# Patient Record
Sex: Female | Born: 1945 | Race: White | Hispanic: No | Smoking: Current every day smoker
Health system: Southern US, Community
[De-identification: ages and names within clinical notes are randomized; demographics above are authoritative.]

## PROBLEM LIST (undated history)

## (undated) DIAGNOSIS — M810 Age-related osteoporosis without current pathological fracture: Secondary | ICD-10-CM

## (undated) DIAGNOSIS — H9190 Unspecified hearing loss, unspecified ear: Secondary | ICD-10-CM

## (undated) DIAGNOSIS — F329 Major depressive disorder, single episode, unspecified: Secondary | ICD-10-CM

## (undated) DIAGNOSIS — J3089 Other allergic rhinitis: Secondary | ICD-10-CM

## (undated) DIAGNOSIS — E119 Type 2 diabetes mellitus without complications: Secondary | ICD-10-CM

## (undated) DIAGNOSIS — F32A Depression, unspecified: Secondary | ICD-10-CM

## (undated) DIAGNOSIS — I10 Essential (primary) hypertension: Secondary | ICD-10-CM

## (undated) DIAGNOSIS — K219 Gastro-esophageal reflux disease without esophagitis: Secondary | ICD-10-CM

## (undated) DIAGNOSIS — R2689 Other abnormalities of gait and mobility: Secondary | ICD-10-CM

## (undated) DIAGNOSIS — E785 Hyperlipidemia, unspecified: Secondary | ICD-10-CM

## (undated) DIAGNOSIS — K5792 Diverticulitis of intestine, part unspecified, without perforation or abscess without bleeding: Secondary | ICD-10-CM

## (undated) DIAGNOSIS — M1990 Unspecified osteoarthritis, unspecified site: Secondary | ICD-10-CM

## (undated) DIAGNOSIS — R42 Dizziness and giddiness: Secondary | ICD-10-CM

## (undated) DIAGNOSIS — K589 Irritable bowel syndrome without diarrhea: Secondary | ICD-10-CM

## (undated) DIAGNOSIS — F419 Anxiety disorder, unspecified: Secondary | ICD-10-CM

## (undated) DIAGNOSIS — M5431 Sciatica, right side: Secondary | ICD-10-CM

## (undated) HISTORY — DX: Sciatica, right side: M54.31

## (undated) HISTORY — DX: Hyperlipidemia, unspecified: E78.5

## (undated) HISTORY — PX: NO PAST SURGERIES: SHX2092

## (undated) HISTORY — DX: Anxiety disorder, unspecified: F41.9

## (undated) HISTORY — DX: Diverticulitis of intestine, part unspecified, without perforation or abscess without bleeding: K57.92

## (undated) HISTORY — DX: Depression, unspecified: F32.A

## (undated) HISTORY — DX: Major depressive disorder, single episode, unspecified: F32.9

## (undated) HISTORY — DX: Essential (primary) hypertension: I10

---
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
## 2015-04-12 DIAGNOSIS — I10 Essential (primary) hypertension: Secondary | ICD-10-CM | POA: Diagnosis not present

## 2015-04-12 DIAGNOSIS — E782 Mixed hyperlipidemia: Secondary | ICD-10-CM | POA: Diagnosis not present

## 2015-04-12 DIAGNOSIS — F411 Generalized anxiety disorder: Secondary | ICD-10-CM | POA: Diagnosis not present

## 2015-05-28 DIAGNOSIS — S20162A Insect bite (nonvenomous) of breast, left breast, initial encounter: Secondary | ICD-10-CM | POA: Diagnosis not present

## 2015-05-28 DIAGNOSIS — I781 Nevus, non-neoplastic: Secondary | ICD-10-CM | POA: Diagnosis not present

## 2015-10-15 DIAGNOSIS — I10 Essential (primary) hypertension: Secondary | ICD-10-CM | POA: Diagnosis not present

## 2015-10-15 DIAGNOSIS — E782 Mixed hyperlipidemia: Secondary | ICD-10-CM | POA: Diagnosis not present

## 2015-10-15 DIAGNOSIS — F411 Generalized anxiety disorder: Secondary | ICD-10-CM | POA: Diagnosis not present

## 2016-03-06 DIAGNOSIS — I10 Essential (primary) hypertension: Secondary | ICD-10-CM | POA: Diagnosis not present

## 2016-03-06 DIAGNOSIS — F411 Generalized anxiety disorder: Secondary | ICD-10-CM | POA: Diagnosis not present

## 2016-06-04 DIAGNOSIS — F411 Generalized anxiety disorder: Secondary | ICD-10-CM | POA: Diagnosis not present

## 2016-06-04 DIAGNOSIS — I10 Essential (primary) hypertension: Secondary | ICD-10-CM | POA: Diagnosis not present

## 2016-06-04 DIAGNOSIS — R7309 Other abnormal glucose: Secondary | ICD-10-CM | POA: Diagnosis not present

## 2016-06-04 DIAGNOSIS — Z0000 Encounter for general adult medical examination without abnormal findings: Secondary | ICD-10-CM | POA: Diagnosis not present

## 2016-06-04 DIAGNOSIS — E782 Mixed hyperlipidemia: Secondary | ICD-10-CM | POA: Diagnosis not present

## 2016-06-05 LAB — CBC AND DIFFERENTIAL
HCT: 43 (ref 36–46)
Hemoglobin: 14.1 (ref 12.0–16.0)
Platelets: 200 (ref 150–399)
WBC: 4.7

## 2016-06-05 LAB — HEPATIC FUNCTION PANEL
ALK PHOS: 101 (ref 25–125)
ALT: 24 (ref 7–35)
AST: 28 (ref 13–35)
BILIRUBIN, TOTAL: 7.4

## 2016-06-05 LAB — BASIC METABOLIC PANEL
BUN: 4 (ref 4–21)
CREATININE: 0.6 (ref 0.5–1.1)
GLUCOSE: 93
Potassium: 4.3 (ref 3.4–5.3)
Sodium: 218 — AB (ref 137–147)

## 2016-06-05 LAB — VITAMIN D 25 HYDROXY (VIT D DEFICIENCY, FRACTURES): VIT D 25 HYDROXY: 49.1

## 2016-06-05 LAB — LIPID PANEL
Cholesterol: 197 (ref 0–200)
HDL: 108 — AB (ref 35–70)
LDL Cholesterol: 55
TRIGLYCERIDES: 91 (ref 40–160)

## 2016-06-05 LAB — TSH: TSH: 0.71 (ref 0.41–5.90)

## 2016-12-01 ENCOUNTER — Ambulatory Visit (INDEPENDENT_AMBULATORY_CARE_PROVIDER_SITE_OTHER): Payer: Medicare Other | Admitting: Family Medicine

## 2016-12-01 ENCOUNTER — Encounter: Payer: Self-pay | Admitting: Family Medicine

## 2016-12-01 VITALS — BP 142/102 | HR 92 | Temp 98.4°F | Resp 16 | Ht <= 58 in | Wt 94.0 lb

## 2016-12-01 DIAGNOSIS — Z720 Tobacco use: Secondary | ICD-10-CM | POA: Diagnosis not present

## 2016-12-01 DIAGNOSIS — F332 Major depressive disorder, recurrent severe without psychotic features: Secondary | ICD-10-CM

## 2016-12-01 DIAGNOSIS — F419 Anxiety disorder, unspecified: Secondary | ICD-10-CM

## 2016-12-01 DIAGNOSIS — Z23 Encounter for immunization: Secondary | ICD-10-CM

## 2016-12-01 DIAGNOSIS — Z1159 Encounter for screening for other viral diseases: Secondary | ICD-10-CM | POA: Diagnosis not present

## 2016-12-01 DIAGNOSIS — Z7689 Persons encountering health services in other specified circumstances: Secondary | ICD-10-CM

## 2016-12-01 DIAGNOSIS — F411 Generalized anxiety disorder: Secondary | ICD-10-CM | POA: Insufficient documentation

## 2016-12-01 DIAGNOSIS — I10 Essential (primary) hypertension: Secondary | ICD-10-CM | POA: Diagnosis not present

## 2016-12-01 DIAGNOSIS — E785 Hyperlipidemia, unspecified: Secondary | ICD-10-CM | POA: Diagnosis not present

## 2016-12-01 DIAGNOSIS — F329 Major depressive disorder, single episode, unspecified: Secondary | ICD-10-CM | POA: Insufficient documentation

## 2016-12-01 MED ORDER — ALPRAZOLAM 0.5 MG PO TABS: 0.2500 mg | ORAL_TABLET | Freq: Two times a day (BID) | ORAL | 1 refills | Status: DC | PRN

## 2016-12-01 MED ORDER — SIMVASTATIN 20 MG PO TABS: 20.0000 mg | ORAL_TABLET | Freq: Every day | ORAL | 1 refills | Status: DC

## 2016-12-01 MED ORDER — NICOTINE 14 MG/24HR TD PT24: 14.0000 mg | MEDICATED_PATCH | Freq: Every day | TRANSDERMAL | 1 refills | Status: DC

## 2016-12-01 MED ORDER — PAROXETINE HCL 10 MG PO TABS: ORAL_TABLET | ORAL | 1 refills | Status: DC

## 2016-12-01 NOTE — Patient Instructions (Addendum)
1-800-QUIT-NOW   Steps to Quit Smoking Smoking tobacco can be harmful to your health and can affect almost every organ in your body. Smoking puts you, and those around you, at risk for developing many serious chronic diseases. Quitting smoking is difficult, but it is one of the best things that you can do for your health. It is never too late to quit. What are the benefits of quitting smoking? When you quit smoking, you lower your risk of developing serious diseases and conditions, such as:  Lung cancer or lung disease, such as COPD.  Heart disease.  Stroke.  Heart attack.  Infertility.  Osteoporosis and bone fractures.  Additionally, symptoms such as coughing, wheezing, and shortness of breath may get better when you quit. You may also find that you get sick less often because your body is stronger at fighting off colds and infections. If you are pregnant, quitting smoking can help to reduce your chances of having a baby of low birth weight. How do I get ready to quit? When you decide to quit smoking, create a plan to make sure that you are successful. Before you quit:  Pick a date to quit. Set a date within the next two weeks to give you time to prepare.  Write down the reasons why you are quitting. Keep this list in places where you will see it often, such as on your bathroom mirror or in your car or wallet.  Identify the people, places, things, and activities that make you want to smoke (triggers) and avoid them. Make sure to take these actions: ? Throw away all cigarettes at home, at work, and in your car. ? Throw away smoking accessories, such as Scientist, research (medical). ? Clean your car and make sure to empty the ashtray. ? Clean your home, including curtains and carpets.  Tell your family, friends, and coworkers that you are quitting. Support from your loved ones can make quitting easier.  Talk with your health care provider about your options for quitting smoking.  Find  out what treatment options are covered by your health insurance.  What strategies can I use to quit smoking? Talk with your healthcare provider about different strategies to quit smoking. Some strategies include:  Quitting smoking altogether instead of gradually lessening how much you smoke over a period of time. Research shows that quitting "cold [place]" is more successful than gradually quitting.  Attending in-person counseling to help you build problem-solving skills. You are more likely to have success in quitting if you attend several counseling sessions. Even short sessions of 10 minutes can be effective.  Finding resources and support systems that can help you to quit smoking and remain smoke-free after you quit. These resources are most helpful when you use them often. They can include: ? Online chats with a Social worker. ? Telephone quitlines. ? Careers information officer. ? Support groups or group counseling. ? Text messaging programs. ? Mobile phone applications.  Taking medicines to help you quit smoking. (If you are pregnant or breastfeeding, talk with your health care provider first.) Some medicines contain nicotine and some do not. Both types of medicines help with cravings, but the medicines that include nicotine help to relieve withdrawal symptoms. Your health care provider may recommend: ? Nicotine patches, gum, or lozenges. ? Nicotine inhalers or sprays. ? Non-nicotine medicine that is taken by mouth.  Talk with your health care provider about combining strategies, such as taking medicines while you are also receiving in-person counseling. Using these  two strategies together makes you more likely to succeed in quitting than if you used either strategy on its own. If you are pregnant or breastfeeding, talk with your health care provider about finding counseling or other support strategies to quit smoking. Do not take medicine to help you quit smoking unless told to do so by  your health care provider. What things can I do to make it easier to quit? Quitting smoking might feel overwhelming at first, but there is a lot that you can do to make it easier. Take these important actions:  Reach out to your family and friends and ask that they support and encourage you during this time. Call telephone quitlines, reach out to support groups, or work with a counselor for support.  Ask people who smoke to avoid smoking around you.  Avoid places that trigger you to smoke, such as bars, parties, or smoke-break areas at work.  Spend time around people who do not smoke.  Lessen stress in your life, because stress can be a smoking trigger for some people. To lessen stress, try: ? Exercising regularly. ? Deep-breathing exercises. ? Yoga. ? Meditating. ? Performing a body scan. This involves closing your eyes, scanning your body from head to toe, and noticing which parts of your body are particularly tense. Purposefully relax the muscles in those areas.  Download or purchase mobile phone or tablet apps (applications) that can help you stick to your quit plan by providing reminders, tips, and encouragement. There are many free apps, such as QuitGuide from the [employer] Office manager for Disease Control and Prevention). You can find other support for quitting smoking (smoking cessation) through smokefree.gov and other websites.  How will I feel when I quit smoking? Within the first 24 hours of quitting smoking, you may start to feel some withdrawal symptoms. These symptoms are usually most noticeable 2-3 days after quitting, but they usually do not last beyond 2-3 weeks. Changes or symptoms that you might experience include:  Mood swings.  Restlessness, anxiety, or irritation.  Difficulty concentrating.  Dizziness.  Strong cravings for sugary foods in addition to nicotine.  Mild weight gain.  Constipation.  Nausea.  Coughing or a sore throat.  Changes in how your medicines  work in your body.  A depressed mood.  Difficulty sleeping (insomnia).  After the first 2-3 weeks of quitting, you may start to notice more positive results, such as:  Improved sense of smell and taste.  Decreased coughing and sore throat.  Slower heart rate.  Lower blood pressure.  Clearer skin.  The ability to breathe more easily.  Fewer sick days.  Quitting smoking is very challenging for most people. Do not get discouraged if you are not successful the first time. Some people need to make many attempts to quit before they achieve long-term success. Do your best to stick to your quit plan, and talk with your health care provider if you have any questions or concerns. This information is not intended to replace advice given to you by your health care provider. Make sure you discuss any questions you have with your health care provider. Document Released: 01/05/2001 Document Revised: 09/09/2015 Document Reviewed: 05/28/2014 Elsevier Interactive Patient Education  2017 [employer].
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
## 2016-12-02 NOTE — Assessment & Plan Note (Signed)
Uncontrolled, but may have some white coat component Continue lisinopril at current dose Recheck at next visit and consider dose titration

## 2016-12-02 NOTE — Assessment & Plan Note (Signed)
Poorly controlled D/c wellbutrin as this could be increasing anxiety Patient agrees to try an SSRI Discussed that it can take 6-8 weeks to reach maximum efficacy, potential side effects including sexual dysfunction, GI upset, risk of increased suicidality Patient is non-a danger to herself or others Check for other medical causes of depression, anxiety with TSH, CBC, CMP Follow-up in one month

## 2016-12-02 NOTE — Assessment & Plan Note (Signed)
Continue Zocor Recheck lipids, CMP

## 2016-12-02 NOTE — Assessment & Plan Note (Signed)
Long discussion about consequence of smoking, benefits of quitting, quitting strategies Patient currently on wellbutrin which will be d/c'd (see below) and has not helped with cessation Tried chantix in the past that caused nightmares Agrees to try patches Quitline number given F/u in 1 month

## 2016-12-14 DIAGNOSIS — I10 Essential (primary) hypertension: Secondary | ICD-10-CM | POA: Diagnosis not present

## 2016-12-14 DIAGNOSIS — E785 Hyperlipidemia, unspecified: Secondary | ICD-10-CM | POA: Diagnosis not present

## 2016-12-14 DIAGNOSIS — Z1159 Encounter for screening for other viral diseases: Secondary | ICD-10-CM | POA: Diagnosis not present

## 2016-12-14 DIAGNOSIS — F419 Anxiety disorder, unspecified: Secondary | ICD-10-CM | POA: Diagnosis not present

## 2016-12-15 ENCOUNTER — Telehealth: Payer: Self-pay | Admitting: Family Medicine

## 2016-12-15 LAB — CBC
HCT: 38.1 % (ref 35.0–45.0)
Hemoglobin: 13.2 g/dL (ref 11.7–15.5)
MCH: 30.9 pg (ref 27.0–33.0)
MCHC: 34.6 g/dL (ref 32.0–36.0)
MCV: 89.2 fL (ref 80.0–100.0)
MPV: 11.2 fL (ref 7.5–12.5)
PLATELETS: 209 10*3/uL (ref 140–400)
RBC: 4.27 10*6/uL (ref 3.80–5.10)
RDW: 12.1 % (ref 11.0–15.0)
WBC: 4.1 10*3/uL (ref 3.8–10.8)

## 2016-12-15 LAB — COMPLETE METABOLIC PANEL WITH GFR
AG RATIO: 1.5 (calc) (ref 1.0–2.5)
ALT: 18 U/L (ref 6–29)
AST: 18 U/L (ref 10–35)
Albumin: 4.2 g/dL (ref 3.6–5.1)
Alkaline phosphatase (APISO): 89 U/L (ref 33–130)
BUN: 8 mg/dL (ref 7–25)
CALCIUM: 9.2 mg/dL (ref 8.6–10.4)
CO2: 30 mmol/L (ref 20–32)
Chloride: 101 mmol/L (ref 98–110)
Creat: 0.62 mg/dL (ref 0.60–0.93)
GFR, EST NON AFRICAN AMERICAN: 90 mL/min/{1.73_m2} (ref >=60)
GFR, Est African American: 104 mL/min/{1.73_m2} (ref >=60)
Globulin: 2.8 g/dL (calc) (ref 1.9–3.7)
Glucose, Bld: 90 mg/dL (ref 65–99)
POTASSIUM: 4 mmol/L (ref 3.5–5.3)
Sodium: 139 mmol/L (ref 135–146)
Total Bilirubin: 0.7 mg/dL (ref 0.2–1.2)
Total Protein: 7 g/dL (ref 6.1–8.1)

## 2016-12-15 LAB — LIPID PANEL
CHOL/HDL RATIO: 2.5 (calc) (ref <5.0)
CHOLESTEROL: 199 mg/dL (ref <200)
HDL: 80 mg/dL (ref >50)
LDL CHOLESTEROL (CALC): 97 mg/dL
Non-HDL Cholesterol (Calc): 119 mg/dL (calc) (ref <130)
Triglycerides: 121 mg/dL (ref <150)

## 2016-12-15 LAB — HEPATITIS C ANTIBODY
HEP C AB: NONREACTIVE
SIGNAL TO CUT-OFF: 0.07 (ref <1.00)

## 2016-12-15 LAB — TSH: TSH: 0.71 m[IU]/L (ref 0.40–4.50)

## 2016-12-15 NOTE — Progress Notes (Signed)
Advised 

## 2016-12-15 NOTE — Telephone Encounter (Signed)
Pt returned call for lab results. Thanks TNP °

## 2017-01-03 ENCOUNTER — Ambulatory Visit: Payer: Self-pay | Admitting: Family Medicine

## 2017-01-20 ENCOUNTER — Other Ambulatory Visit: Payer: Self-pay

## 2017-01-20 ENCOUNTER — Encounter: Payer: Self-pay | Admitting: Family Medicine

## 2017-01-20 ENCOUNTER — Ambulatory Visit (INDEPENDENT_AMBULATORY_CARE_PROVIDER_SITE_OTHER): Payer: Medicare Other | Admitting: Family Medicine

## 2017-01-20 VITALS — BP 136/88 | HR 77 | Temp 98.1°F | Resp 16 | Wt 97.0 lb

## 2017-01-20 DIAGNOSIS — I10 Essential (primary) hypertension: Secondary | ICD-10-CM | POA: Diagnosis not present

## 2017-01-20 DIAGNOSIS — F419 Anxiety disorder, unspecified: Secondary | ICD-10-CM

## 2017-01-20 DIAGNOSIS — Z720 Tobacco use: Secondary | ICD-10-CM | POA: Diagnosis not present

## 2017-01-20 DIAGNOSIS — F332 Major depressive disorder, recurrent severe without psychotic features: Secondary | ICD-10-CM | POA: Diagnosis not present

## 2017-01-20 MED ORDER — PAROXETINE HCL 40 MG PO TABS
40.0000 mg | ORAL_TABLET | Freq: Every day | ORAL | 2 refills | Status: DC
Start: 2017-01-20 — End: 2017-03-07

## 2017-01-20 MED ORDER — ALPRAZOLAM 0.5 MG PO TABS: 0.2500 mg | ORAL_TABLET | Freq: Two times a day (BID) | ORAL | 1 refills | Status: DC | PRN

## 2017-01-20 NOTE — Patient Instructions (Addendum)
Increase Paxil to 40mg  daily No changes to other medicines  Insomnia Insomnia is a sleep disorder that makes it difficult to fall asleep or to stay asleep. Insomnia can cause tiredness (fatigue), low energy, difficulty concentrating, mood swings, and poor performance at work or school. There are three different ways to classify insomnia:  Difficulty falling asleep.  Difficulty staying asleep.  Waking up too early in the morning.  Any type of insomnia can be long-term (chronic) or short-term (acute). Both are common. Short-term insomnia usually lasts for three months or less. Chronic insomnia occurs at least three times a week for longer than three months. What are the causes? Insomnia may be caused by another condition, situation, or substance, such as:  Anxiety.  Certain medicines.  Gastroesophageal reflux disease (GERD) or other gastrointestinal conditions.  Asthma or other breathing conditions.  Restless legs syndrome, sleep apnea, or other sleep disorders.  Chronic pain.  Menopause. This may include hot flashes.  Stroke.  Abuse of alcohol, tobacco, or illegal drugs.  Depression.  Caffeine.  Neurological disorders, such as Alzheimer disease.  An overactive thyroid (hyperthyroidism).  The cause of insomnia may not be known. What increases the risk? Risk factors for insomnia include:  Gender. Women are more commonly affected than men.  Age. Insomnia is more common as you get older.  Stress. This may involve your professional or personal life.  Income. Insomnia is more common in people with lower income.  Lack of exercise.  Irregular work schedule or night shifts.  Traveling between different time zones.  What are the signs or symptoms? If you have insomnia, trouble falling asleep or trouble staying asleep is the main symptom. This may lead to other symptoms, such as:  Feeling fatigued.  Feeling nervous about going to sleep.  Not feeling rested in  the morning.  Having trouble concentrating.  Feeling irritable, anxious, or depressed.  How is this treated? Treatment for insomnia depends on the cause. If your insomnia is caused by an underlying condition, treatment will focus on addressing the condition. Treatment may also include:  Medicines to help you sleep.  Counseling or therapy.  Lifestyle adjustments.  Follow these instructions at home:  Take medicines only as directed by your health care provider.  Keep regular sleeping and waking hours. Avoid naps.  Keep a sleep diary to help you and your health care provider figure out what could be causing your insomnia. Include: ? When you sleep. ? When you wake up during the night. ? How well you sleep. ? How rested you feel the next day. ? Any side effects of medicines you are taking. ? What you eat and drink.  Make your bedroom a comfortable place where it is easy to fall asleep: ? Put up shades or special blackout curtains to block light from outside. ? Use a white noise machine to block noise. ? Keep the temperature cool.  Exercise regularly as directed by your health care provider. Avoid exercising right before bedtime.  Use relaxation techniques to manage stress. Ask your health care provider to suggest some techniques that may work well for you. These may include: ? Breathing exercises. ? Routines to release muscle tension. ? Visualizing peaceful scenes.  Cut back on alcohol, caffeinated beverages, and cigarettes, especially close to bedtime. These can disrupt your sleep.  Do not overeat or eat spicy foods right before bedtime. This can lead to digestive discomfort that can make it hard for you to sleep.  Limit screen use before bedtime.  This includes: ? Watching TV. ? Using your smartphone, tablet, and computer.  Stick to a routine. This can help you fall asleep faster. Try to do a quiet activity, brush your teeth, and go to bed at the same time each  night.  Get out of bed if you are still awake after 15 minutes of trying to sleep. Keep the lights down, but try reading or doing a quiet activity. When you feel sleepy, go back to bed.  Make sure that you drive carefully. Avoid driving if you feel very sleepy.  Keep all follow-up appointments as directed by your health care provider. This is important. Contact a health care provider if:  You are tired throughout the day or have trouble in your daily routine due to sleepiness.  You continue to have sleep problems or your sleep problems get worse. Get help right away if:  You have serious thoughts about hurting yourself or someone else. This information is not intended to replace advice given to you by your health care provider. Make sure you discuss any questions you have with your health care provider. Document Released: 01/09/2000 Document Revised: 06/13/2015 Document Reviewed: 10/12/2013 Elsevier Interactive Patient Education  [employer].

## 2017-01-20 NOTE — Assessment & Plan Note (Signed)
Congratulated patient on smoking cessation efforts Has cut back significantly on tobacco intake and wants to quit by time of f/u

## 2017-01-20 NOTE — Assessment & Plan Note (Signed)
Well controlled today Continue lisinopril Suspect some component of white coat HTN at last visit Recent BMP wnl

## 2017-01-20 NOTE — Assessment & Plan Note (Signed)
Control improving on Paxil No side effects from paxil Recent labs wnl Increase Paxil to 40mg  daily as above Discussed long term risks of chronic benzos Will continue Xanax currently, with plans to decrease and d/c in the future if well controlled on SSRI Would likely benefit from therapy as well

## 2017-01-20 NOTE — Progress Notes (Signed)
Patient: Amanda Tucker Female    DOB: Jan 20, 1946   71 y.o.   MRN: [id number] Visit Date: 01/20/2017  Today's Provider: [name], MD   I, Martha Clan, CMA, am acting as scribe for [name], MD.  Chief Complaint  Patient presents with  . Hypertension  . Depression  . Nicotine Dependence   Subjective:    HPI      Hypertension, follow-up:  BP Readings from Last 3 Encounters:  01/20/17 136/88  12/01/16 (!) 142/102    She was last seen for hypertension 4 weeks ago.  BP at that visit was 142/102. Management since that visit includes none; was believed to be elevated due to white coat syndrome. BP going to be rechecked today. If still elevated, PCP wanted to titrate dose of Lisinopril. She is exercising. Yoga, stretches, frequent stair climbing. She is adherent to low salt diet.   Outside blood pressures are WNL per pt. She is experiencing none.  Patient denies chest pain, chest pressure/discomfort, claudication, dyspnea, exertional chest pressure/discomfort, fatigue, irregular heart beat, lower extremity edema, near-syncope, orthopnea, palpitations and syncope.   Cardiovascular risk factors include advanced age (older than 34 for men, 64 for women), hypertension and smoking/ tobacco exposure.  Use of agents associated with hypertension: none.     Weight trend: increasing steadily Wt Readings from Last 3 Encounters:  01/20/17 97 lb (44 kg)  12/01/16 94 lb (42.6 kg)    Current diet: "not a heavy eater"  ------------------------------------------------------------------------  Depression, Follow-up  She  was last seen for this 4 weeks ago. Changes made at last visit include D/C Wellbutrin, as this was believed to be worsening her anxiety. Pt was started on Paxil.   She reports good compliance with treatment. She is not having side effects.   She reports good tolerance of treatment. Current symptoms include: insomnia She feels she is  Improved since last visit.  Pt states her anxiety is also improved, but she is still having "anxiety attacks", though she denies panic. She is taking 1/2 to 1 Alprazolam qhs to improve sleep. She also took this medication today during an anxiety attack.  Has tried several OTC sleep aides (Advil PM, Tylenol PM, etc) - haven't helped with sleep.  Xanax seemed to help with her sleep.    Depression screen [facility] 2/9 01/20/2017 12/01/2016  Decreased Interest 0 1  Down, Depressed, Hopeless 1 2  PHQ - 2 Score 1 3  Altered sleeping 3 2  Tired, decreased energy 1 1  Change in appetite 1 2  Feeling bad or failure about yourself  1 2  Trouble concentrating 0 0  Moving slowly or fidgety/restless 1 2  Suicidal thoughts 0 0  PHQ-9 Score 8 12  Difficult doing work/chores Somewhat difficult Not difficult at all   GAD 7 : Generalized Anxiety Score 01/20/2017 12/01/2016  Nervous, Anxious, on Edge 1 2  Control/stop worrying 1 2  Worry too much - different things 1 3  Trouble relaxing 1 3  Restless 0 3  Easily annoyed or irritable 0 0  Afraid - awful might happen 0 3  Total GAD 7 Score 4 16  Anxiety Difficulty Not difficult at all Somewhat difficult    ------------------------------------------------------------------------   Follow up for Tobacco Abuse  The patient was last seen for this 4 weeks ago. Changes made at last visit include advising pt to use nicotine patch. Wellbutrin was ineffective and Chantix caused nightmares.  She reports excellent compliance with treatment.  Is cutting down to level 3 of nicotine patches tomorrow. Has cut the habit of her morning cigarette by doing yoga and staying in room longer in the mornings. She feels that condition is Improved. She is now only smoking 2-3 cigarettes per day (down from 1 PPD) and not smoking entire cigarette at a time. She is not having side effects.    ------------------------------------------------------------------------------------    No Known Allergies   Current Outpatient Medications:  .  ALPRAZolam (XANAX) 0.5 MG tablet, Take 0.5-1 tablets (0.25-0.5 mg total) by mouth 2 (two) times daily as needed for anxiety., Disp: 30 tablet, Rfl: 1 .  aspirin EC 81 MG tablet, Take 81 mg daily by mouth., Disp: , Rfl:  .  lisinopril (PRINIVIL,ZESTRIL) 20 MG tablet, TK 1 T PO ONCE D, Disp: , Rfl: 1 .  nicotine (NICODERM CQ - DOSED IN MG/24 HOURS) 14 mg/24hr patch, Place 1 patch (14 mg total) daily onto the skin., Disp: 28 patch, Rfl: 1 .  PARoxetine (PAXIL) 40 MG tablet, Take 1 tablet (40 mg total) by mouth daily., Disp: 30 tablet, Rfl: 2 .  simvastatin (ZOCOR) 20 MG tablet, Take 1 tablet (20 mg total) daily at 6 PM by mouth., Disp: 90 tablet, Rfl: 1  Review of Systems  Constitutional: Negative for fatigue.  Respiratory: Negative for shortness of breath.   Cardiovascular: Negative for chest pain, palpitations and leg swelling.  Psychiatric/Behavioral: Positive for sleep disturbance. Negative for confusion and decreased concentration. The patient is nervous/anxious.     Social History   Tobacco Use  . Smoking status: Current Every Day Smoker    Packs/day: 0.25    Years: 54.00    Pack years: 13.50    Types: Cigarettes  . Smokeless tobacco: Never Used  . Tobacco comment: started smoking at age 39; smoking 2-3 cigarettes per day  Substance Use Topics  . Alcohol use: Yes    Alcohol/week: 3.0 oz    Types: 5 Glasses of wine per week   Objective:   BP 136/88 (BP Location: Left Arm, Patient Position: Sitting, Cuff Size: Normal)   Pulse 77   Temp 98.1 F (36.7 C) (Oral)   Resp 16   Wt 97 lb (44 kg)   SpO2 99%   BMI 21.75 kg/m  Vitals:   01/20/17 1605  BP: 136/88  Pulse: 77  Resp: 16  Temp: 98.1 F (36.7 C)  TempSrc: Oral  SpO2: 99%  Weight: 97 lb (44 kg)     Physical Exam  Constitutional: She is oriented to person,  place, and time. She appears well-developed and well-nourished. No distress.  HENT:  Head: Normocephalic and atraumatic.  Neck: Neck supple. No thyromegaly present.  Cardiovascular: Normal rate, regular rhythm, normal heart sounds and intact distal pulses.  No murmur heard. Pulmonary/Chest: Effort normal and breath sounds normal. No respiratory distress. She has no wheezes. She has no rales.  Musculoskeletal: She exhibits no edema or deformity.  Lymphadenopathy:    She has no cervical adenopathy.  Neurological: She is alert and oriented to person, place, and time. No cranial nerve deficit.  Skin: Skin is warm and dry. No rash noted.  Psychiatric: She has a normal mood and affect. Her behavior is normal.  Vitals reviewed.       Assessment & Plan:      Problem List Items Addressed This Visit      Cardiovascular and Mediastinum   Hypertension    Well controlled today Continue lisinopril Suspect some component of white coat  HTN at last visit Recent BMP wnl        Other   Depression - Primary    Control improving off welbutrin on Paxil Doing well on Paxil without side effects Will increase Paxil to 40mg  daily F/u in 6 weeks      Relevant Medications   PARoxetine (PAXIL) 40 MG tablet   ALPRAZolam (XANAX) 0.5 MG tablet   Anxiety    Control improving on Paxil No side effects from paxil Recent labs wnl Increase Paxil to 40mg  daily as above Discussed long term risks of chronic benzos Will continue Xanax currently, with plans to decrease and d/c in the future if well controlled on SSRI Would likely benefit from therapy as well      Relevant Medications   PARoxetine (PAXIL) 40 MG tablet   ALPRAZolam (XANAX) 0.5 MG tablet   Tobacco abuse    Congratulated patient on smoking cessation efforts Has cut back significantly on tobacco intake and wants to quit by time of f/u         Return in about 6 weeks (around 03/03/2017) for insomnia, anxiety.     The entirety of the  information documented in the History of Present Illness, Review of Systems and Physical Exam were personally obtained by me. Portions of this information were initially documented by Raquel Sarna Ratchford, CMA and reviewed by me for thoroughness and accuracy.    Virginia Crews, MD, MPH [facility] 01/20/2017 4:48 PM

## 2017-01-20 NOTE — Assessment & Plan Note (Signed)
Control improving off welbutrin on Paxil Doing well on Paxil without side effects Will increase Paxil to 40mg  daily F/u in 6 weeks

## 2017-03-07 ENCOUNTER — Ambulatory Visit (INDEPENDENT_AMBULATORY_CARE_PROVIDER_SITE_OTHER): Payer: Medicare Other | Admitting: Family Medicine

## 2017-03-07 ENCOUNTER — Encounter: Payer: Self-pay | Admitting: Family Medicine

## 2017-03-07 VITALS — BP 140/90 | HR 79 | Temp 98.1°F | Resp 16 | Wt 98.0 lb

## 2017-03-07 DIAGNOSIS — F331 Major depressive disorder, recurrent, moderate: Secondary | ICD-10-CM | POA: Diagnosis not present

## 2017-03-07 DIAGNOSIS — I10 Essential (primary) hypertension: Secondary | ICD-10-CM | POA: Diagnosis not present

## 2017-03-07 DIAGNOSIS — F419 Anxiety disorder, unspecified: Secondary | ICD-10-CM

## 2017-03-07 DIAGNOSIS — G4700 Insomnia, unspecified: Secondary | ICD-10-CM | POA: Diagnosis not present

## 2017-03-07 MED ORDER — PAROXETINE HCL 40 MG PO TABS: 40.0000 mg | ORAL_TABLET | Freq: Every day | ORAL | 3 refills | Status: DC

## 2017-03-07 MED ORDER — ALPRAZOLAM 0.5 MG PO TABS: 0.2500 mg | ORAL_TABLET | Freq: Every evening | ORAL | 2 refills | Status: DC | PRN

## 2017-03-07 NOTE — Assessment & Plan Note (Signed)
Much improved, well controlled Continue paxil at current dose F/u in 3 months

## 2017-03-07 NOTE — Progress Notes (Signed)
Patient: Amanda Tucker Female    DOB: 06-Feb-1945   72 y.o.   MRN: [id number] Visit Date: 03/07/2017  Today's Provider: [name], MD   I, Martha Clan, CMA, am acting as scribe for [name], MD.  Chief Complaint  Patient presents with  . Depression  . Anxiety   Subjective:    Depression         Chronicity: FU from 01/20/2017; depression was improving off of Wellbutrin and on Paxil. Paxil was increased to 40 mg at [place].  The problem has been gradually improving since onset.  Associated symptoms include fatigue, insomnia (well controlled on Xanax (takes 1 tab po qhs PRN for this. States she takes this "most nights")  ), appetite change (alternates between decreased and increased appetite) and headaches (intermittent).  Associated symptoms include no decreased concentration, no helplessness, no hopelessness, not irritable, no restlessness, no decreased interest, no body aches, no myalgias, no indigestion, not sad and no suicidal ideas.  Past treatments include SSRIs - Selective serotonin reuptake inhibitors.  Compliance with treatment is good.  Previous treatment provided significant relief.  Past medical history includes anxiety.   Anxiety  Presents for follow-up (LOV 01/20/2017. Pt was advised to continue Xanax with plans to D/C the benzo if she does well on SSRI) visit. Symptoms include insomnia (well controlled on Xanax (takes 1 tab po qhs PRN for this. States she takes this "most nights")  ). Patient reports no chest pain, decreased concentration, depressed mood, dizziness, excessive worry, feeling of choking, irritability, muscle tension, nervous/anxious behavior, palpitations, panic, restlessness, shortness of breath or suicidal ideas. Current severity: mild to moderate.   Compliance with medications: good compliance. Treatment side effects: no side effects.   Patient states her depression and anxiety are much improved, as well as her concentration.  She does need  Xanax most nights for sleep, but does not need it for anxiety anymore.    Depression screen [facility] 2/9 03/07/2017 01/20/2017 12/01/2016  Decreased Interest 0 0 1  Down, Depressed, Hopeless 0 1 2  PHQ - 2 Score 0 1 3  Altered sleeping 3 3 2   Tired, decreased energy 1 1 1   Change in appetite 1 1 2   Feeling bad or failure about yourself  1 1 2   Trouble concentrating 0 0 0  Moving slowly or fidgety/restless 0 1 2  Suicidal thoughts 0 0 0  PHQ-9 Score 6 8 12   Difficult doing work/chores Not difficult at all Somewhat difficult Not difficult at all    GAD 7 : Generalized Anxiety Score 03/07/2017 01/20/2017 12/01/2016  Nervous, Anxious, on Edge 0 1 2  Control/stop worrying 0 1 2  Worry too much - different things 0 1 3  Trouble relaxing 1 1 3   Restless 0 0 3  Easily annoyed or irritable 0 0 0  Afraid - awful might happen 1 0 3  Total GAD 7 Score 2 4 16   Anxiety Difficulty Not difficult at all Not difficult at all Somewhat difficult    HTN: - Medications: lisinopril 20mg  daily - Compliance: good - Checking BP at home: no - Denies any SOB, CP, vision changes, LE edema, medication SEs, or symptoms of hypotension - Diet: realized new sources of hidden sodium in her diet.  She is working on cutting back on sodium intake. - Exercise: no formal exercise  No Known Allergies   Current Outpatient Medications:  .  ALPRAZolam (XANAX) 0.5 MG tablet, Take 0.5-1 tablets (0.25-0.5 mg total)  by mouth at bedtime as needed for anxiety., Disp: 30 tablet, Rfl: 2 .  aspirin EC 81 MG tablet, Take 81 mg daily by mouth., Disp: , Rfl:  .  aspirin-acetaminophen-caffeine (EXCEDRIN MIGRAINE) 250-250-65 MG tablet, Take 2 tablets by mouth as needed for headache., Disp: , Rfl:  .  lisinopril (PRINIVIL,ZESTRIL) 20 MG tablet, TK 1 T PO ONCE D, Disp: , Rfl: 1 .  nicotine (NICODERM CQ - DOSED IN MG/24 HOURS) 14 mg/24hr patch, Place 1 patch (14 mg total) daily onto the skin., Disp: 28 patch, Rfl: 1 .  PARoxetine (PAXIL)  40 MG tablet, Take 1 tablet (40 mg total) by mouth daily., Disp: 90 tablet, Rfl: 3 .  simvastatin (ZOCOR) 20 MG tablet, Take 1 tablet (20 mg total) daily at 6 PM by mouth., Disp: 90 tablet, Rfl: 1  Review of Systems  Constitutional: Positive for appetite change (alternates between decreased and increased appetite) and fatigue. Negative for irritability.  Respiratory: Negative for shortness of breath.   Cardiovascular: Negative for chest pain and palpitations.  Musculoskeletal: Negative for myalgias.  Neurological: Positive for headaches (intermittent). Negative for dizziness.  Psychiatric/Behavioral: Positive for depression. Negative for decreased concentration and suicidal ideas. The patient has insomnia (well controlled on Xanax (takes 1 tab po qhs PRN for this. States she takes this "most nights")  ). The patient is not nervous/anxious.     Social History   Tobacco Use  . Smoking status: Current Every Day Smoker    Packs/day: 0.25    Years: 54.00    Pack years: 13.50    Types: Cigarettes  . Smokeless tobacco: Never Used  . Tobacco comment: started smoking at age 89; smoking about 1 cigarettes throughout the day  Substance Use Topics  . Alcohol use: Yes    Alcohol/week: 3.0 oz    Types: 5 Glasses of wine per week   Objective:   BP 140/90 (BP Location: Right Arm, Patient Position: Sitting, Cuff Size: Normal)   Pulse 79   Temp 98.1 F (36.7 C) (Oral)   Resp 16   Wt 98 lb (44.5 kg)   SpO2 96%   BMI 21.97 kg/m  Vitals:   03/07/17 1104  BP: 140/90  Pulse: 79  Resp: 16  Temp: 98.1 F (36.7 C)  TempSrc: Oral  SpO2: 96%  Weight: 98 lb (44.5 kg)     Physical Exam  Constitutional: She is oriented to person, place, and time. She appears well-developed and well-nourished. She is not irritable. No distress.  HENT:  Head: Normocephalic and atraumatic.  Mouth/Throat: Oropharynx is clear and moist.  Eyes: Conjunctivae are normal. No scleral icterus.  Cardiovascular: Normal  rate, regular rhythm, normal heart sounds and intact distal pulses.  No murmur heard. Pulmonary/Chest: Effort normal and breath sounds normal. No respiratory distress. She has no wheezes. She has no rales.  Musculoskeletal: She exhibits no edema or deformity.  Neurological: She is alert and oriented to person, place, and time.  Skin: Skin is warm and dry. No rash noted.  Psychiatric: She has a normal mood and affect. Her behavior is normal. Judgment and thought content normal.  Vitals reviewed.       Assessment & Plan:      Problem List Items Addressed This Visit      Cardiovascular and Mediastinum   Hypertension    Slightly above goal Patient to work on decreasing sodium intake Will continue lisinopril at current dose  Repeat labs at next visit F/u in 3 months  Other   Depression - Primary    Much improved, well controlled Continue paxil at current dose F/u in 3 months      Relevant Medications   ALPRAZolam (XANAX) 0.5 MG tablet   PARoxetine (PAXIL) 40 MG tablet   Anxiety    Much improved, well controlled Continue paxil at current dose F/u in 3 months See below for discussion of benzo therapy      Relevant Medications   ALPRAZolam (XANAX) 0.5 MG tablet   PARoxetine (PAXIL) 40 MG tablet   Insomnia    Well controlled Patient knows risks of continued benzo use She is taking low dose Xanax 0.25-0.5mg  qhs prn - most nights Discussed risks associated with benzo use in the elderly She declines other therapy options at this time Will continue current Xanax dose without escalation in the future          Return in about 3 months (around 06/04/2017) for chronic disease f/u.   The entirety of the information documented in the History of Present Illness, Review of Systems and Physical Exam were personally obtained by me. Portions of this information were initially documented by Raquel Sarna Ratchford, CMA and reviewed by me for thoroughness and accuracy.     Virginia Crews, MD, MPH [facility] 03/07/2017 1:41 PM

## 2017-03-07 NOTE — Assessment & Plan Note (Signed)
Well controlled Patient knows risks of continued benzo use She is taking low dose Xanax 0.25-0.5mg  qhs prn - most nights Discussed risks associated with benzo use in the elderly She declines other therapy options at this time Will continue current Xanax dose without escalation in the future

## 2017-03-07 NOTE — Assessment & Plan Note (Signed)
Slightly above goal Patient to work on decreasing sodium intake Will continue lisinopril at current dose  Repeat labs at next visit F/u in 3 months

## 2017-03-07 NOTE — Assessment & Plan Note (Signed)
Much improved, well controlled Continue paxil at current dose F/u in 3 months See below for discussion of benzo therapy

## 2017-05-03 ENCOUNTER — Telehealth: Payer: Self-pay | Admitting: Family Medicine

## 2017-05-03 NOTE — Telephone Encounter (Signed)
Faxed ROI to Dr Roland Rack in [place] on 12/08/16

## 2017-06-06 ENCOUNTER — Ambulatory Visit (INDEPENDENT_AMBULATORY_CARE_PROVIDER_SITE_OTHER): Payer: Medicare Other | Admitting: Family Medicine

## 2017-06-06 ENCOUNTER — Encounter: Payer: Self-pay | Admitting: Family Medicine

## 2017-06-06 VITALS — BP 130/80 | HR 83 | Temp 98.4°F | Resp 16 | Wt 98.0 lb

## 2017-06-06 DIAGNOSIS — F419 Anxiety disorder, unspecified: Secondary | ICD-10-CM

## 2017-06-06 DIAGNOSIS — M7121 Synovial cyst of popliteal space [Baker], right knee: Secondary | ICD-10-CM

## 2017-06-06 DIAGNOSIS — I10 Essential (primary) hypertension: Secondary | ICD-10-CM

## 2017-06-06 DIAGNOSIS — F331 Major depressive disorder, recurrent, moderate: Secondary | ICD-10-CM

## 2017-06-06 DIAGNOSIS — G4700 Insomnia, unspecified: Secondary | ICD-10-CM

## 2017-06-06 MED ORDER — SIMVASTATIN 20 MG PO TABS: 20.0000 mg | ORAL_TABLET | Freq: Every day | ORAL | 3 refills | Status: DC

## 2017-06-06 NOTE — Assessment & Plan Note (Signed)
Much improved, well controlled Continue Paxil at current dose Follow-up in 6 months See below for discussion of benzo therapy

## 2017-06-06 NOTE — Assessment & Plan Note (Signed)
Right knee with palpable Baker's cyst Likely secondary to some arthritis given difficulty with deep knee squats Offered x-rays to evaluate degree of osteoarthritis, but patient declines at this time Discussed anti-inflammatories as needed, ice, bracing Return precautions discussed

## 2017-06-06 NOTE — Progress Notes (Signed)
Patient: Amanda Tucker Female    DOB: Jun 11, 1945   72 y.o.   MRN: [id number] Visit Date: 06/06/2017  Today's Provider: [name], MD   Chief Complaint  Patient presents with  . Hypertension  . Depression   Subjective:    HPI      Hypertension, follow-up:  BP Readings from Last 3 Encounters:  06/06/17 130/80  03/07/17 140/90  01/20/17 136/88    She was last seen for hypertension 5 months ago.  BP at that visit was 136/88. Management since that visit includes continuing lisinopril. She reports good compliance with treatment. She is not having side effects.  She is exercising "a little, but not as much as I should. She is adherent to low salt diet.   Outside blood pressures are occasionally being checked. It was 160/90 this morning. It was 130/80 in office. She is experiencing fatigue and lower extremity edema.  Patient denies chest pain, chest pressure/discomfort, claudication, dyspnea, exertional chest pressure/discomfort, irregular heart beat, near-syncope, orthopnea, palpitations and syncope.   Cardiovascular risk factors include advanced age (older than 63 for men, 7 for women), dyslipidemia, hypertension and smoking/ tobacco exposure.   Pt has increased her cigarette use to 3 cigarettes daily. She has tried the Nicotine patches, but the higher dose patches causes irritability. She tolerates the 14 mg/24 hr patch, but she has difficulty finding this dose.   Use of agents associated with hypertension: none.     Weight trend: stable Wt Readings from Last 3 Encounters:  06/06/17 98 lb (44.5 kg)  03/07/17 98 lb (44.5 kg)  01/20/17 97 lb (44 kg)    Current diet: improving; pt is not eating out as much, and is avoiding frozen meals as much as possible.  ------------------------------------------------------------------------  Depression, Follow-up  She  was last seen for this 3 months ago. Changes made at last visit include continuing Paxil and  alprazolam for anxiety/insomnia.   She reports good compliance with treatment. She is not having side effects.  Was able to decrease from 0.5 to 0.25mg  qhs of Xanax.  She will even skip a dose some nights.  She reports good tolerance of treatment. Current symptoms include: fatigue She feels she is Improved since last visit.  ------------------------------------------------------------------------ R knee pain: Patient reports intermittent right knee pain for 2 to 3 months.  The pain is generalized over the entire knee and the back of the knee.  She noticed some popping while she was walking and then swelling along the backside of her knee.  She says there is no pain but is uncomfortable sometimes.  It is more uncomfortable when she is getting out of the car doing deep squats.  She denies any instability, clicking, locking, falls stiffness, fevers, redness.   No Known Allergies   Current Outpatient Medications:  .  ALPRAZolam (XANAX) 0.5 MG tablet, Take 0.5-1 tablets (0.25-0.5 mg total) by mouth at bedtime as needed for anxiety., Disp: 30 tablet, Rfl: 2 .  aspirin EC 81 MG tablet, Take 81 mg daily by mouth., Disp: , Rfl:  .  aspirin-acetaminophen-caffeine (EXCEDRIN MIGRAINE) 250-250-65 MG tablet, Take 2 tablets by mouth as needed for headache., Disp: , Rfl:  .  lisinopril (PRINIVIL,ZESTRIL) 20 MG tablet, TK 1 T PO ONCE D, Disp: , Rfl: 1 .  PARoxetine (PAXIL) 40 MG tablet, Take 1 tablet (40 mg total) by mouth daily., Disp: 90 tablet, Rfl: 3 .  simvastatin (ZOCOR) 20 MG tablet, Take 1 tablet (20 mg total) daily at  6 PM by mouth., Disp: 90 tablet, Rfl: 1  Review of Systems  Constitutional: Positive for fatigue. Negative for activity change, appetite change, chills, diaphoresis, fever and unexpected weight change.  Respiratory: Negative for shortness of breath.   Cardiovascular: Negative for chest pain, palpitations and leg swelling.  Musculoskeletal: Positive for arthralgias (knee).    Psychiatric/Behavioral: Negative for agitation, confusion, decreased concentration, self-injury, sleep disturbance and suicidal ideas. The patient is nervous/anxious.     Social History   Tobacco Use  . Smoking status: Current Every Day Smoker    Packs/day: 0.25    Years: 54.00    Pack years: 13.50    Types: Cigarettes  . Smokeless tobacco: Never Used  . Tobacco comment: started smoking at age 34; smoking about 3 cigarettes throughout the day  Substance Use Topics  . Alcohol use: Yes    Alcohol/week: 3.0 oz    Types: 5 Glasses of wine per week   Objective:   BP 130/80 (BP Location: Left Arm, Patient Position: Sitting, Cuff Size: Normal)   Pulse 83   Temp 98.4 F (36.9 C) (Oral)   Resp 16   Wt 98 lb (44.5 kg)   SpO2 96%   BMI 21.97 kg/m  Vitals:   06/06/17 1056  BP: 130/80  Pulse: 83  Resp: 16  Temp: 98.4 F (36.9 C)  TempSrc: Oral  SpO2: 96%  Weight: 98 lb (44.5 kg)     Physical Exam  Constitutional: She is oriented to person, place, and time. She appears well-developed and well-nourished. No distress.  HENT:  Head: Normocephalic and atraumatic.  Eyes: Conjunctivae are normal. No scleral icterus.  Neck: Neck supple. No thyromegaly present.  Cardiovascular: Normal rate, regular rhythm, normal heart sounds and intact distal pulses.  No murmur heard. Pulmonary/Chest: Effort normal and breath sounds normal. No respiratory distress. She has no wheezes. She has no rales.  Musculoskeletal: She exhibits no edema.  R Knee: No erythema.  Mild swelling.  Palpable swelling along popliteal fossa. Palpation normal with no warmth or joint line tenderness or patellar tenderness or condyle tenderness. ROM normal in flexion and extension and lower leg rotation. Ligaments with solid consistent endpoints including ACL, PCL, LCL, MCL. Negative Mcmurray's and provocative meniscal tests. Non painful patellar compression. Patellar and quadriceps tendons unremarkable. Hamstring and  quadriceps strength is normal. When showing how the pain comes about, she does a very deep squat and then stands up and stands on her left leg while raising her right foot up to her nose  Lymphadenopathy:    She has no cervical adenopathy.  Neurological: She is alert and oriented to person, place, and time.  Skin: Skin is warm and dry. Capillary refill takes less than 2 seconds. No rash noted.  Psychiatric: She has a normal mood and affect. Her behavior is normal.  Vitals reviewed.      Assessment & Plan:   Problem List Items Addressed This Visit      Cardiovascular and Mediastinum   Hypertension - Primary    Well-controlled today Suspect that in the past, it has been elevated due to some whitecoat hypertension  Continue lisinopril at current dose Repeat BMP today      Relevant Medications   simvastatin (ZOCOR) 20 MG tablet   Other Relevant Orders   Basic Metabolic Panel (BMET)     Musculoskeletal and Integument   Baker's cyst of knee, right    Right knee with palpable Baker's cyst Likely secondary to some arthritis given difficulty with  deep knee squats Offered x-rays to evaluate degree of osteoarthritis, but patient declines at this time Discussed anti-inflammatories as needed, ice, bracing Return precautions discussed        Other   Depression    Much improved, well controlled Continue Paxil at current dose Follow-up in 6 months      Anxiety    Much improved, well controlled Continue Paxil at current dose Follow-up in 6 months See below for discussion of benzo therapy      Insomnia    Well-controlled Patient is risk of continued benzo use She has now decreased to Xanax 0.25 mg nightly as needed-most nights Discussed risks associated with benzo use in the elderly On next prescription, will decrease number per refill to 20          Return in about 6 months (around 12/07/2017) for CPE/AWV.   The entirety of the information documented in the History  of Present Illness, Review of Systems and Physical Exam were personally obtained by me. Portions of this information were initially documented by Raquel Sarna Ratchford, CMA and reviewed by me for thoroughness and accuracy.    Virginia Crews, MD, MPH [facility] 06/06/2017 2:44 PM

## 2017-06-06 NOTE — Assessment & Plan Note (Signed)
Much improved, well controlled Continue Paxil at current dose Follow-up in 6 months

## 2017-06-06 NOTE — Assessment & Plan Note (Signed)
Well-controlled Patient is risk of continued benzo use She has now decreased to Xanax 0.25 mg nightly as needed-most nights Discussed risks associated with benzo use in the elderly On next prescription, will decrease number per refill to 20

## 2017-06-06 NOTE — Assessment & Plan Note (Signed)
Well-controlled today Suspect that in the past, it has been elevated due to some whitecoat hypertension  Continue lisinopril at current dose Repeat BMP today

## 2017-07-05 ENCOUNTER — Other Ambulatory Visit: Payer: Self-pay | Admitting: Family Medicine

## 2017-09-14 ENCOUNTER — Other Ambulatory Visit: Payer: Self-pay | Admitting: Family Medicine

## 2017-09-14 MED ORDER — LISINOPRIL 20 MG PO TABS: 20.0000 mg | ORAL_TABLET | Freq: Every day | ORAL | 5 refills | Status: DC

## 2017-09-14 NOTE — Telephone Encounter (Signed)
Pt needs a new refill on her Lisinopril 20 mg  She uses [facility] [facility] [address]  CB# [phone]  Thank Con Memos

## 2017-10-14 ENCOUNTER — Other Ambulatory Visit: Payer: Self-pay | Admitting: Family Medicine

## 2017-10-14 MED ORDER — ALPRAZOLAM 0.5 MG PO TABS: ORAL_TABLET | ORAL | 2 refills | Status: DC

## 2017-10-14 NOTE — Telephone Encounter (Signed)
Pt contacted office for refill request on the following medications:  ALPRAZolam (XANAX) 0.5 MG tablet   [facility] [address]/[place]  Last Rx: 07/05/17 LOV: 06/06/17 Please advise. Thanks TNP

## 2017-11-05 DIAGNOSIS — H5213 Myopia, bilateral: Secondary | ICD-10-CM | POA: Diagnosis not present

## 2017-11-05 DIAGNOSIS — H2513 Age-related nuclear cataract, bilateral: Secondary | ICD-10-CM | POA: Diagnosis not present

## 2017-12-07 ENCOUNTER — Ambulatory Visit (INDEPENDENT_AMBULATORY_CARE_PROVIDER_SITE_OTHER): Payer: Medicare Other | Admitting: Family Medicine

## 2017-12-07 ENCOUNTER — Encounter: Payer: Self-pay | Admitting: Family Medicine

## 2017-12-07 ENCOUNTER — Ambulatory Visit (INDEPENDENT_AMBULATORY_CARE_PROVIDER_SITE_OTHER): Payer: Medicare Other

## 2017-12-07 VITALS — BP 144/88 | HR 73 | Temp 98.2°F | Ht <= 58 in | Wt 96.6 lb

## 2017-12-07 VITALS — BP 144/88 | HR 73 | Temp 98.2°F | Ht <= 58 in | Wt 96.4 lb

## 2017-12-07 DIAGNOSIS — I10 Essential (primary) hypertension: Secondary | ICD-10-CM | POA: Diagnosis not present

## 2017-12-07 DIAGNOSIS — E2839 Other primary ovarian failure: Secondary | ICD-10-CM

## 2017-12-07 DIAGNOSIS — F331 Major depressive disorder, recurrent, moderate: Secondary | ICD-10-CM

## 2017-12-07 DIAGNOSIS — Z1231 Encounter for screening mammogram for malignant neoplasm of breast: Secondary | ICD-10-CM

## 2017-12-07 DIAGNOSIS — Z0000 Encounter for general adult medical examination without abnormal findings: Secondary | ICD-10-CM | POA: Diagnosis not present

## 2017-12-07 DIAGNOSIS — E785 Hyperlipidemia, unspecified: Secondary | ICD-10-CM

## 2017-12-07 DIAGNOSIS — F411 Generalized anxiety disorder: Secondary | ICD-10-CM

## 2017-12-07 DIAGNOSIS — Z1211 Encounter for screening for malignant neoplasm of colon: Secondary | ICD-10-CM | POA: Diagnosis not present

## 2017-12-07 DIAGNOSIS — Z720 Tobacco use: Secondary | ICD-10-CM | POA: Diagnosis not present

## 2017-12-07 DIAGNOSIS — Z23 Encounter for immunization: Secondary | ICD-10-CM

## 2017-12-07 DIAGNOSIS — R296 Repeated falls: Secondary | ICD-10-CM

## 2017-12-07 MED ORDER — SERTRALINE HCL 100 MG PO TABS: 100.0000 mg | ORAL_TABLET | Freq: Every day | ORAL | 3 refills | Status: DC

## 2017-12-07 MED ORDER — ALPRAZOLAM 0.5 MG PO TABS: 0.2500 mg | ORAL_TABLET | Freq: Two times a day (BID) | ORAL | 2 refills | Status: DC | PRN

## 2017-12-07 NOTE — Assessment & Plan Note (Signed)
Above goal today, but does seem to have some white coat hypertension Will check home BPs and let me know if they are also elevated Continue lisinopril at current dose Repeat CMP

## 2017-12-07 NOTE — Assessment & Plan Note (Signed)
Uncontrolled with recent worsening Worsening may be related to trying to quit smoking Discussed risks of Paxil and Xanax given her age We will stop Paxil and start Zoloft at equivalent dose as above Can continue Xanax as needed and will increase the number of pills she gets monthly temporarily We discussed that this is not a good long-term medication

## 2017-12-07 NOTE — Assessment & Plan Note (Signed)
Uncontrolled Discussed risks of Paxil as it is anticholinergic and on the beers list We will stop Paxil and start Zoloft 100 mg daily, which is an equivalent dose to her current Paxil dose Follow-up in 3 months

## 2017-12-07 NOTE — Patient Instructions (Signed)
Come back for labs fasting in the morning on or after 12/14/17  Call and schedule mammogram  Someone will call to schedule GI, PT, and bone density appointments  Stop Paxil and Start Zoloft  Watch home blood pressure.  Goal is consistently less than 140/90   Preventive Care 65 Years and Older, Female Preventive care refers to lifestyle choices and visits with your health care provider that can promote health and wellness. What does preventive care include?  A yearly physical exam. This is also called an annual well check.  Dental exams once or twice a year.  Routine eye exams. Ask your health care provider how often you should have your eyes checked.  Personal lifestyle choices, including: ? Daily care of your teeth and gums. ? Regular physical activity. ? Eating a healthy diet. ? Avoiding tobacco and drug use. ? Limiting alcohol use. ? Practicing safe sex. ? Taking low-dose aspirin every day. ? Taking vitamin and mineral supplements as recommended by your health care provider. What happens during an annual well check? The services and screenings done by your health care provider during your annual well check will depend on your age, overall health, lifestyle risk factors, and family history of disease. Counseling Your health care provider may ask you questions about your:  Alcohol use.  Tobacco use.  Drug use.  Emotional well-being.  Home and relationship well-being.  Sexual activity.  Eating habits.  History of falls.  Memory and ability to understand (cognition).  Work and work Statistician.  Reproductive health.  Screening You may have the following tests or measurements:  Height, weight, and BMI.  Blood pressure.  Lipid and cholesterol levels. These may be checked every 5 years, or more frequently if you are over 74 years old.  Skin check.  Lung cancer screening. You may have this screening every year starting at age 42 if you have a  30-pack-year history of smoking and currently smoke or have quit within the past 15 years.  Fecal occult blood test (FOBT) of the stool. You may have this test every year starting at age 23.  Flexible sigmoidoscopy or colonoscopy. You may have a sigmoidoscopy every 5 years or a colonoscopy every 10 years starting at age 41.  Hepatitis C blood test.  Hepatitis B blood test.  Sexually transmitted disease (STD) testing.  Diabetes screening. This is done by checking your blood sugar (glucose) after you have not eaten for a while (fasting). You may have this done every 1-3 years.  Bone density scan. This is done to screen for osteoporosis. You may have this done starting at age 73.  Mammogram. This may be done every 1-2 years. Talk to your health care provider about how often you should have regular mammograms.  Talk with your health care provider about your test results, treatment options, and if necessary, the need for more tests. Vaccines Your health care provider may recommend certain vaccines, such as:  Influenza vaccine. This is recommended every year.  Tetanus, diphtheria, and acellular pertussis (Tdap, Td) vaccine. You may need a Td booster every 10 years.  Varicella vaccine. You may need this if you have not been vaccinated.  Zoster vaccine. You may need this after age 22.  Measles, mumps, and rubella (MMR) vaccine. You may need at least one dose of MMR if you were born in 1957 or later. You may also need a second dose.  Pneumococcal 13-valent conjugate (PCV13) vaccine. One dose is recommended after age 66.  Pneumococcal polysaccharide (  PPSV23) vaccine. One dose is recommended after age 69.  Meningococcal vaccine. You may need this if you have certain conditions.  Hepatitis A vaccine. You may need this if you have certain conditions or if you travel or work in places where you may be exposed to hepatitis A.  Hepatitis B vaccine. You may need this if you have certain  conditions or if you travel or work in places where you may be exposed to hepatitis B.  Haemophilus influenzae type b (Hib) vaccine. You may need this if you have certain conditions.  Talk to your health care provider about which screenings and vaccines you need and how often you need them. This information is not intended to replace advice given to you by your health care provider. Make sure you discuss any questions you have with your health care provider. Document Released: 02/07/2015 Document Revised: 10/01/2015 Document Reviewed: 11/12/2014 Elsevier Interactive Patient Education  [employer].

## 2017-12-07 NOTE — Patient Instructions (Addendum)
Amanda Tucker , Thank you for taking time to come for your Medicare Wellness Visit. I appreciate your ongoing commitment to your health goals. Please review the following plan we discussed and let me know if I can assist you in the future.   Screening recommendations/referrals: Colonoscopy: Ordered today. Pt aware the office will call re: appt. Mammogram: Ordered today. Pt to contact office to set up apt.  Bone Density: Ordered today. Pt aware the office will call re: appt. Recommended yearly ophthalmology/optometry visit for glaucoma screening and checkup Recommended yearly dental visit for hygiene and checkup  Vaccinations: Influenza vaccine: Administered today. Pneumococcal vaccine: Completed series today. Tdap vaccine: Pt declines today.  Shingles vaccine: Pt declines today.     Advanced directives: Advance directive discussed with you today. Even though you declined this today please call our office should you change your mind and we can give you the proper paperwork for you to fill out.  Conditions/risks identified: Smoking cessation and fall risk prevention.  Next appointment: 10:00 AM today with Dr Brita Romp.    Preventive Care 37 Years and Older, Female Preventive care refers to lifestyle choices and visits with your health care provider that can promote health and wellness. What does preventive care include?  A yearly physical exam. This is also called an annual well check.  Dental exams once or twice a year.  Routine eye exams. Ask your health care provider how often you should have your eyes checked.  Personal lifestyle choices, including:  Daily care of your teeth and gums.  Regular physical activity.  Eating a healthy diet.  Avoiding tobacco and drug use.  Limiting alcohol use.  Practicing safe sex.  Taking low-dose aspirin every day.  Taking vitamin and mineral supplements as recommended by your health care provider. What happens during an annual well  check? The services and screenings done by your health care provider during your annual well check will depend on your age, overall health, lifestyle risk factors, and family history of disease. Counseling  Your health care provider may ask you questions about your:  Alcohol use.  Tobacco use.  Drug use.  Emotional well-being.  Home and relationship well-being.  Sexual activity.  Eating habits.  History of falls.  Memory and ability to understand (cognition).  Work and work Statistician.  Reproductive health. Screening  You may have the following tests or measurements:  Height, weight, and BMI.  Blood pressure.  Lipid and cholesterol levels. These may be checked every 5 years, or more frequently if you are over 15 years old.  Skin check.  Lung cancer screening. You may have this screening every year starting at age 63 if you have a 30-pack-year history of smoking and currently smoke or have quit within the past 15 years.  Fecal occult blood test (FOBT) of the stool. You may have this test every year starting at age 46.  Flexible sigmoidoscopy or colonoscopy. You may have a sigmoidoscopy every 5 years or a colonoscopy every 10 years starting at age 32.  Hepatitis C blood test.  Hepatitis B blood test.  Sexually transmitted disease (STD) testing.  Diabetes screening. This is done by checking your blood sugar (glucose) after you have not eaten for a while (fasting). You may have this done every 1-3 years.  Bone density scan. This is done to screen for osteoporosis. You may have this done starting at age 59.  Mammogram. This may be done every 1-2 years. Talk to your health care provider about how  often you should have regular mammograms. Talk with your health care provider about your test results, treatment options, and if necessary, the need for more tests. Vaccines  Your health care provider may recommend certain vaccines, such as:  Influenza vaccine. This is  recommended every year.  Tetanus, diphtheria, and acellular pertussis (Tdap, Td) vaccine. You may need a Td booster every 10 years.  Zoster vaccine. You may need this after age 85.  Pneumococcal 13-valent conjugate (PCV13) vaccine. One dose is recommended after age 102.  Pneumococcal polysaccharide (PPSV23) vaccine. One dose is recommended after age 57. Talk to your health care provider about which screenings and vaccines you need and how often you need them. This information is not intended to replace advice given to you by your health care provider. Make sure you discuss any questions you have with your health care provider. Document Released: 02/07/2015 Document Revised: 10/01/2015 Document Reviewed: 11/12/2014 Elsevier Interactive Patient Education  2017 [place] Prevention in the Home Falls can cause injuries. They can happen to people of all ages. There are many things you can do to make your home safe and to help prevent falls. What can I do on the outside of my home?  Regularly fix the edges of walkways and driveways and fix any cracks.  Remove anything that might make you trip as you walk through a door, such as a raised step or threshold.  Trim any bushes or trees on the path to your home.  Use bright outdoor lighting.  Clear any walking paths of anything that might make someone trip, such as rocks or tools.  Regularly check to see if handrails are loose or broken. Make sure that both sides of any steps have handrails.  Any raised decks and porches should have guardrails on the edges.  Have any leaves, snow, or ice cleared regularly.  Use sand or salt on walking paths during winter.  Clean up any spills in your garage right away. This includes oil or grease spills. What can I do in the bathroom?  Use night lights.  Install grab bars by the toilet and in the tub and shower. Do not use towel bars as grab bars.  Use non-skid mats or decals in the tub or  shower.  If you need to sit down in the shower, use a plastic, non-slip stool.  Keep the floor dry. Clean up any water that spills on the floor as soon as it happens.  Remove soap buildup in the tub or shower regularly.  Attach bath mats securely with double-sided non-slip rug tape.  Do not have throw rugs and other things on the floor that can make you trip. What can I do in the bedroom?  Use night lights.  Make sure that you have a light by your bed that is easy to reach.  Do not use any sheets or blankets that are too big for your bed. They should not hang down onto the floor.  Have a firm chair that has side arms. You can use this for support while you get dressed.  Do not have throw rugs and other things on the floor that can make you trip. What can I do in the kitchen?  Clean up any spills right away.  Avoid walking on wet floors.  Keep items that you use a lot in easy-to-reach places.  If you need to reach something above you, use a strong step stool that has a grab bar.  Keep electrical  cords out of the way.  Do not use floor polish or wax that makes floors slippery. If you must use wax, use non-skid floor wax.  Do not have throw rugs and other things on the floor that can make you trip. What can I do with my stairs?  Do not leave any items on the stairs.  Make sure that there are handrails on both sides of the stairs and use them. Fix handrails that are broken or loose. Make sure that handrails are as long as the stairways.  Check any carpeting to make sure that it is firmly attached to the stairs. Fix any carpet that is loose or worn.  Avoid having throw rugs at the top or bottom of the stairs. If you do have throw rugs, attach them to the floor with carpet tape.  Make sure that you have a light switch at the top of the stairs and the bottom of the stairs. If you do not have them, ask someone to add them for you. What else can I do to help prevent  falls?  Wear shoes that:  Do not have high heels.  Have rubber bottoms.  Are comfortable and fit you well.  Are closed at the toe. Do not wear sandals.  If you use a stepladder:  Make sure that it is fully opened. Do not climb a closed stepladder.  Make sure that both sides of the stepladder are locked into place.  Ask someone to hold it for you, if possible.  Clearly mark and make sure that you can see:  Any grab bars or handrails.  First and last steps.  Where the edge of each step is.  Use tools that help you move around (mobility aids) if they are needed. These include:  Canes.  Walkers.  Scooters.  Crutches.  Turn on the lights when you go into a dark area. Replace any light bulbs as soon as they burn out.  Set up your furniture so you have a clear path. Avoid moving your furniture around.  If any of your floors are uneven, fix them.  If there are any pets around you, be aware of where they are.  Review your medicines with your doctor. Some medicines can make you feel dizzy. This can increase your chance of falling. Ask your doctor what other things that you can do to help prevent falls. This information is not intended to replace advice given to you by your health care provider. Make sure you discuss any questions you have with your health care provider. Document Released: 11/07/2008 Document Revised: 06/19/2015 Document Reviewed: 02/15/2014 Elsevier Interactive Patient Education  2017 [employer].

## 2017-12-07 NOTE — Assessment & Plan Note (Signed)
Continue Zocor Recheck lipids and CMP

## 2017-12-07 NOTE — Assessment & Plan Note (Addendum)
Congratulated patient on smoking cessation efforts She has cut back significantly on tobacco intake and wants to quit completely 3 to 5-minute discussion regarding the harms of smoking and different cessation methods

## 2017-12-07 NOTE — Progress Notes (Signed)
Patient: Amanda Tucker, Female    DOB: January 23, 1946, 72 y.o.   MRN: [id number] Visit Date: 12/07/2017  Today's Provider: [name], MD   Chief Complaint  Patient presents with  . Annual Exam   Subjective:  I, Tiburcio Pea, CMA, am acting as a scribe for [name], MD.   Patient had a AWE prior to appointment  HTN: - Medications: Lisinopril 20 mg daily - Compliance: Good - Checking BP at home: No - Denies any SOB, CP, vision changes, LE edema, medication SEs, or symptoms of hypotension - Diet: Low-sodium - Exercise: None regularly  HLD - medications: Simvastatin 20 mg daily - compliance: Good - medication SEs: None, denies myalgias -Also taking aspirin 81 mg daily  Patient also reports that she is fallen 4 times within the last few months.  These have all been mechanical falls.  She and her daughters are worried about her shuffling gait and how she leans forward when walking.  She does not have any tremor.  She has not had any head injuries, but she did get a laceration on her chin after one fall.  Her daughter put a Band-Aid on this and it was healed after few days.  Patient is also trying to quit smoking.  She is cut down from 1 pack/day to 2 to 10 cigarettes/day.  The amount she smokes tends to depend on her stress levels that day.  She has used smoking as a stress reliever for many years.  Depression and anxiety: Patient is taking Paxil 40 mg daily.  She had cut back from Xanax 0.5 mg nightly to 0.25 mg nightly.  She has now run out of her Xanax early as she finds that her anxiety is worsening and she is taking this more than once a day.  She has not had any recent increase in stressors.   Review of Systems  Constitutional: Negative.   HENT: Negative.   Eyes: Negative.   Respiratory: Negative.   Cardiovascular: Negative.   Gastrointestinal: Negative.   Endocrine: Negative.   Genitourinary: Positive for enuresis (one episode).  Musculoskeletal:  Positive for gait problem.  Skin: Negative.   Allergic/Immunologic: Negative.   Hematological: Negative.   Psychiatric/Behavioral: Positive for agitation. The patient is nervous/anxious.     Social History   Socioeconomic History  . Marital status: Divorced    Spouse name: Not on file  . Number of children: 2  . Years of education: Not on file  . Highest education level: Some college, no degree  Occupational History  . Occupation: retired  Scientific laboratory technician  . Financial resource strain: Not hard at all  . Food insecurity:    Worry: Never true    Inability: Never true  . Transportation needs:    Medical: No    Non-medical: No  Tobacco Use  . Smoking status: Current Every Day Smoker    Packs/day: 0.25    Years: 54.00    Pack years: 13.50    Types: Cigarettes  . Smokeless tobacco: Never Used  . Tobacco comment: started smoking at age 31; smoking about 3 cigarettes throughout the day  Substance and Sexual Activity  . Alcohol use: Yes    Alcohol/week: 5.0 standard drinks    Types: 5 Glasses of wine per week  . Drug use: No  . Sexual activity: Never  Lifestyle  . Physical activity:    Days per week: 0 days    Minutes per session: 0 min  . Stress: Very much  Relationships  . Social connections:    Talks on phone: Patient refused    Gets together: Patient refused    Attends religious service: Patient refused    Active member of club or organization: Patient refused    Attends meetings of clubs or organizations: Patient refused    Relationship status: Patient refused  . Intimate partner violence:    Fear of current or ex partner: Patient refused    Emotionally abused: Patient refused    Physically abused: Patient refused    Forced sexual activity: Patient refused  Other Topics Concern  . Not on file  Social History Narrative  . Not on file    Past Medical History:  Diagnosis Date  . Anxiety   . Depression   . Diverticulitis   . Hyperlipidemia   . Hypertension     . Right sided sciatica      Patient Active Problem List   Diagnosis Date Noted  . Baker's cyst of knee, right 06/06/2017  . Insomnia 03/07/2017  . Tobacco abuse 12/01/2016  . Hypertension   . Hyperlipidemia   . Depression   . Anxiety     Past Surgical History:  Procedure Laterality Date  . NO PAST SURGERIES      Her family history includes CVA in her maternal grandfather; Dementia (age of onset: 3) in her mother; Heart attack (age of onset: 20) in her father; Hyperlipidemia in her father; Hypertension in her father; Hypothyroidism in her daughter; Pancreatic cancer in her brother. There is no history of Breast cancer or Colon cancer.      Current Outpatient Medications:  .  ALPRAZolam (XANAX) 0.5 MG tablet, TAKE 1/2 TABLET BY MOUTH EVERY NIGHT AT BEDTIME AS NEEDED FOR ANXIETY, Disp: 15 tablet, Rfl: 2 .  aspirin EC 81 MG tablet, Take 81 mg daily by mouth., Disp: , Rfl:  .  aspirin-acetaminophen-caffeine (EXCEDRIN MIGRAINE) 250-250-65 MG tablet, Take 2 tablets by mouth as needed for headache., Disp: , Rfl:  .  lisinopril (PRINIVIL,ZESTRIL) 20 MG tablet, Take 1 tablet (20 mg total) by mouth daily., Disp: 30 tablet, Rfl: 5 .  PARoxetine (PAXIL) 40 MG tablet, Take 1 tablet (40 mg total) by mouth daily., Disp: 90 tablet, Rfl: 3 .  simvastatin (ZOCOR) 20 MG tablet, Take 1 tablet (20 mg total) by mouth daily at 6 PM., Disp: 90 tablet, Rfl: 3  Patient Care Team: Virginia Crews, MD as PCP - General (Family Medicine) Pa, [place] (Optometry)     Objective:   Vitals: BP (!) 144/88 (BP Location: Right Arm, Patient Position: Sitting, Cuff Size: Normal)   Pulse 73   Temp 98.2 F (36.8 C) (Oral)   Ht 4\' 8"  (1.422 m)   Wt 96 lb 9.6 oz (43.8 kg)   BMI 21.66 kg/m   Physical Exam  Constitutional: She is oriented to person, place, and time. She appears well-developed and well-nourished. No distress.  HENT:  Head: Normocephalic and atraumatic.  Right Ear: External ear  normal.  Left Ear: External ear normal.  Nose: Nose normal.  Mouth/Throat: Oropharynx is clear and moist.  Eyes: Pupils are equal, round, and reactive to light. Conjunctivae and EOM are normal. No scleral icterus.  Neck: Neck supple. No thyromegaly present.  Cardiovascular: Normal rate, regular rhythm, normal heart sounds and intact distal pulses.  No murmur heard. Pulmonary/Chest: Effort normal and breath sounds normal. No respiratory distress. She has no wheezes. She has no rales.  Abdominal: Soft. Bowel sounds are normal. She  exhibits no distension. There is no tenderness. There is no rebound and no guarding.  Musculoskeletal: She exhibits no edema or deformity.  Shuffling gait. No tremor.  Lymphadenopathy:    She has no cervical adenopathy.  Neurological: She is alert and oriented to person, place, and time.  Skin: Skin is warm and dry. Capillary refill takes less than 2 seconds. No rash noted.  Psychiatric: Her behavior is normal. Her mood appears anxious. Her affect is blunt. She expresses no homicidal and no suicidal ideation.  Vitals reviewed.   Activities of Daily Living In your present state of health, do you have any difficulty performing the following activities: 12/07/2017  Hearing? Y  Comment Does not wear hearing aids.   Vision? Y  Comment Due to cataract in right eye.   Difficulty concentrating or making decisions? N  Walking or climbing stairs? Y  Comment Due to impaired balance/gait.   Dressing or bathing? N  Doing errands, shopping? N  Preparing Food and eating ? N  Using the Toilet? N  In the past six months, have you accidently leaked urine? N  Do you have problems with loss of bowel control? N  Managing your Medications? N  Managing your Finances? N  Housekeeping or managing your Housekeeping? N  Some recent data might be hidden    Fall Risk Assessment Fall Risk  12/07/2017 12/01/2016  Falls in the past year? 1 No  Number falls in past yr: 1 -  Injury  with Fall? 0 -  Risk for fall due to : Impaired balance/gait -  Risk for fall due to: Comment Pt needs a PT evaluation due to imparied gait.  -     Depression Screen PHQ 2/9 Scores 12/07/2017 06/06/2017 03/07/2017 01/20/2017  PHQ - 2 Score 1 0 0 1  PHQ- 9 Score - 3 6 8     Assessment & Plan:    F/u to [facility] Reviewed patient's Family Medical History Reviewed and updated list of patient's medical providers Assessment of cognitive impairment was done Assessed patient's functional ability Established a written schedule for health screening [place] Completed and Reviewed  Exercise Activities and Dietary recommendations Goals    . LIFESTYLE - DECREASE FALLS RISK     Recommend to remove any items from the home that may cause slips or trips.       . Quit Smoking     Recommend to continue efforts to reduce smoking habits until no longer smoking (Smoking Cessation literature attached to AVS).         Immunization History  Administered Date(s) Administered  . Influenza, High Dose Seasonal PF 12/01/2016, 12/07/2017  . Pneumococcal Conjugate-13 12/01/2016  . Pneumococcal Polysaccharide-23 12/07/2017    Health Maintenance  Topic Date Due  . MAMMOGRAM  09/04/1994  . COLONOSCOPY  09/04/1994  . DEXA SCAN  09/03/2009  . TETANUS/TDAP  12/08/2022 (Originally 09/04/1963)  . INFLUENZA VACCINE  Completed  . Hepatitis C Screening  Completed  . PNA vac Low Risk Adult  Completed     Discussed health benefits of physical activity, and encouraged her to engage in regular exercise appropriate for her age and condition.    ------------------------------------------------------------------------------------------------------------  Problem List Items Addressed This Visit      Cardiovascular and Mediastinum   Hypertension - Primary    Above goal today, but does seem to have some white coat hypertension Will check home BPs and let me know if they are also  elevated Continue lisinopril at current dose  Repeat CMP      Relevant Orders   Comprehensive metabolic panel     Other   Hyperlipidemia    Continue Zocor Recheck lipids and CMP      Relevant Orders   Lipid panel   Comprehensive metabolic panel   MDD (major depressive disorder)    Uncontrolled Discussed risks of Paxil as it is anticholinergic and on the beers list We will stop Paxil and start Zoloft 100 mg daily, which is an equivalent dose to her current Paxil dose Follow-up in 3 months      Relevant Medications   sertraline (ZOLOFT) 100 MG tablet   ALPRAZolam (XANAX) 0.5 MG tablet   Generalized anxiety disorder    Uncontrolled with recent worsening Worsening may be related to trying to quit smoking Discussed risks of Paxil and Xanax given her age We will stop Paxil and start Zoloft at equivalent dose as above Can continue Xanax as needed and will increase the number of pills she gets monthly temporarily We discussed that this is not a good long-term medication      Relevant Medications   sertraline (ZOLOFT) 100 MG tablet   ALPRAZolam (XANAX) 0.5 MG tablet   Tobacco abuse    Congratulated patient on smoking cessation efforts She has cut back significantly on tobacco intake and wants to quit completely 3 to 5-minute discussion regarding the harms of smoking and different cessation methods       Other Visit Diagnoses    Frequent falls       Relevant Orders   Ambulatory referral to Physical Therapy       Return in about 3 months (around 03/09/2018) for anxiety/depression and BP.   The entirety of the information documented in the History of Present Illness, Review of Systems and Physical Exam were personally obtained by me. Portions of this information were initially documented by Tiburcio Pea, CMA and reviewed by me for thoroughness and accuracy.    Virginia Crews, MD, MPH [facility] 12/07/2017 11:25 AM

## 2017-12-07 NOTE — Progress Notes (Signed)
Subjective:   Amanda Tucker is a 72 y.o. female who presents for an Initial Medicare Annual Wellness Visit.  Review of Systems    N/A  Cardiac Risk Factors include: advanced age (>58men, >88 women);dyslipidemia;hypertension;smoking/ tobacco exposure     Objective:    Today's Vitals   12/07/17 0917  BP: (!) 144/88  Pulse: 73  Temp: 98.2 F (36.8 C)  TempSrc: Oral  Weight: 96 lb 6.4 oz (43.7 kg)  Height: 4\' 8"  (1.422 m)  PainSc: 0-No pain   Body mass index is 21.61 kg/m.  Advanced Directives 12/07/2017  Does Patient Have a Medical Advance Directive? No  Would patient like information on creating a medical advance directive? No - Patient declined    Current Medications (verified) Outpatient Encounter Medications as of 12/07/2017  Medication Sig  . ALPRAZolam (XANAX) 0.5 MG tablet TAKE 1/2 TABLET BY MOUTH EVERY NIGHT AT BEDTIME AS NEEDED FOR ANXIETY  . aspirin EC 81 MG tablet Take 81 mg daily by mouth.  Marland Kitchen aspirin-acetaminophen-caffeine (EXCEDRIN MIGRAINE) 250-250-65 MG tablet Take 2 tablets by mouth as needed for headache.  . lisinopril (PRINIVIL,ZESTRIL) 20 MG tablet Take 1 tablet (20 mg total) by mouth daily.  Marland Kitchen PARoxetine (PAXIL) 40 MG tablet Take 1 tablet (40 mg total) by mouth daily.  . simvastatin (ZOCOR) 20 MG tablet Take 1 tablet (20 mg total) by mouth daily at 6 PM.   No facility-administered encounter medications on file as of 12/07/2017.     Allergies (verified) Patient has no active allergies.   History: Past Medical History:  Diagnosis Date  . Anxiety   . Depression   . Diverticulitis   . Hyperlipidemia   . Hypertension   . Right sided sciatica    Past Surgical History:  Procedure Laterality Date  . NO PAST SURGERIES     Family History  Problem Relation Age of Onset  . Dementia Mother 70  . Hypertension Father   . Hyperlipidemia Father   . Heart attack Father 67  . Pancreatic cancer Brother   . CVA Maternal Grandfather   . Hypothyroidism  Daughter        [place]  . Breast cancer Neg Hx   . Colon cancer Neg Hx    Social History   Socioeconomic History  . Marital status: Divorced    Spouse name: Not on file  . Number of children: 2  . Years of education: Not on file  . Highest education level: Some college, no degree  Occupational History  . Occupation: retired  Scientific laboratory technician  . Financial resource strain: Not hard at all  . Food insecurity:    Worry: Never true    Inability: Never true  . Transportation needs:    Medical: No    Non-medical: No  Tobacco Use  . Smoking status: Current Every Day Smoker    Packs/day: 0.25    Years: 54.00    Pack years: 13.50    Types: Cigarettes  . Smokeless tobacco: Never Used  . Tobacco comment: started smoking at age 71; smoking about 3 cigarettes throughout the day  Substance and Sexual Activity  . Alcohol use: Yes    Alcohol/week: 5.0 standard drinks    Types: 5 Glasses of wine per week  . Drug use: No  . Sexual activity: Never  Lifestyle  . Physical activity:    Days per week: 0 days    Minutes per session: 0 min  . Stress: Very much  Relationships  . Social connections:  Talks on phone: Patient refused    Gets together: Patient refused    Attends religious service: Patient refused    Active member of club or organization: Patient refused    Attends meetings of clubs or organizations: Patient refused    Relationship status: Patient refused  Other Topics Concern  . Not on file  Social History Narrative  . Not on file    Tobacco Counseling Ready to quit: Yes Counseling given: No Comment: started smoking at age 18; smoking about 3 cigarettes throughout the day   Clinical Intake:     Pain Score: 0-No pain(Only with movement. Has a bakers cyst on the back on the right knee. )                  Activities of Daily Living In your present state of health, do you have any difficulty performing the following activities: 12/07/2017  Hearing? Y    Comment Does not wear hearing aids.   Vision? Y  Comment Due to cataract in right eye.   Difficulty concentrating or making decisions? N  Walking or climbing stairs? Y  Comment Due to impaired balance/gait.   Dressing or bathing? N  Doing errands, shopping? N  Preparing Food and eating ? N  Using the Toilet? N  In the past six months, have you accidently leaked urine? N  Do you have problems with loss of bowel control? N  Managing your Medications? N  Managing your Finances? N  Housekeeping or managing your Housekeeping? N  Some recent data might be hidden     Immunizations and Health Maintenance Immunization History  Administered Date(s) Administered  . Influenza, High Dose Seasonal PF 12/01/2016, 12/07/2017  . Pneumococcal Conjugate-13 12/01/2016  . Pneumococcal Polysaccharide-23 12/07/2017   Health Maintenance Due  Topic Date Due  . TETANUS/TDAP  09/04/1963  . MAMMOGRAM  09/04/1994  . COLONOSCOPY  09/04/1994  . DEXA SCAN  09/03/2009    Patient Care Team: Virginia Crews, MD as PCP - General (Family Medicine) Pa, [place])  Indicate any recent Medical Services you may have received from other than [facility] providers in the past year (date may be approximate).     Assessment:   This is a routine wellness examination for [facility].  Hearing/Vision screen No exam data present  Dietary issues and exercise activities discussed: Current Exercise Habits: The patient does not participate in regular exercise at present([facility] at senior center [facility]. ), Exercise limited by: None identified  Goals    . LIFESTYLE - DECREASE FALLS RISK     Recommend to remove any items from the home that may cause slips or trips.       . Quit Smoking     Recommend to continue efforts to reduce smoking habits until no longer smoking (Smoking Cessation literature attached to AVS).        Depression Screen PHQ 2/9 Scores 12/07/2017 06/06/2017 03/07/2017  01/20/2017 12/01/2016  PHQ - 2 Score 1 0 0 1 3  PHQ- 9 Score - 3 6 8 12     Fall Risk Fall Risk  12/07/2017 12/01/2016  Falls in the past year? 1 No  Number falls in past yr: 1 -  Injury with Fall? 0 -  Risk for fall due to : Impaired balance/gait -  Risk for fall due to: Comment Pt needs a PT evaluation due to imparied gait.  -   FALL RISK PREVENTION PERTAINING TO THE HOME:  Any stairs in  or around the home WITH handrails? Yes  Home free of loose throw rugs in walkways, pet beds, electrical cords, etc? Yes  Adequate lighting in your home to reduce risk of falls? Yes   ASSISTIVE DEVICES UTILIZED TO PREVENT FALLS:  Life alert? No  Use of a cane, walker or w/c? No  Grab bars in the bathroom? No  Shower chair or bench in shower? No  Elevated toilet seat or a handicapped toilet? No    TIMED UP AND GO:  Was the test performed? No .     Cognitive Function:      6CIT Screen 12/07/2017  What Year? 0 points  What month? 0 points  What time? 0 points  Count back from 20 0 points  Months in reverse 0 points  Repeat phrase 2 points  Total Score 2    Screening Tests Health Maintenance  Topic Date Due  . TETANUS/TDAP  09/04/1963  . MAMMOGRAM  09/04/1994  . COLONOSCOPY  09/04/1994  . DEXA SCAN  09/03/2009  . INFLUENZA VACCINE  Completed  . Hepatitis C Screening  Completed  . PNA vac Low Risk Adult  Completed    Qualifies for Shingles Vaccine? Yes . Due for Shingrix. Education has been provided regarding the importance of this vaccine. Pt has been advised to call insurance company to determine out of pocket expense. Advised may also receive vaccine at local pharmacy or Health Dept. Verbalized acceptance and understanding.  Tdap: Although this vaccine is not a covered service during a Wellness Exam, does the patient still wish to receive this vaccine today?  No .  Education has been provided regarding the importance of this vaccine. Advised may receive this vaccine at local  pharmacy or Health Dept. Aware to provide a copy of the vaccination record if obtained from local pharmacy or Health Dept. Verbalized acceptance and understanding.  Flu Vaccine: Due for Flu vaccine. Does the patient want to receive this vaccine today?  Yes .   Pneumococcal Vaccine: Due for Pneumococcal vaccine. Does the patient want to receive this vaccine today?  Yes .   Cancer Screenings:  Colorectal Screening: Referral to GI placed today. Pt aware the office will call re: appt.  Mammogram: Ordered today. Pt provided with contact info and advised to call to schedule appt.   Bone Density: Ordered today. Pt aware the office will call re: appt.  Lung Cancer Screening: (Low Dose CT Chest recommended if Age 72-80 years, 30 pack-year currently smoking OR have quit w/in 15years.) does qualify.    Additional Screening:  Hepatitis C Screening: Up to date  Vision Screening: Recommended annual ophthalmology exams for early detection of glaucoma and other disorders of the eye.  Dental Screening: Recommended annual dental exams for proper oral hygiene  Community Resource Referral:  CRR required this visit?  No       Plan:  I have personally reviewed and addressed the Medicare Annual Wellness questionnaire and have noted the following in the patient's chart:  A. Medical and social history B. Use of alcohol, tobacco or illicit drugs  C. Current medications and supplements D. Functional ability and status E.  Nutritional status F.  Physical activity G. Advance directives H. List of other physicians I.  Hospitalizations, surgeries, and ER visits in previous 12 months J.  [place] such as hearing and vision if needed, cognitive and depression L. Referrals and appointments - none  In addition, I have reviewed and discussed with patient certain preventive protocols, quality  metrics, and best practice recommendations. A written personalized care plan for preventive services as  well as general preventive health recommendations were provided to patient.  See attached scanned questionnaire for additional information.   Signed,  Fabio Neighbors, LPN Nurse Health Advisor   Nurse Recommendations: Ordered placed for colonoscopy referral, mammogram order and Dexa referral. Pt declined the tetanus vaccine today. Pt is interested in a PT referral, advised her to speak with PCP due to needing a dx to order.

## 2017-12-28 ENCOUNTER — Encounter: Payer: Self-pay | Admitting: Physical Therapy

## 2017-12-28 ENCOUNTER — Ambulatory Visit: Payer: Medicare Other | Attending: Family Medicine | Admitting: Physical Therapy

## 2017-12-28 ENCOUNTER — Other Ambulatory Visit: Payer: Self-pay

## 2017-12-28 DIAGNOSIS — M6281 Muscle weakness (generalized): Secondary | ICD-10-CM | POA: Diagnosis not present

## 2017-12-28 DIAGNOSIS — R262 Difficulty in walking, not elsewhere classified: Secondary | ICD-10-CM | POA: Diagnosis not present

## 2017-12-28 DIAGNOSIS — R2689 Other abnormalities of gait and mobility: Secondary | ICD-10-CM | POA: Insufficient documentation

## 2017-12-28 NOTE — Therapy (Signed)
[place] MAIN [facility] SERVICES [address] [place], [place], [address] Phone: [phone]   Fax:  [phone]  Physical Therapy Evaluation  Patient Details  Name: Amanda Tucker MRN: [id number] Date of Birth: 1945-04-20 Referring Provider (PT): [name]    Encounter Date: 12/28/2017  PT End of Session - 12/28/17 0837    Visit Number  1    Number of Visits  17    Date for PT Re-Evaluation  02/22/18    Authorization Type  1/10 eval date 12/28/17    PT Start Time  0820    PT Stop Time  0920    PT Time Calculation (min)  60 min    Equipment Utilized During Treatment  Gait belt    Activity Tolerance  Patient tolerated treatment well    Behavior During Therapy  [facility] for tasks assessed/performed       Past Medical History:  Diagnosis Date  . Anxiety   . Depression   . Diverticulitis   . Hyperlipidemia   . Hypertension   . Right sided sciatica     Past Surgical History:  Procedure Laterality Date  . NO PAST SURGERIES      There were no vitals filed for this visit.  Subjective Assessment - 12/28/17 0830    Subjective  Patient feesl unsteady and has had 2 bad falls 6 weeks ago.     Pertinent History  Patient lives with her daughter and her husband. She does not use an AD and has had 2 recent falls.     Limitations  Standing;Walking    Patient Stated Goals  Patient wants to have better balance .     Currently in Pain?  No/denies    Pain Score  0-No pain    Multiple Pain Sites  No         [facility] PT Assessment - 12/28/17 0001      Assessment   Medical Diagnosis  frequent falls    Referring Provider (PT)  [name]     Onset Date/Surgical Date  12/07/17    Hand Dominance  Right    Prior Therapy  no      Precautions   Precautions  Fall      Restrictions   Weight Bearing Restrictions  No      Balance Screen   Has the patient fallen in the past 6 months  Yes    How many times?  2   2   Has the patient had a  decrease in activity level because of a fear of falling?   Yes    Is the patient reluctant to leave their home because of a fear of falling?   No      Home Environment   Living Environment  Private residence    Available Help at Discharge  Family    Type of [place] to enter    Entrance Stairs-Number of Steps  1    Entrance Stairs-Rails  None    Home Layout  Two level    Alternate Level Stairs-Number of Steps  12    Home Equipment  None      Prior Function   Level of Independence  Independent;Independent with basic ADLs;Independent with household mobility without device    Vocation  Retired    Leisure  reading      Cognition   Overall Cognitive Status  Within Functional Limits for tasks assessed  PAIN: No pain  POSTURE: trunk flexed, shoulders rounded   PROM/AROM: WFL  BUE and BLE  STRENGTH:  Graded on a 0-5 scale Muscle Group Left Right                          Hip Flex 4/5 4/5  Hip Abd 4/5 4/5  Hip Add -3/5 -3/5  Hip Ext 3/5 3/5  Hip IR/ER 4/5 4/5  Knee Flex 5/5 5/5  Knee Ext 5/5 5/5  Ankle DF 4/5 4/5  Ankle PF -4/5 -4/5   SENSATION: WNL BUE and BLE   SPECIAL TESTS: decreased flexibility hip flex, + ELYS    FUNCTIONAL MOBILITY: WNL   BALANCE: Static Standing Balance  Normal Able to maintain standing balance against maximal resistance   Good Able to maintain standing balance against moderate resistance   Good-/Fair+ Able to maintain standing balance against minimal resistance x  Fair Able to stand unsupported without UE support and without LOB for 1-2 min   Fair- Requires Min A and UE support to maintain standing without loss of balance   Poor+ Requires mod A and UE support to maintain standing without loss of balance   Poor Requires max A and UE support to maintain standing balance without loss    Standing Dynamic Balance  Normal Stand independently unsupported, able to weight shift and cross midline maximally    Good Stand independently unsupported, able to weight shift and cross midline moderately   Good-/Fair+ Stand independently unsupported, able to weight shift across midline minimally x  Fair Stand independently unsupported, weight shift, and reach ipsilaterally, loss of balance when crossing midline   Poor+ Able to stand with Min A and reach ipsilaterally, unable to weight shift   Poor Able to stand with Mod A and minimally reach ipsilaterally, unable to cross midline.    Tandem stand unable to get into position and unable to hold position  Single leg stand 1 second stand on each leg    GAIT: decreased gait speed, difficulty with hills, elevations, changing speeds and turning  OUTCOME MEASURES: TEST Outcome Interpretation  5 times sit<>stand 13 sec >60 yo, >15 sec indicates increased risk for falls  10 meter walk test           1.07      m/s <1.0 m/s indicates increased risk for falls; limited community ambulator  Timed up and Go      13            sec <14 sec indicates increased risk for falls      Berg Balance Assessment 45/56 <36/56 (100% risk for falls), 37-45 (80% risk for falls); 46-51 (>50% risk for falls); 52-55 (lower risk <25% of falls)  DGI  14/20 20/20 WNL    Treatment: Heel raises x 20 BLE single leg  Squat with 5  Sec hold x 10  Modified tandem stand in corner x 30 sec Feet together and head turn in corner x 30 sec                     PT Education - 12/28/17 0836    Education Details  plan of care    Person(s) Educated  Patient    Methods  Explanation    Comprehension  Verbalized understanding       PT Short Term Goals - 12/28/17 0921      PT SHORT TERM GOAL #1   Title  Patient will  be independent in home exercise program to improve strength/mobility for better functional independence with ADLs.    Time  4    Period  Weeks    Status  New    Target Date  01/25/18      PT SHORT TERM GOAL #2   Title  Patient will increase Berg Balance score  by > 6 points to demonstrate decreased fall risk during functional activities.    Time  4    Period  Weeks    Status  New    Target Date  01/25/18        PT Long Term Goals - 12/28/17 2119      PT LONG TERM GOAL #1   Title   Patient will be independent with ascend/descend 12 steps using single UE in step over step pattern without LOB.    Time  8    Period  Weeks    Status  New    Target Date  02/22/18      PT LONG TERM GOAL #2   Title  Patient will increase ABC scale score >80% to demonstrate better functional mobility and better confidence with ADLs.     Time  8    Status  New    Target Date  02/22/18      PT LONG TERM GOAL #3   Title  Patient will increase dynamic gait index score to >19/24 as to demonstrate reduced fall risk and improved dynamic gait balance for better safety with community/home ambulation.     Time  8    Period  Weeks    Status  New            Plan - 12/28/17 4174    Clinical Impression Statement  Patient presents for PT evaluation for dx of frequent falls. She has weakness in BLE, decreased hip flex flexibility, decreased static and dynamic standing balance, and poor standing posture. She has decreased Berg balance, decreased DGI, decreased 10 MW , decreased ABC scale. She will benefit from skilled PT to improve outcome measures and decrease her risk of falls.     Clinical Decision Making  Low    Rehab Potential  Good    PT Frequency  2x / week    PT Duration  8 weeks    PT Treatment/Interventions  Manual techniques;Balance training;Neuromuscular re-education;Patient/family education;Therapeutic exercise;Therapeutic activities;Stair training;Gait training    PT Next Visit Plan  review HEP, balance training    PT Home Exercise Plan  squats, heel raises, corner balance feet together /head turns, modified tandem in corner       Patient will benefit from skilled therapeutic intervention in order to improve the following deficits and impairments:   Abnormal gait, Decreased balance, Difficulty walking, Postural dysfunction, Impaired flexibility, Decreased strength, Decreased activity tolerance, Decreased mobility, Decreased endurance  Visit Diagnosis: Difficulty in walking, not elsewhere classified  Other abnormalities of gait and mobility  Muscle weakness (generalized)     Problem List Patient Active Problem List   Diagnosis Date Noted  . Baker's cyst of knee, right 06/06/2017  . Insomnia 03/07/2017  . Tobacco abuse 12/01/2016  . Hypertension   . Hyperlipidemia   . MDD (major depressive disorder)   . Generalized anxiety disorder     Alanson Puls, [place] DPT 12/28/2017, 10:05 AM  [place] MAIN [facility] SERVICES [address] [place], [place], [address] Phone: [phone]   Fax:  [phone]  Name: Pinkie Manger MRN: [id number] Date  of Birth: 1945-03-28

## 2018-01-03 ENCOUNTER — Ambulatory Visit: Payer: Medicare Other | Admitting: Physical Therapy

## 2018-01-03 ENCOUNTER — Encounter: Payer: Self-pay | Admitting: Physical Therapy

## 2018-01-03 DIAGNOSIS — R262 Difficulty in walking, not elsewhere classified: Secondary | ICD-10-CM

## 2018-01-03 DIAGNOSIS — M6281 Muscle weakness (generalized): Secondary | ICD-10-CM

## 2018-01-03 DIAGNOSIS — R2689 Other abnormalities of gait and mobility: Secondary | ICD-10-CM

## 2018-01-03 NOTE — Therapy (Signed)
[place] MAIN [facility] SERVICES [address] [place], [place], [address] Phone: [phone]   Fax:  [phone]  Physical Therapy Treatment  Patient Details  Name: Amanda Tucker MRN: [id number] Date of Birth: 1945/12/15 Referring Provider (PT): [name]    Encounter Date: 01/03/2018  PT End of Session - 01/03/18 0851    Visit Number  2    Number of Visits  17    Date for PT Re-Evaluation  02/22/18    Authorization Type  2/10 eval date 12/28/17    PT Start Time  0845    PT Stop Time  0925    PT Time Calculation (min)  40 min    Equipment Utilized During Treatment  Gait belt    Activity Tolerance  Patient tolerated treatment well    Behavior During Therapy  [facility] for tasks assessed/performed       Past Medical History:  Diagnosis Date  . Anxiety   . Depression   . Diverticulitis   . Hyperlipidemia   . Hypertension   . Right sided sciatica     Past Surgical History:  Procedure Laterality Date  . NO PAST SURGERIES      There were no vitals filed for this visit.  Subjective Assessment - 01/03/18 0850    Subjective  Patient feesl unsteady and has had 2 bad falls 6 weeks ago.     Pertinent History  Patient lives with her daughter and her husband. She does not use an AD and has had 2 recent falls.     Limitations  Standing;Walking    Patient Stated Goals  Patient wants to have better balance .     Currently in Pain?  No/denies    Pain Score  0-No pain       Ther-ex  Standing hip abd with 2# ankle weight x 15 BLE and  BUE support ( cues to keep foot straight and not to toe out) Standing hip extension with 2# ankle weight x 10 BLE with BUE support  Standing hip flexion marches with 2# ankle weight x 10 BLE with BUE support  Standing HS curls with 2# ankle weight x 10 BLE with BUE support  Standing heel raises x 15 with 2# ankle weights  LAQ with 2# ankle weights with 3 second holds x10 each LE  STS from chair without UE  support 2x10  Mini squats x10 with no UE support      Neuromuscular Re-education  Toe taps to 6" step without UE support alternating LE x15 each  Normal stance on Airex pad 2x60sec (requires minA for falls recovery 2 times)?  Forward and backward stepping over hurdle x15 each direction  SIde stepping over hurdle 15x each direction                         PT Education - 01/03/18 0851    Education Details  HEP    Person(s) Educated  Patient    Methods  Explanation    Comprehension  Verbalized understanding;Tactile cues required       PT Short Term Goals - 12/28/17 0921      PT SHORT TERM GOAL #1   Title  Patient will be independent in home exercise program to improve strength/mobility for better functional independence with ADLs.    Time  4    Period  Weeks    Status  New    Target Date  01/25/18  PT SHORT TERM GOAL #2   Title  Patient will increase Berg Balance score by > 6 points to demonstrate decreased fall risk during functional activities.    Time  4    Period  Weeks    Status  New    Target Date  01/25/18        PT Long Term Goals - 12/28/17 3428      PT LONG TERM GOAL #1   Title   Patient will be independent with ascend/descend 12 steps using single UE in step over step pattern without LOB.    Time  8    Period  Weeks    Status  New    Target Date  02/22/18      PT LONG TERM GOAL #2   Title  Patient will increase ABC scale score >80% to demonstrate better functional mobility and better confidence with ADLs.     Time  8    Status  New    Target Date  02/22/18      PT LONG TERM GOAL #3   Title  Patient will increase dynamic gait index score to >19/24 as to demonstrate reduced fall risk and improved dynamic gait balance for better safety with community/home ambulation.     Time  8    Period  Weeks    Status  New            Plan - 01/03/18 7681    Clinical Impression Statement  Pt requires direction and verbal cues for  correct performance of standing dynamic balance exercises and strengthening exercises. .Patient has fatigue with endurance and difficulty with transfers and standing tasks.  Patient struggles with posture and ability to perform sit to stand due to weakness, as well as balance with mobility.  Pt encouraged continuing HEP   Patient will benefit from continued skilled PT to improve mobility and safety    Rehab Potential  Good    PT Frequency  2x / week    PT Duration  8 weeks    PT Treatment/Interventions  Manual techniques;Balance training;Neuromuscular re-education;Patient/family education;Therapeutic exercise;Therapeutic activities;Stair training;Gait training    PT Next Visit Plan  review HEP, balance training    PT Home Exercise Plan  squats, heel raises, corner balance feet together /head turns, modified tandem in corner       Patient will benefit from skilled therapeutic intervention in order to improve the following deficits and impairments:  Abnormal gait, Decreased balance, Difficulty walking, Postural dysfunction, Impaired flexibility, Decreased strength, Decreased activity tolerance, Decreased mobility, Decreased endurance  Visit Diagnosis: Difficulty in walking, not elsewhere classified  Other abnormalities of gait and mobility  Muscle weakness (generalized)     Problem List Patient Active Problem List   Diagnosis Date Noted  . Baker's cyst of knee, right 06/06/2017  . Insomnia 03/07/2017  . Tobacco abuse 12/01/2016  . Hypertension   . Hyperlipidemia   . MDD (major depressive disorder)   . Generalized anxiety disorder     Alanson Puls, [place] DPT 01/03/2018, 8:56 AM  [place] MAIN [facility] SERVICES [address] [place], [place], [address] Phone: [phone]   Fax:  [phone]  Name: Amanda Tucker MRN: [id number] Date of Birth: Jun 10, 1945

## 2018-01-05 ENCOUNTER — Ambulatory Visit: Payer: Medicare Other

## 2018-01-10 ENCOUNTER — Ambulatory Visit: Payer: Medicare Other | Admitting: Physical Therapy

## 2018-01-10 ENCOUNTER — Encounter: Payer: Self-pay | Admitting: Physical Therapy

## 2018-01-10 DIAGNOSIS — M6281 Muscle weakness (generalized): Secondary | ICD-10-CM | POA: Diagnosis not present

## 2018-01-10 DIAGNOSIS — R2689 Other abnormalities of gait and mobility: Secondary | ICD-10-CM

## 2018-01-10 DIAGNOSIS — R262 Difficulty in walking, not elsewhere classified: Secondary | ICD-10-CM

## 2018-01-10 NOTE — Therapy (Signed)
[place] MAIN [facility] SERVICES [address] [place], [place], [address] Phone: [phone]   Fax:  [phone]  Physical Therapy Treatment  Patient Details  Name: Amanda Tucker MRN: [id number] Date of Birth: Jun 29, 1945 Referring Provider (PT): [name]    Encounter Date: 01/10/2018    Past Medical History:  Diagnosis Date  . Anxiety   . Depression   . Diverticulitis   . Hyperlipidemia   . Hypertension   . Right sided sciatica     Past Surgical History:  Procedure Laterality Date  . NO PAST SURGERIES      There were no vitals filed for this visit.  Subjective Assessment - 01/10/18 1455    Subjective  Patient feesl unsteady and has had 2 bad falls 6 weeks ago.     Pertinent History  Patient lives with her daughter and her husband. She does not use an AD and has had 2 recent falls.     Limitations  Standing;Walking    Patient Stated Goals  Patient wants to have better balance .     Currently in Pain?  No/denies    Pain Score  0-No pain         Neuromuscular Re-education   nustep level 3 for warm up (unbilled) x60mins  Toe taps to 6" step without UE support alternating LE x15 each   Normal stance on Airex pad 2x60sec (requires minA for falls recovery 2 times) ?  Forward and backward stepping over hurdle x15 each direction   SIde stepping over hurdle 15x each direction (requires minA for falls)  Standing on 1/2 bolster (Flat side up): Heel/toe rock 2x10 with finger tip hold for balance with cues to keep knee straight for better ankle strategies;   Stepping over 2 hurdle fwd side to side x 10   Stepping over 1 hurdle bwd x10 cues for posture, increased hip flexion   Standing hip abd with YTB 15 x 2  bilaterally   Standing hip extension without GTB  15 x 2  BLE    Cues for core activation of TA in standing to reduce back pain and for improved posture. Patient needed several sitting rest breaks during session.    Review HEP: Heel raises x20 reps bilaterally, BUE support in // bars, supervision for safety Marching x15 reps each leg, BUE support in // bars, supervision for safety Corner feet together with head turns  -VCs for proper technique and positioning for each exercise                       PT Education - 01/10/18 1456    Education Details  HEP    Person(s) Educated  Patient    Methods  Explanation    Comprehension  Verbalized understanding       PT Short Term Goals - 12/28/17 0921      PT SHORT TERM GOAL #1   Title  Patient will be independent in home exercise program to improve strength/mobility for better functional independence with ADLs.    Time  4    Period  Weeks    Status  New    Target Date  01/25/18      PT SHORT TERM GOAL #2   Title  Patient will increase Berg Balance score by > 6 points to demonstrate decreased fall risk during functional activities.    Time  4    Period  Weeks    Status  New  Target Date  01/25/18        PT Long Term Goals - 12/28/17 5909      PT LONG TERM GOAL #1   Title   Patient will be independent with ascend/descend 12 steps using single UE in step over step pattern without LOB.    Time  8    Period  Weeks    Status  New    Target Date  02/22/18      PT LONG TERM GOAL #2   Title  Patient will increase ABC scale score >80% to demonstrate better functional mobility and better confidence with ADLs.     Time  8    Status  New    Target Date  02/22/18      PT LONG TERM GOAL #3   Title  Patient will increase dynamic gait index score to >19/24 as to demonstrate reduced fall risk and improved dynamic gait balance for better safety with community/home ambulation.     Time  8    Period  Weeks    Status  New              Patient will benefit from skilled therapeutic intervention in order to improve the following deficits and impairments:     Visit Diagnosis: Difficulty in walking, not elsewhere  classified  Other abnormalities of gait and mobility  Muscle weakness (generalized)     Problem List Patient Active Problem List   Diagnosis Date Noted  . Baker's cyst of knee, right 06/06/2017  . Insomnia 03/07/2017  . Tobacco abuse 12/01/2016  . Hypertension   . Hyperlipidemia   . MDD (major depressive disorder)   . Generalized anxiety disorder     Alanson Puls, [place] DPT 01/10/2018, 3:02 PM  [place] MAIN [facility] SERVICES [address] [place], [place], [address] Phone: [phone]   Fax:  [phone]  Name: Amanda Tucker MRN: [id number] Date of Birth: Sep 12, 1945

## 2018-01-12 ENCOUNTER — Ambulatory Visit: Payer: Medicare Other

## 2018-01-12 DIAGNOSIS — M6281 Muscle weakness (generalized): Secondary | ICD-10-CM

## 2018-01-12 DIAGNOSIS — R2689 Other abnormalities of gait and mobility: Secondary | ICD-10-CM

## 2018-01-12 DIAGNOSIS — R262 Difficulty in walking, not elsewhere classified: Secondary | ICD-10-CM

## 2018-01-12 NOTE — Therapy (Signed)
[place] MAIN [facility] SERVICES [address] [place], [place], [address] Phone: [phone]   Fax:  [phone]  Physical Therapy Treatment  Patient Details  Name: Amanda Tucker MRN: [id number] Date of Birth: [birth year] Referring Provider (PT): [name]    Encounter Date: 01/12/2018  PT End of Session - 01/12/18 1356    Visit Number  4    Number of Visits  17    Date for PT Re-Evaluation  02/22/18    Authorization Type  4/10 eval date 12/28/17    PT Start Time  1350    PT Stop Time  1430    PT Time Calculation (min)  40 min    Equipment Utilized During Treatment  Gait belt    Activity Tolerance  Patient tolerated treatment well    Behavior During Therapy  [facility] for tasks assessed/performed       Past Medical History:  Diagnosis Date  . Anxiety   . Depression   . Diverticulitis   . Hyperlipidemia   . Hypertension   . Right sided sciatica     Past Surgical History:  Procedure Laterality Date  . NO PAST SURGERIES      There were no vitals filed for this visit.  Subjective Assessment - 01/12/18 1356    Subjective  Pt reports that her legs are sore from Tuesday. She was having some soreness in her neck but not currently.     Pertinent History  Patient lives with her daughter and her husband. She does not use an AD and has had 2 recent falls.     Limitations  Standing;Walking    Patient Stated Goals  Patient wants to have better balance .     Currently in Pain?  No/denies        TREATMENT    Ther-ex  Quantum leg press 60# x 20, 75# x 20 Quantum heel raises 45# 2 x 20; Step-ups to 6' step without UE support alternating LE x 10 each; Sit to stand without UE support x 10, after first couple reps added horizontal head turns during standing;  Neuromuscular Re-education  Toe taps to 6" step without UE support alternating LE x 10 each  Toe taps to 6" step without UE support alternating LE x 10 each  Gait in hallway using  single point cane with instruction about sequencing, pt cued to perform horizontal and vertical head turns as well as gait speed changes 75' x 2; 1/2 foam roll (round side up) static balance x 30s; 1/2 foam roll (round side up) static tandem balance alternating forward LE x 30s each; 1/2 foam roll (round side up) dynamic reaching for targets on mirror with therapist calling out laterality and target;   Pt educated throughout session about proper posture and technique with exercises. Improved exercise technique, movement at target joints, use of target muscles after min to mod verbal, visual, tactile cues.    Recommended use of at least single point cane for added safety due to repeated falls. Pt requires direction and verbal cues for correct performance of standing dynamic balance exercises and strengthening exercises. Limited resistance during leg press today due to subjective reports of soreness following last session persisting until today. Pt encouraged continuing HEP   Patient will benefit from continued skilled PT to improve mobility and safety.                   PT Short Term Goals - 12/28/17 9678  PT SHORT TERM GOAL #1   Title  Patient will be independent in home exercise program to improve strength/mobility for better functional independence with ADLs.    Time  4    Period  Weeks    Status  New    Target Date  01/25/18      PT SHORT TERM GOAL #2   Title  Patient will increase Berg Balance score by > 6 points to demonstrate decreased fall risk during functional activities.    Time  4    Period  Weeks    Status  New    Target Date  01/25/18        PT Long Term Goals - 12/28/17 7494      PT LONG TERM GOAL #1   Title   Patient will be independent with ascend/descend 12 steps using single UE in step over step pattern without LOB.    Time  8    Period  Weeks    Status  New    Target Date  02/22/18      PT LONG TERM GOAL #2   Title  Patient will increase  ABC scale score >80% to demonstrate better functional mobility and better confidence with ADLs.     Time  8    Status  New    Target Date  02/22/18      PT LONG TERM GOAL #3   Title  Patient will increase dynamic gait index score to >19/24 as to demonstrate reduced fall risk and improved dynamic gait balance for better safety with community/home ambulation.     Time  8    Period  Weeks    Status  New            Plan - 01/12/18 1356    Clinical Impression Statement  Recommended use of at least single point cane for added safety due to repeated falls. Pt requires direction and verbal cues for correct performance of standing dynamic balance exercises and strengthening exercises. Limited resistance during leg press today due to subjective reports of soreness following last session persisting until today. Pt encouraged continuing HEP   Patient will benefit from continued skilled PT to improve mobility and safety.     Rehab Potential  Good    PT Frequency  2x / week    PT Duration  8 weeks    PT Treatment/Interventions  Manual techniques;Balance training;Neuromuscular re-education;Patient/family education;Therapeutic exercise;Therapeutic activities;Stair training;Gait training    PT Next Visit Plan  review HEP, balance training    PT Home Exercise Plan  squats, heel raises, corner balance feet together /head turns, modified tandem in corner       Patient will benefit from skilled therapeutic intervention in order to improve the following deficits and impairments:  Abnormal gait, Decreased balance, Difficulty walking, Postural dysfunction, Impaired flexibility, Decreased strength, Decreased activity tolerance, Decreased mobility, Decreased endurance  Visit Diagnosis: Difficulty in walking, not elsewhere classified  Other abnormalities of gait and mobility  Muscle weakness (generalized)     Problem List Patient Active Problem List   Diagnosis Date Noted  . Baker's cyst of knee,  right 06/06/2017  . Insomnia 03/07/2017  . Tobacco abuse 12/01/2016  . Hypertension   . Hyperlipidemia   . MDD (major depressive disorder)   . Generalized anxiety disorder    Phillips Grout PT, DPT, GCS  Demitrios Molyneux 01/12/2018, 2:50 PM  [place] MAIN [facility] SERVICES [address] [place], [place], [address]  Phone: [phone]   Fax:  [phone]  Name: Amanda Tucker MRN: [id number] Date of Birth: 21-Jul-1945

## 2018-01-23 ENCOUNTER — Encounter: Payer: Self-pay | Admitting: Physical Therapy

## 2018-01-23 ENCOUNTER — Ambulatory Visit: Payer: Medicare Other | Admitting: Physical Therapy

## 2018-01-23 DIAGNOSIS — R262 Difficulty in walking, not elsewhere classified: Secondary | ICD-10-CM | POA: Diagnosis not present

## 2018-01-23 DIAGNOSIS — M6281 Muscle weakness (generalized): Secondary | ICD-10-CM

## 2018-01-23 DIAGNOSIS — R2689 Other abnormalities of gait and mobility: Secondary | ICD-10-CM | POA: Diagnosis not present

## 2018-01-23 NOTE — Therapy (Signed)
[place] MAIN [facility] SERVICES [address] [place], [place], [address] Phone: [phone]   Fax:  [phone]  Physical Therapy Treatment  Patient Details  Name: Amanda Tucker MRN: [id number] Date of Birth: 08/31/1945 Referring Provider (PT): [name]    Encounter Date: 01/23/2018  PT End of Session - 01/23/18 1436    Visit Number  5    Number of Visits  17    Date for PT Re-Evaluation  02/22/18    Authorization Type  5/10 eval date 12/28/17    PT Start Time  0230    PT Stop Time  0310    PT Time Calculation (min)  40 min    Equipment Utilized During Treatment  Gait belt    Activity Tolerance  Patient tolerated treatment well    Behavior During Therapy  [facility] for tasks assessed/performed       Past Medical History:  Diagnosis Date  . Anxiety   . Depression   . Diverticulitis   . Hyperlipidemia   . Hypertension   . Right sided sciatica     Past Surgical History:  Procedure Laterality Date  . NO PAST SURGERIES      There were no vitals filed for this visit.  Subjective Assessment - 01/23/18 1434    Subjective  Pt reports that her leg is sore from walking up steps. She is having right leg pain.     Pertinent History  Patient lives with her daughter and her husband. She does not use an AD and has had 2 recent falls.     Limitations  Standing;Walking    Patient Stated Goals  Patient wants to have better balance .     Currently in Pain?  Yes    Pain Score  5     Pain Orientation  Right    Pain Descriptors / Indicators  Aching    Pain Onset  In the past 7 days    Pain Frequency  Intermittent    Aggravating Factors   steps    Pain Relieving Factors  RLE    Effect of Pain on Daily Activities  no effect    Multiple Pain Sites  No       Ther-ex  Nu-step x Leg press 75 lbs x 20 x 2 Standing hip abd with 2# ankle weight x 15 BLE and  BUE support ( cues to keep foot straight and not to toe out) Standing hip extension  with 2# ankle weight x 10 BLE with BUE support  Standing hip flexion marches with 2# ankle weight x 10 BLE with BUE support  Standing HS curls with 2# ankle weight x 10 BLE with BUE support  Standing heel raises x 15 with 2# ankle weights  LAQ with 2# ankle weights with 3 second holds x10 each LE  STS from chair without UE support 2x10  Mini squats x10 with no UE support    Neuromuscular Re-education  Toe taps to 6" step without UE support alternating LE x15 each  Normal stance on Airex pad 2x60sec (requires minA for falls recovery 2 times)? Forward and backward stepping over hurdle x15 each direction  SIde stepping over hurdle 15x each direction   CGA and Min verbal cues used throughout with increased in postural sway and LOB most seen with narrow base of support and while on uneven surfaces. Continues to have balance deficits typical with diagnosis. Patient performs intermediate level exercises without pain behaviors and needs verbal  cuing for postural alignment                        PT Education - 01/23/18 1435    Education Details  HEP    Person(s) Educated  Patient    Methods  Explanation    Comprehension  Verbalized understanding;Need further instruction       PT Short Term Goals - 12/28/17 0921      PT SHORT TERM GOAL #1   Title  Patient will be independent in home exercise program to improve strength/mobility for better functional independence with ADLs.    Time  4    Period  Weeks    Status  New    Target Date  01/25/18      PT SHORT TERM GOAL #2   Title  Patient will increase Berg Balance score by > 6 points to demonstrate decreased fall risk during functional activities.    Time  4    Period  Weeks    Status  New    Target Date  01/25/18        PT Long Term Goals - 12/28/17 2683      PT LONG TERM GOAL #1   Title   Patient will be independent with ascend/descend 12 steps using single UE in step over step pattern without LOB.    Time   8    Period  Weeks    Status  New    Target Date  02/22/18      PT LONG TERM GOAL #2   Title  Patient will increase ABC scale score >80% to demonstrate better functional mobility and better confidence with ADLs.     Time  8    Status  New    Target Date  02/22/18      PT LONG TERM GOAL #3   Title  Patient will increase dynamic gait index score to >19/24 as to demonstrate reduced fall risk and improved dynamic gait balance for better safety with community/home ambulation.     Time  8    Period  Weeks    Status  New              Patient will benefit from skilled therapeutic intervention in order to improve the following deficits and impairments:     Visit Diagnosis: Difficulty in walking, not elsewhere classified  Other abnormalities of gait and mobility  Muscle weakness (generalized)     Problem List Patient Active Problem List   Diagnosis Date Noted  . Baker's cyst of knee, right 06/06/2017  . Insomnia 03/07/2017  . Tobacco abuse 12/01/2016  . Hypertension   . Hyperlipidemia   . MDD (major depressive disorder)   . Generalized anxiety disorder     Alanson Puls, [place] DPT 01/23/2018, 2:38 PM  [place] MAIN [facility] SERVICES [address] [place], [place], [address] Phone: [phone]   Fax:  [phone]  Name: Amanda Tucker MRN: [id number] Date of Birth: 1945-07-16

## 2018-01-30 ENCOUNTER — Ambulatory Visit: Payer: Medicare Other | Attending: Family Medicine | Admitting: Physical Therapy

## 2018-01-30 ENCOUNTER — Encounter: Payer: Self-pay | Admitting: Physical Therapy

## 2018-01-30 DIAGNOSIS — R2689 Other abnormalities of gait and mobility: Secondary | ICD-10-CM | POA: Diagnosis not present

## 2018-01-30 DIAGNOSIS — M6281 Muscle weakness (generalized): Secondary | ICD-10-CM | POA: Diagnosis not present

## 2018-01-30 DIAGNOSIS — R262 Difficulty in walking, not elsewhere classified: Secondary | ICD-10-CM | POA: Insufficient documentation

## 2018-01-30 NOTE — Therapy (Signed)
[place] MAIN [facility] SERVICES [address] [place], [place], [address] Phone: [phone]   Fax:  [phone]  Physical Therapy Treatment  Patient Details  Name: Amanda Tucker MRN: [id number] Date of Birth: 1945/06/17 Referring Provider (PT): [name]    Encounter Date: 01/30/2018  PT End of Session - 01/30/18 1436    Visit Number  6    Number of Visits  17    Date for PT Re-Evaluation  02/22/18    Authorization Type  6/10 eval date 12/28/17    PT Start Time  0230    PT Stop Time  0310    PT Time Calculation (min)  40 min    Equipment Utilized During Treatment  Gait belt    Activity Tolerance  Patient tolerated treatment well    Behavior During Therapy  [facility] for tasks assessed/performed       Past Medical History:  Diagnosis Date  . Anxiety   . Depression   . Diverticulitis   . Hyperlipidemia   . Hypertension   . Right sided sciatica     Past Surgical History:  Procedure Laterality Date  . NO PAST SURGERIES      There were no vitals filed for this visit.  Subjective Assessment - 01/30/18 1436    Subjective  Patient is having right knee stiffness. It is swollen.     Currently in Pain?  No/denies    Pain Score  0-No pain       Treatment: Leg press 90 lbs x 20 x 2   Rockerboard Lateral weight shift x10 reps each direction, no UE support, CGA for safety with VCs to control the board tap in each direction and not letting it hit too hard  AP weight shift x10 reps, no UE support, CGA for safety with VCs to utilize ankles to transfer weight over toes and back over heels without just leaning the trunk Airex pad ball tosses to self x2 min, CGA for safety, demonstrated difficulty with catching the ball on its return and required VCs to control the speed of toss to control the return speed Airex pad, balloon passes x2 min, supervision for safety with varying directions and speed of balloon, VCs for utilizing both hands and  minimizing UE support Agility ladder Step out, out, in, in forwards x2 laps, backwards x2 laps, CGA for safety, VCs to take big enough steps and to try to increase speed to work on coordination  Side stepping x1 lap each direction, CGA for safety, VCs for taking a big enough step to get both feet into the square  Gait out in hallway, CGA for all activities with VCs for maintaining gait speed and step length with activities: -Horizontal head turns x160 ft, calling out cards as walking to give point of focus -Vertical head turns x160 ft -Direction changes x80 ft, some difficulty with changing directions quickly and beginning to walk backwards                       PT Education - 01/30/18 1436    Education Details  HEP    Person(s) Educated  Patient    Methods  Explanation    Comprehension  Verbalized understanding;Need further instruction       PT Short Term Goals - 12/28/17 0921      PT SHORT TERM GOAL #1   Title  Patient will be independent in home exercise program to improve strength/mobility for better functional  independence with ADLs.    Time  4    Period  Weeks    Status  New    Target Date  01/25/18      PT SHORT TERM GOAL #2   Title  Patient will increase Berg Balance score by > 6 points to demonstrate decreased fall risk during functional activities.    Time  4    Period  Weeks    Status  New    Target Date  01/25/18        PT Long Term Goals - 12/28/17 6045      PT LONG TERM GOAL #1   Title   Patient will be independent with ascend/descend 12 steps using single UE in step over step pattern without LOB.    Time  8    Period  Weeks    Status  New    Target Date  02/22/18      PT LONG TERM GOAL #2   Title  Patient will increase ABC scale score >80% to demonstrate better functional mobility and better confidence with ADLs.     Time  8    Status  New    Target Date  02/22/18      PT LONG TERM GOAL #3   Title  Patient will increase dynamic  gait index score to >19/24 as to demonstrate reduced fall risk and improved dynamic gait balance for better safety with community/home ambulation.     Time  8    Period  Weeks    Status  New            Plan - 01/30/18 1437    Clinical Impression Statement  Patient demonstrates understanding of HEP with minimal corrections needed. Patient challenged with matrix balance activity with multiple standing rest periods due to fatigue. Weak LE combined with weak core musculature results in poor postural control and back pain. Patient will continue to benefit from skilled physical therapy to improve pain and mobility.    Rehab Potential  Good    PT Frequency  2x / week    PT Duration  8 weeks    PT Treatment/Interventions  Manual techniques;Balance training;Neuromuscular re-education;Patient/family education;Therapeutic exercise;Therapeutic activities;Stair training;Gait training    PT Next Visit Plan  review HEP, balance training    PT Home Exercise Plan  squats, heel raises, corner balance feet together /head turns, modified tandem in corner       Patient will benefit from skilled therapeutic intervention in order to improve the following deficits and impairments:  Abnormal gait, Decreased balance, Difficulty walking, Postural dysfunction, Impaired flexibility, Decreased strength, Decreased activity tolerance, Decreased mobility, Decreased endurance  Visit Diagnosis: Difficulty in walking, not elsewhere classified  Other abnormalities of gait and mobility  Muscle weakness (generalized)     Problem List Patient Active Problem List   Diagnosis Date Noted  . Baker's cyst of knee, right 06/06/2017  . Insomnia 03/07/2017  . Tobacco abuse 12/01/2016  . Hypertension   . Hyperlipidemia   . MDD (major depressive disorder)   . Generalized anxiety disorder     Alanson Puls, [place] DPT 01/30/2018, 3:19 PM  [place] MAIN [facility] SERVICES [address] [place], [place], [address] Phone: [phone]   Fax:  [phone]  Name: Amanda Tucker MRN: [id number] Date of Birth: [birth year]

## 2018-01-31 ENCOUNTER — Encounter: Payer: Self-pay | Admitting: *Deleted

## 2018-02-01 ENCOUNTER — Encounter: Payer: Self-pay | Admitting: Physical Therapy

## 2018-02-01 ENCOUNTER — Ambulatory Visit: Payer: Medicare Other | Admitting: Physical Therapy

## 2018-02-01 DIAGNOSIS — R2689 Other abnormalities of gait and mobility: Secondary | ICD-10-CM

## 2018-02-01 DIAGNOSIS — R262 Difficulty in walking, not elsewhere classified: Secondary | ICD-10-CM

## 2018-02-01 DIAGNOSIS — M6281 Muscle weakness (generalized): Secondary | ICD-10-CM | POA: Diagnosis not present

## 2018-02-01 NOTE — Therapy (Signed)
[place] MAIN [facility] SERVICES [address] [place], [place], [address] Phone: [phone]   Fax:  [phone]  Physical Therapy Treatment  Patient Details  Name: Amanda Tucker MRN: [id number] Date of Birth: 1945-07-31 Referring Provider (PT): [name]    Encounter Date: 02/01/2018  PT End of Session - 02/01/18 1436    Visit Number  7    Number of Visits  17    Date for PT Re-Evaluation  02/22/18    Authorization Type  7/10 eval date 12/28/17    PT Start Time  0230    PT Stop Time  0310    PT Time Calculation (min)  40 min    Equipment Utilized During Treatment  Gait belt    Activity Tolerance  Patient tolerated treatment well    Behavior During Therapy  [facility] for tasks assessed/performed       Past Medical History:  Diagnosis Date  . Anxiety   . Depression   . Diverticulitis   . Hyperlipidemia   . Hypertension   . Right sided sciatica     Past Surgical History:  Procedure Laterality Date  . NO PAST SURGERIES      There were no vitals filed for this visit.  Subjective Assessment - 02/01/18 1436    Subjective  Patient has noticed that she has stopped shuffling.     Pertinent History  Patient lives with her daughter and her husband. She does not use an AD and has had 2 recent falls.     Limitations  Standing;Walking    Patient Stated Goals  Patient wants to have better balance .     Currently in Pain?  No/denies    Pain Score  0-No pain    Pain Onset  In the past 7 days      Treatment:    [employer]  x 5 mins L 5   Resisted walking forward/backward 2.5 lbs x10. CGA    Leg press x 15 x 2 with 90 lbs, Verbal cues to complete slowly and not let knees snap back. 2nd set 105lbs   Single leg press 45# x15 each side. Verbal cues to slow down movement.     Step ups to 6 inch stool x 10. No UE suport. CGA   Eccentric step downs from 6 inch stool x 10 each LE. Visual cues for technique. Verbal cues to complete slow  and tap heel.  BUE CGA   Standing on airex pad taps on 6in step. No UE support   Supine hip flex x10 with 2# Ankle weight with each LE. Verbal cues to complete slowly   hooklying marching x 10 each LE with 2# AW. Verbal cues    Bridges x10 3 seconds holds.    LAQ x 10. Verbal cues to complete slowly     hooklying hip abd/ER with gTB x 20     sidelying hip abd with 2# Ankle weight x 10 BLE. [place] for positioning and correct alignment for exercise                          PT Education - 02/01/18 1436    Education Details  HEP    Person(s) Educated  Patient    Methods  Explanation    Comprehension  Verbalized understanding;Need further instruction       PT Short Term Goals - 12/28/17 0921      PT SHORT TERM GOAL #  1   Title  Patient will be independent in home exercise program to improve strength/mobility for better functional independence with ADLs.    Time  4    Period  Weeks    Status  New    Target Date  01/25/18      PT SHORT TERM GOAL #2   Title  Patient will increase Berg Balance score by > 6 points to demonstrate decreased fall risk during functional activities.    Time  4    Period  Weeks    Status  New    Target Date  01/25/18        PT Long Term Goals - 12/28/17 6440      PT LONG TERM GOAL #1   Title   Patient will be independent with ascend/descend 12 steps using single UE in step over step pattern without LOB.    Time  8    Period  Weeks    Status  New    Target Date  02/22/18      PT LONG TERM GOAL #2   Title  Patient will increase ABC scale score >80% to demonstrate better functional mobility and better confidence with ADLs.     Time  8    Status  New    Target Date  02/22/18      PT LONG TERM GOAL #3   Title  Patient will increase dynamic gait index score to >19/24 as to demonstrate reduced fall risk and improved dynamic gait balance for better safety with community/home ambulation.     Time  8    Period  Weeks    Status   New            Plan - 02/01/18 1437    Clinical Impression Statement  Patient instructed in strengthening with min assist. Patient continues to have deficits in strength of weakness decreased flexibility of LE's, gait with rollator and SBA dynamic standing balance deficits. Patient is improving with functional tasks including sit to stand, and is making functional improvements, and improving in her outcome measures.  Patient will benefit from continued skilled PT to improve dynamic standing balance, strength, gait, decrease pain, and reach functional goals. Patient needs several rest periods throughout treatment due to fatigue    Rehab Potential  Good    PT Frequency  2x / week    PT Duration  8 weeks    PT Treatment/Interventions  Manual techniques;Balance training;Neuromuscular re-education;Patient/family education;Therapeutic exercise;Therapeutic activities;Stair training;Gait training    PT Next Visit Plan  review HEP, balance training    PT Home Exercise Plan  squats, heel raises, corner balance feet together /head turns, modified tandem in corner       Patient will benefit from skilled therapeutic intervention in order to improve the following deficits and impairments:  Abnormal gait, Decreased balance, Difficulty walking, Postural dysfunction, Impaired flexibility, Decreased strength, Decreased activity tolerance, Decreased mobility, Decreased endurance  Visit Diagnosis: Difficulty in walking, not elsewhere classified  Muscle weakness (generalized)  Other abnormalities of gait and mobility     Problem List Patient Active Problem List   Diagnosis Date Noted  . Baker's cyst of knee, right 06/06/2017  . Insomnia 03/07/2017  . Tobacco abuse 12/01/2016  . Hypertension   . Hyperlipidemia   . MDD (major depressive disorder)   . Generalized anxiety disorder     Alanson Puls, [place] DPT 02/01/2018, 2:38 PM  [place] MAIN [facility]  SERVICES [phone]  [place], [place], [address] Phone: [phone]   Fax:  [phone]  Name: Amanda Tucker MRN: [id number] Date of Birth: February 25, 1945

## 2018-02-03 ENCOUNTER — Telehealth: Payer: Self-pay

## 2018-02-03 ENCOUNTER — Other Ambulatory Visit: Payer: Self-pay

## 2018-02-03 DIAGNOSIS — Z1211 Encounter for screening for malignant neoplasm of colon: Secondary | ICD-10-CM

## 2018-02-03 NOTE — Telephone Encounter (Signed)
Colonoscopy has been scheduled with Dr. Vicente Males for 02/21/18 at [facility].  Thanks [employer]

## 2018-02-03 NOTE — Telephone Encounter (Signed)
Returned patients call to schedule her for colonoscopy.  LVM for her to call back.  Thanks [employer]

## 2018-02-03 NOTE — Telephone Encounter (Signed)
Pt left vm she would like to schedule a colonoscopy

## 2018-02-03 NOTE — Telephone Encounter (Signed)
Pt is calling for [place] cb [phone]

## 2018-02-06 ENCOUNTER — Encounter: Payer: Self-pay | Admitting: Physical Therapy

## 2018-02-06 ENCOUNTER — Ambulatory Visit: Payer: Medicare Other | Admitting: Physical Therapy

## 2018-02-06 DIAGNOSIS — R262 Difficulty in walking, not elsewhere classified: Secondary | ICD-10-CM

## 2018-02-06 DIAGNOSIS — M6281 Muscle weakness (generalized): Secondary | ICD-10-CM

## 2018-02-06 DIAGNOSIS — R2689 Other abnormalities of gait and mobility: Secondary | ICD-10-CM

## 2018-02-06 NOTE — Therapy (Signed)
[place] MAIN [facility] SERVICES [address] [place], [place], [address] Phone: [phone]   Fax:  [phone]  Physical Therapy Treatment  Patient Details  Name: Amanda Tucker MRN: [id number] Date of Birth: 07/09/1945 Referring Provider (PT): [name]    Encounter Date: 02/06/2018  PT End of Session - 02/06/18 1438    Visit Number  8    Number of Visits  17    Date for PT Re-Evaluation  02/22/18    Authorization Type  8/10 eval date 12/28/17    PT Start Time  0231    PT Stop Time  0310    PT Time Calculation (min)  39 min    Equipment Utilized During Treatment  Gait belt    Activity Tolerance  Patient tolerated treatment well    Behavior During Therapy  [facility] for tasks assessed/performed       Past Medical History:  Diagnosis Date  . Anxiety   . Depression   . Diverticulitis   . Hyperlipidemia   . Hypertension   . Right sided sciatica     Past Surgical History:  Procedure Laterality Date  . NO PAST SURGERIES      There were no vitals filed for this visit.  Subjective Assessment - 02/06/18 1437    Subjective  Patient reports that she is feeling somewhat better.    Currently in Pain?  No/denies    Pain Score  0-No pain     Treatment:  [employer]  x 5 mins L 5  Side stepping on TM  . 2 m/sec x 2 mins left and right , very irregular stepping and uneven steps with mod assist.    Resisted walking forward/backward 2.5 lbs x10. CGA    Leg press x 15 x 2 with 90 lbs, Verbal cues to complete slowly and not let knees snap back. 2nd set 105lbs   Single leg press 45# x15 each side. Verbal cues to slow down movement.     Step ups to 6 inch stool x 10. No UE suport. CGA   Eccentric step downs from 6 inch stool x 10 each LE. Visual cues for technique. Verbal cues to complete slow and tap heel.  BUE CGA   Standing on airex pad taps on 6in step. No UE support    Cues for proper technique of exercises, slow eccentric  contractions to target specific muscles                       PT Education - 02/06/18 1437    Education Details  HEP    Person(s) Educated  Patient    Methods  Explanation    Comprehension  Verbalized understanding;Need further instruction       PT Short Term Goals - 12/28/17 0921      PT SHORT TERM GOAL #1   Title  Patient will be independent in home exercise program to improve strength/mobility for better functional independence with ADLs.    Time  4    Period  Weeks    Status  New    Target Date  01/25/18      PT SHORT TERM GOAL #2   Title  Patient will increase Berg Balance score by > 6 points to demonstrate decreased fall risk during functional activities.    Time  4    Period  Weeks    Status  New    Target Date  01/25/18  PT Long Term Goals - 12/28/17 5631      PT LONG TERM GOAL #1   Title   Patient will be independent with ascend/descend 12 steps using single UE in step over step pattern without LOB.    Time  8    Period  Weeks    Status  New    Target Date  02/22/18      PT LONG TERM GOAL #2   Title  Patient will increase ABC scale score >80% to demonstrate better functional mobility and better confidence with ADLs.     Time  8    Status  New    Target Date  02/22/18      PT LONG TERM GOAL #3   Title  Patient will increase dynamic gait index score to >19/24 as to demonstrate reduced fall risk and improved dynamic gait balance for better safety with community/home ambulation.     Time  8    Period  Weeks    Status  New            Plan - 02/06/18 1438    Clinical Impression Statement  Dynamic and static balance interventions continued today, with a focus on unilateral LE stability.  Pt has pain in low back with all exercises well.  Intermediate dynamic standing balance tasks were progressed with cga needed for reaching outside of her base of support.  Pt would continue to benefit from skilled therapy services in order to  address balance deficits in order to decrease fall risk and improve mobility    Rehab Potential  Good    PT Frequency  2x / week    PT Duration  8 weeks    PT Treatment/Interventions  Manual techniques;Balance training;Neuromuscular re-education;Patient/family education;Therapeutic exercise;Therapeutic activities;Stair training;Gait training    PT Next Visit Plan  review HEP, balance training    PT Home Exercise Plan  squats, heel raises, corner balance feet together /head turns, modified tandem in corner       Patient will benefit from skilled therapeutic intervention in order to improve the following deficits and impairments:  Abnormal gait, Decreased balance, Difficulty walking, Postural dysfunction, Impaired flexibility, Decreased strength, Decreased activity tolerance, Decreased mobility, Decreased endurance  Visit Diagnosis: Difficulty in walking, not elsewhere classified  Muscle weakness (generalized)  Other abnormalities of gait and mobility     Problem List Patient Active Problem List   Diagnosis Date Noted  . Baker's cyst of knee, right 06/06/2017  . Insomnia 03/07/2017  . Tobacco abuse 12/01/2016  . Hypertension   . Hyperlipidemia   . MDD (major depressive disorder)   . Generalized anxiety disorder     Alanson Puls, [place] DPT 02/06/2018, 2:39 PM  [place] MAIN [facility] SERVICES [address] [place], [place], [address] Phone: [phone]   Fax:  [phone]  Name: Amanda Tucker MRN: [id number] Date of Birth: [birth year]

## 2018-02-08 ENCOUNTER — Ambulatory Visit: Payer: Medicare Other | Admitting: Physical Therapy

## 2018-02-09 ENCOUNTER — Ambulatory Visit: Payer: Medicare Other | Admitting: Physical Therapy

## 2018-02-09 ENCOUNTER — Encounter: Payer: Self-pay | Admitting: Physical Therapy

## 2018-02-09 DIAGNOSIS — M6281 Muscle weakness (generalized): Secondary | ICD-10-CM

## 2018-02-09 DIAGNOSIS — R2689 Other abnormalities of gait and mobility: Secondary | ICD-10-CM

## 2018-02-09 DIAGNOSIS — R262 Difficulty in walking, not elsewhere classified: Secondary | ICD-10-CM

## 2018-02-09 NOTE — Therapy (Signed)
[place] MAIN [facility] SERVICES [address] [place], [place], [address] Phone: [phone]   Fax:  [phone]  Physical Therapy Treatment  Patient Details  Name: Amanda Tucker MRN: [id number] Date of Birth: 1945/03/13 Referring Provider (PT): [name]    Encounter Date: 02/09/2018  PT End of Session - 02/09/18 1354    Visit Number  9    Number of Visits  17    Date for PT Re-Evaluation  02/22/18    Authorization Type  9/10 eval date 12/28/17    PT Start Time  0145    PT Stop Time  0230    PT Time Calculation (min)  45 min    Equipment Utilized During Treatment  Gait belt    Activity Tolerance  Patient tolerated treatment well    Behavior During Therapy  [facility] for tasks assessed/performed       Past Medical History:  Diagnosis Date  . Anxiety   . Depression   . Diverticulitis   . Hyperlipidemia   . Hypertension   . Right sided sciatica     Past Surgical History:  Procedure Laterality Date  . NO PAST SURGERIES      There were no vitals filed for this visit.  Subjective Assessment - 02/09/18 1353    Subjective  Patient reports that she is feeling somewhat better., but she is having a wobblie day.,    Pertinent History  Patient lives with her daughter and her husband. She does not use an AD and has had 2 recent falls.     Limitations  Standing;Walking    Patient Stated Goals  Patient wants to have better balance .     Currently in Pain?  No/denies    Pain Score  0-No pain    Pain Onset  In the past 7 days       TREATMENT: Warm up on nu-step level 2 x5  min (Unbilled):   Standing on airex: Trunk rotation side to side with ball BUE hold and head turning with trunk x 10  Alternate toe taps on 4 inch step without rail assist x15 with cues to step backwards for better foot placement and avoid toe catching; Modified tandem stance with BUE ball up/down x10 each foot in front with CGA for safety; Four square side to side  and fwd/ bwd and diagonals x 5  Leg press 60 lbs x 20 x 2    Standing on airex beam:  Tandem stance on airex beam without rail assist 10 sec hold x4 each foot in front with CGA for safety and cues to improve upper trunk control for better balance control; Side stepping down airex beam without rail assist x3 laps each direction with cues to keep feet on beam and avoid stepping off; Standing with feet apart, BUE ball toss x10 unsupported with close supervision; Patient exhibits posterior loss of balance requiring cues for forward weight shift;   Standing on 1/2 bolster (flat side up) Feet flat, apart, BUE wand flexion x10 reps with min A For safety; Patient able to keep balance well with minimal posterior loss of balance; stepping over hurdle x 10 fwd / bwd, 10 side to side with UE support   CGA and mod verbal cues used throughout with increased in postural sway and LOB most seen with narrow base of support and while on uneven surfaces. Continues to have balance deficits typical with diagnosis. Patient performs intermediate level exercises without pain behaviors and needs verbal  cuing for postural alignment and head positioning                      PT Education - 02/09/18 1354    Education Details  HEP    Person(s) Educated  Patient    Methods  Explanation    Comprehension  Verbalized understanding;Returned demonstration;Need further instruction       PT Short Term Goals - 12/28/17 9024      PT SHORT TERM GOAL #1   Title  Patient will be independent in home exercise program to improve strength/mobility for better functional independence with ADLs.    Time  4    Period  Weeks    Status  New    Target Date  01/25/18      PT SHORT TERM GOAL #2   Title  Patient will increase Berg Balance score by > 6 points to demonstrate decreased fall risk during functional activities.    Time  4    Period  Weeks    Status  New    Target Date  01/25/18        PT Long Term  Goals - 12/28/17 0973      PT LONG TERM GOAL #1   Title   Patient will be independent with ascend/descend 12 steps using single UE in step over step pattern without LOB.    Time  8    Period  Weeks    Status  New    Target Date  02/22/18      PT LONG TERM GOAL #2   Title  Patient will increase ABC scale score >80% to demonstrate better functional mobility and better confidence with ADLs.     Time  8    Status  New    Target Date  02/22/18      PT LONG TERM GOAL #3   Title  Patient will increase dynamic gait index score to >19/24 as to demonstrate reduced fall risk and improved dynamic gait balance for better safety with community/home ambulation.     Time  8    Period  Weeks    Status  New            Plan - 02/09/18 1356    Clinical Impression Statement  Patient demonstrates understanding of HEP with minimal corrections needed. Patient challenged with changes in gait speed and head movements with multiple repetitions due to fatigue. Weak LE combined with weak core musculature results in poor postural control and back pain. Patient will continue to benefit from skilled physical therapy to improve pain and mobility    Rehab Potential  Good    PT Frequency  2x / week    PT Duration  8 weeks    PT Treatment/Interventions  Manual techniques;Balance training;Neuromuscular re-education;Patient/family education;Therapeutic exercise;Therapeutic activities;Stair training;Gait training    PT Next Visit Plan  review HEP, balance training    PT Home Exercise Plan  squats, heel raises, corner balance feet together /head turns, modified tandem in corner       Patient will benefit from skilled therapeutic intervention in order to improve the following deficits and impairments:  Abnormal gait, Decreased balance, Difficulty walking, Postural dysfunction, Impaired flexibility, Decreased strength, Decreased activity tolerance, Decreased mobility, Decreased endurance  Visit Diagnosis: Difficulty  in walking, not elsewhere classified  Muscle weakness (generalized)  Other abnormalities of gait and mobility     Problem List Patient Active Problem List   Diagnosis Date Noted  .  Baker's cyst of knee, right 06/06/2017  . Insomnia 03/07/2017  . Tobacco abuse 12/01/2016  . Hypertension   . Hyperlipidemia   . MDD (major depressive disorder)   . Generalized anxiety disorder     Alanson Puls, [place] DPT 02/09/2018, 1:58 PM  [place] MAIN [facility] SERVICES [address] [place], [place], [address] Phone: [phone]   Fax:  [phone]  Name: Amanda Tucker MRN: [id number] Date of Birth: 1945-01-28

## 2018-02-10 ENCOUNTER — Telehealth: Payer: Self-pay | Admitting: Gastroenterology

## 2018-02-10 NOTE — Telephone Encounter (Signed)
Patient called to cancel her colonoscopy scheduled 02-21-2018 with Dr Vicente Males because she has no one to stay with her. She would like to re-schedule  After February 3rd she will have someone to stay with her.

## 2018-02-10 NOTE — Telephone Encounter (Signed)
Received request to cancel patients colonoscopy.  LVM to let her know message was received and procedure has been canceled.  Asked her to call back when she is ready to reschedule.  Thanks [employer]

## 2018-02-10 NOTE — Telephone Encounter (Signed)
Patient called back and would like to schedule her colonoscopy for 02-27-2018

## 2018-02-13 ENCOUNTER — Other Ambulatory Visit: Payer: Self-pay

## 2018-02-13 ENCOUNTER — Encounter: Payer: Self-pay | Admitting: Physical Therapy

## 2018-02-13 ENCOUNTER — Ambulatory Visit: Payer: Medicare Other | Admitting: Physical Therapy

## 2018-02-13 DIAGNOSIS — Z1211 Encounter for screening for malignant neoplasm of colon: Secondary | ICD-10-CM

## 2018-02-13 DIAGNOSIS — R262 Difficulty in walking, not elsewhere classified: Secondary | ICD-10-CM

## 2018-02-13 DIAGNOSIS — R2689 Other abnormalities of gait and mobility: Secondary | ICD-10-CM

## 2018-02-13 DIAGNOSIS — M6281 Muscle weakness (generalized): Secondary | ICD-10-CM | POA: Diagnosis not present

## 2018-02-13 NOTE — Telephone Encounter (Signed)
Call returned to patient this am to reschedule her colonoscopy to 02/27/18.  Informed patient that this date is avalable and I've placed her on the schedule.  Asked if she had any questions regarding her colonoscopy.  She said no and asked her to call me back if she does.  Thanks Maris Abascal

## 2018-02-13 NOTE — Therapy (Signed)
[place] MAIN [facility] SERVICES [address] [place], [place], [address] Phone: [phone]   Fax:  [phone]  Physical Therapy Treatment Physical Therapy Progress Note   Dates of reporting period    12/28/17 to   02/13/18  Patient Details  Name: Amanda Tucker MRN: [id number] Date of Birth: 08/20/1945 Referring Provider (PT): [name]    Encounter Date: 02/13/2018  PT End of Session - 02/13/18 1434    Visit Number  10    Number of Visits  17    Date for PT Re-Evaluation  04/10/18    Authorization Type  10/10 eval date 12/28/17    PT Start Time  0230    PT Stop Time  0310    PT Time Calculation (min)  40 min    Equipment Utilized During Treatment  Gait belt    Activity Tolerance  Patient tolerated treatment well    Behavior During Therapy  [facility] for tasks assessed/performed       Past Medical History:  Diagnosis Date  . Anxiety   . Depression   . Diverticulitis   . Hyperlipidemia   . Hypertension   . Right sided sciatica     Past Surgical History:  Procedure Laterality Date  . NO PAST SURGERIES      There were no vitals filed for this visit.  Subjective Assessment - 02/13/18 1433    Subjective  Patient reports that she is having some dizziness .    Pertinent History  Patient lives with her daughter and her husband. She does not use an AD and has had 2 recent falls.     Limitations  Standing;Walking    Patient Stated Goals  Patient wants to have better balance .     Currently in Pain?  No/denies    Pain Score  0-No pain    Pain Onset  In the past 7 days         Treatment:  Patient performs outcome measures to address goals: Patient made progress towards all goals    OUTCOME MEASURES: TEST Outcome Interpretation  5 times sit<>stand 12.00sec >34 yo, >15 sec indicates increased risk for falls  10 meter walk test           1.36      m/s <1.0 m/s indicates increased risk for falls; limited community ambulator   Timed up and Go      9.5       sec <14 sec indicates increased risk for falls      Berg Balance Assessment 47/56 <36/56 (100% risk for falls), 37-45 (80% risk for falls); 46-51 (>50% risk for falls); 52-55 (lower risk <25% of falls)  DGI  17/20 20/20 WNL   Patient demonstrates improved stability and strength allowing patient to perform BERG balance, DGI, ABS scale, TUG, 10 MW , 5 x sit to stand with better outcomes. She will continue to benefit from skilled PT to improve balance and mobility.    Patient demonstrates improved stability and strength allowing patient to perform BERG balance, DGI, ABS scale, TUG, 10 MW , 5 x sit to stand with better outcomes. Patient reports having some dizziness and possibly benefit from the [place]  treatment.  She will continue to benefit from skilled PT to improve balance and mobility.                       PT Education - 02/13/18 1434  Education Details  HEP    Person(s) Educated  Patient;Child(ren)    Methods  Explanation    Comprehension  Verbalized understanding;Returned demonstration       PT Short Term Goals - 02/13/18 1526      PT SHORT TERM GOAL #1   Title  Patient will be independent in home exercise program to improve strength/mobility for better functional independence with ADLs.    Time  4    Period  Weeks    Status  Achieved    Target Date  02/13/18      PT SHORT TERM GOAL #2   Title  Patient will increase Berg Balance score by > 6 points to demonstrate decreased fall risk during functional activities.    Baseline  47/56 berg 02/13/18    Time  4    Period  Weeks    Status  Partially Met        PT Long Term Goals - 02/13/18 1436      PT LONG TERM GOAL #1   Title   Patient will be independent with ascend/descend 12 steps using single UE in step over step pattern without LOB.    Time  8    Period  Weeks    Status  Partially Met    Target Date  02/22/18      PT LONG TERM GOAL #2   Title  Patient  will increase ABC scale score >80% to demonstrate better functional mobility and better confidence with ADLs.     Baseline   65% 02/13/18    Time  8    Status  Partially Met    Target Date  02/22/18      PT LONG TERM GOAL #3   Title  Patient will increase dynamic gait index score to >19/24 as to demonstrate reduced fall risk and improved dynamic gait balance for better safety with community/home ambulation.     Baseline  02/13/18=17/20    Time  8    Period  Weeks    Status  Partially Met    Target Date  02/22/18            Plan - 02/13/18 1531    Clinical Impression Statement  Patient demonstrates improved stability and strength allowing patient to perform BERG balance, DGI, ABS scale, TUG, 10 MW , 5 x sit to stand with better outcomes. Patient reports having some dizziness and possibly benefit from the [place]  treatment.  She will continue to benefit from skilled PT to improve balance and mobility.    Rehab Potential  Good    PT Frequency  2x / week    PT Duration  8 weeks    PT Treatment/Interventions  Manual techniques;Balance training;Neuromuscular re-education;Patient/family education;Therapeutic exercise;Therapeutic activities;Stair training;Gait training;Canalith Repostioning    PT Next Visit Plan  review HEP, balance training    PT Home Exercise Plan  squats, heel raises, corner balance feet together /head turns, modified tandem in corner       Patient will benefit from skilled therapeutic intervention in order to improve the following deficits and impairments:  Abnormal gait, Decreased balance, Difficulty walking, Postural dysfunction, Impaired flexibility, Decreased strength, Decreased activity tolerance, Decreased mobility, Decreased endurance  Visit Diagnosis: Difficulty in walking, not elsewhere classified - Plan: PT plan of care cert/re-cert  Muscle weakness (generalized) - Plan: PT plan of care cert/re-cert  Other abnormalities of gait and mobility - Plan:  PT plan of care cert/re-cert  Problem List Patient Active Problem List   Diagnosis Date Noted  . Baker's cyst of knee, right 06/06/2017  . Insomnia 03/07/2017  . Tobacco abuse 12/01/2016  . Hypertension   . Hyperlipidemia   . MDD (major depressive disorder)   . Generalized anxiety disorder     Alanson Puls, [place] DPT 02/13/2018, 4:00 PM  [place] MAIN [facility] SERVICES [address] [place], [place], [address] Phone: [phone]   Fax:  [phone]  Name: Galia Rahm MRN: [id number] Date of Birth: 09-23-1945

## 2018-02-15 ENCOUNTER — Ambulatory Visit: Payer: Medicare Other | Admitting: Physical Therapy

## 2018-02-15 ENCOUNTER — Encounter: Payer: Self-pay | Admitting: Physical Therapy

## 2018-02-15 DIAGNOSIS — R2689 Other abnormalities of gait and mobility: Secondary | ICD-10-CM | POA: Diagnosis not present

## 2018-02-15 DIAGNOSIS — M6281 Muscle weakness (generalized): Secondary | ICD-10-CM | POA: Diagnosis not present

## 2018-02-15 DIAGNOSIS — R262 Difficulty in walking, not elsewhere classified: Secondary | ICD-10-CM | POA: Diagnosis not present

## 2018-02-15 NOTE — Therapy (Signed)
[place] MAIN [facility] SERVICES [address] [place], [place], [address] Phone: [phone]   Fax:  [phone]  Physical Therapy Treatment  Patient Details  Name: Amanda Tucker MRN: [id number] Date of Birth: January 19, 1946 Referring Provider (PT): [name]    Encounter Date: 02/15/2018  PT End of Session - 02/15/18 1510    Visit Number  11    Number of Visits  17    Date for PT Re-Evaluation  04/10/18    Authorization Type  1/10 eval date 12/28/17    PT Start Time  0230    PT Stop Time  0314    PT Time Calculation (min)  44 min    Equipment Utilized During Treatment  Gait belt    Activity Tolerance  Patient tolerated treatment well    Behavior During Therapy  [facility] for tasks assessed/performed       Past Medical History:  Diagnosis Date  . Anxiety   . Depression   . Diverticulitis   . Hyperlipidemia   . Hypertension   . Right sided sciatica     Past Surgical History:  Procedure Laterality Date  . NO PAST SURGERIES      There were no vitals filed for this visit.  Subjective Assessment - 02/15/18 1509    Subjective  Patient reports that she is not having any dizziness .    Pertinent History  Patient lives with her daughter and her husband. She does not use an AD and has had 2 recent falls.     Limitations  Standing;Walking    Patient Stated Goals  Patient wants to have better balance .     Currently in Pain?  No/denies    Pain Score  0-No pain    Pain Onset  In the past 7 days         Treatment:  Miguel Dibble -hall pike left and right with negative results   Gait in the hallway with head turns left and right, head up and down movements with gait, speed changes for 100 feet x 8   TM side stepping with head turns left and right x 5 mins , . 2 miles / hour x 3 mins left side and 3 mins right side  Leg press 45 lbs x 20 x 3 sets  Standing in parallel bars and head turns on foam x 2 mins   No reports of pain and no reports of  dizziness. CGA and Min to mod verbal cues used throughout with increased in postural sway and LOB most seen with narrow base of support and while on uneven surfaces.                        PT Education - 02/15/18 1510    Education Details  HEP    Person(s) Educated  Patient    Methods  Explanation    Comprehension  Verbalized understanding;Returned demonstration;Need further instruction       PT Short Term Goals - 02/13/18 1526      PT SHORT TERM GOAL #1   Title  Patient will be independent in home exercise program to improve strength/mobility for better functional independence with ADLs.    Time  4    Period  Weeks    Status  Achieved    Target Date  02/13/18      PT SHORT TERM GOAL #2   Title  Patient will increase Berg Balance score by > 6  points to demonstrate decreased fall risk during functional activities.    Baseline  47/56 berg 02/13/18    Time  4    Period  Weeks    Status  Partially Met        PT Long Term Goals - 02/13/18 1436      PT LONG TERM GOAL #1   Title   Patient will be independent with ascend/descend 12 steps using single UE in step over step pattern without LOB.    Time  8    Period  Weeks    Status  Partially Met    Target Date  02/22/18      PT LONG TERM GOAL #2   Title  Patient will increase ABC scale score >80% to demonstrate better functional mobility and better confidence with ADLs.     Baseline   65% 02/13/18    Time  8    Status  Partially Met    Target Date  02/22/18      PT LONG TERM GOAL #3   Title  Patient will increase dynamic gait index score to >19/24 as to demonstrate reduced fall risk and improved dynamic gait balance for better safety with community/home ambulation.     Baseline  02/13/18=17/20    Time  8    Period  Weeks    Status  Partially Met    Target Date  02/22/18            Plan - 02/15/18 1514    Clinical Impression Statement  Patient was negative for dix-hall pike test left and right ear  with no symptoms. She performed dynamic balance exercises with head  turns and gait speed changes to challenge her with some path deviation and staggering steps noted. She performed BLE strengthenig with no reports of pain. Patient will continue to benefit from skilled PT to improve saftey and gait.     Rehab Potential  Good    PT Frequency  2x / week    PT Duration  8 weeks    PT Treatment/Interventions  Manual techniques;Balance training;Neuromuscular re-education;Patient/family education;Therapeutic exercise;Therapeutic activities;Stair training;Gait training;Canalith Repostioning    PT Next Visit Plan  review HEP, balance training    PT Home Exercise Plan  squats, heel raises, corner balance feet together /head turns, modified tandem in corner       Patient will benefit from skilled therapeutic intervention in order to improve the following deficits and impairments:  Abnormal gait, Decreased balance, Difficulty walking, Postural dysfunction, Impaired flexibility, Decreased strength, Decreased activity tolerance, Decreased mobility, Decreased endurance  Visit Diagnosis: Difficulty in walking, not elsewhere classified  Muscle weakness (generalized)  Other abnormalities of gait and mobility     Problem List Patient Active Problem List   Diagnosis Date Noted  . Baker's cyst of knee, right 06/06/2017  . Insomnia 03/07/2017  . Tobacco abuse 12/01/2016  . Hypertension   . Hyperlipidemia   . MDD (major depressive disorder)   . Generalized anxiety disorder     Alanson Puls, [place] DPT 02/15/2018, 3:26 PM  [place] MAIN [facility] SERVICES [address] [place], [place], [address] Phone: [phone]   Fax:  [phone]  Name: Alichia Alridge MRN: [id number] Date of Birth: 04-16-1945

## 2018-02-20 ENCOUNTER — Ambulatory Visit: Payer: Medicare Other

## 2018-02-20 ENCOUNTER — Encounter: Payer: Self-pay | Admitting: Physical Therapy

## 2018-02-20 DIAGNOSIS — R2689 Other abnormalities of gait and mobility: Secondary | ICD-10-CM | POA: Diagnosis not present

## 2018-02-20 DIAGNOSIS — M6281 Muscle weakness (generalized): Secondary | ICD-10-CM | POA: Diagnosis not present

## 2018-02-20 DIAGNOSIS — R262 Difficulty in walking, not elsewhere classified: Secondary | ICD-10-CM

## 2018-02-20 NOTE — Therapy (Signed)
[place] MAIN [facility] SERVICES [address] [place], [place], [address] Phone: [phone]   Fax:  [phone]  Physical Therapy Treatment  Patient Details  Name: Amanda Tucker MRN: [id number] Date of Birth: 02/26/1945 Referring Provider (PT): [name]    Encounter Date: 02/20/2018  PT End of Session - 02/20/18 1651    Visit Number  12    Number of Visits  17    Date for PT Re-Evaluation  04/10/18    Authorization Type  2/10 eval date 12/28/17    PT Start Time  1430    PT Stop Time  1515    PT Time Calculation (min)  45 min    Equipment Utilized During Treatment  Gait belt    Activity Tolerance  Patient tolerated treatment well    Behavior During Therapy  [facility] for tasks assessed/performed       Past Medical History:  Diagnosis Date  . Anxiety   . Depression   . Diverticulitis   . Hyperlipidemia   . Hypertension   . Right sided sciatica     Past Surgical History:  Procedure Laterality Date  . NO PAST SURGERIES      There were no vitals filed for this visit.  Subjective Assessment - 02/20/18 1647    Subjective  Patient without complaint at start of session.    Pertinent History  Patient lives with her daughter and her husband. She does not use an AD and has had 2 recent falls.     Limitations  Standing;Walking    Patient Stated Goals  Patient wants to have better balance .     Currently in Pain?  No/denies      Gait Training:  Gait in the hallway with head turns left and right, head up and down movements with gait, speed changes for 100 feet x 8  Hallway side stepping with extensive verbal/visual cues for foot placement to maintain exercise form Gait speed changes from slow-normal-fast and sudden stops with PT cueing activity, CGA-minAx1 for safety. Pt most challenged by slower ambulation.   Neuromuscular re-edu: Standing in parallel bars and head turns on foam x 2 mins  Trunk rotation side to side with BUE hold  and head turning with trunk x 10 tactile cues for full rotation bilaterally Alternate toe taps on 4 inch step without rail assist 2x10 with cues to step backwards for better foot placement, upright posture Side taps from foam to 4 in step bilaterally, CGA, x12 ea side, verbal cues for foot clearance Modified tandem stance with BUE flexion 2x10 each foot in front with CGA for safety;  Side stepping down airex beam without rail assist x4 laps each direction with cues to keep feet on beam and avoid stepping off;  Standing with feet close together, BUE balloon taps x52mns unsupported with close supervision; Standing on 1/2 bolster (flat side up) 2x10 with intermittent UE support for DF/PF control Leg press 45 lbs x 20 x 3 sets verbal cues for eccentric control    PT Education - 02/20/18 1648    Education Details  step length, postural control, exercise technique.    Person(s) Educated  Patient    Methods  Explanation;Demonstration    Comprehension  Verbalized understanding;Returned demonstration;Need further instruction       PT Short Term Goals - 02/13/18 1526      PT SHORT TERM GOAL #1   Title  Patient will be independent in home exercise program to improve strength/mobility  for better functional independence with ADLs.    Time  4    Period  Weeks    Status  Achieved    Target Date  02/13/18      PT SHORT TERM GOAL #2   Title  Patient will increase Berg Balance score by > 6 points to demonstrate decreased fall risk during functional activities.    Baseline  47/56 berg 02/13/18    Time  4    Period  Weeks    Status  Partially Met        PT Long Term Goals - 02/13/18 1436      PT LONG TERM GOAL #1   Title   Patient will be independent with ascend/descend 12 steps using single UE in step over step pattern without LOB.    Time  8    Period  Weeks    Status  Partially Met    Target Date  02/22/18      PT LONG TERM GOAL #2   Title  Patient will increase ABC scale score >80% to  demonstrate better functional mobility and better confidence with ADLs.     Baseline   65% 02/13/18    Time  8    Status  Partially Met    Target Date  02/22/18      PT LONG TERM GOAL #3   Title  Patient will increase dynamic gait index score to >19/24 as to demonstrate reduced fall risk and improved dynamic gait balance for better safety with community/home ambulation.     Baseline  02/13/18=17/20    Time  8    Period  Weeks    Status  Partially Met    Target Date  02/22/18            Plan - 02/20/18 1649    Clinical Impression Statement  Patient with good tolerance to session, only occasional seated rest breaks needed. Pt most challenged by slowed ambulation to focus on balance in stance phase and step length, CGA-minAx1 to maintain balance. Pt also CGA-minAx1 with narrow base of support and uneven surfaces this session with mild improvement in posture/exercise technique with cueing. The patient would benefit from further skilled PT to continue to progress towards goals.     Rehab Potential  Good    PT Frequency  2x / week    PT Duration  8 weeks    PT Treatment/Interventions  Manual techniques;Balance training;Neuromuscular re-education;Patient/family education;Therapeutic exercise;Therapeutic activities;Stair training;Gait training;Canalith Repostioning    PT Next Visit Plan  review HEP, balance training    PT Home Exercise Plan  squats, heel raises, corner balance feet together /head turns, modified tandem in corner    Consulted and Agree with Plan of Care  Patient       Patient will benefit from skilled therapeutic intervention in order to improve the following deficits and impairments:  Abnormal gait, Decreased balance, Difficulty walking, Postural dysfunction, Impaired flexibility, Decreased strength, Decreased activity tolerance, Decreased mobility, Decreased endurance  Visit Diagnosis: Difficulty in walking, not elsewhere classified  Muscle weakness  (generalized)  Other abnormalities of gait and mobility     Problem List Patient Active Problem List   Diagnosis Date Noted  . Baker's cyst of knee, right 06/06/2017  . Insomnia 03/07/2017  . Tobacco abuse 12/01/2016  . Hypertension   . Hyperlipidemia   . MDD (major depressive disorder)   . Generalized anxiety disorder     Lieutenant Diego PT, DPT 4:54 PM,02/20/18 [phone]  [place] [facility] SERVICES [address] [place], [place], [address] Phone: [phone]   Fax:  [phone]  Name: Amanda Tucker MRN: [id number] Date of Birth: [birth year]

## 2018-02-21 ENCOUNTER — Ambulatory Visit: Admit: 2018-02-21 | Payer: Medicare Other | Admitting: Gastroenterology

## 2018-02-21 SURGERY — COLONOSCOPY WITH PROPOFOL
Anesthesia: General

## 2018-02-22 ENCOUNTER — Ambulatory Visit: Payer: Medicare Other | Admitting: Physical Therapy

## 2018-02-22 ENCOUNTER — Encounter: Payer: Self-pay | Admitting: Physical Therapy

## 2018-02-22 DIAGNOSIS — R2689 Other abnormalities of gait and mobility: Secondary | ICD-10-CM

## 2018-02-22 DIAGNOSIS — M6281 Muscle weakness (generalized): Secondary | ICD-10-CM

## 2018-02-22 DIAGNOSIS — R262 Difficulty in walking, not elsewhere classified: Secondary | ICD-10-CM

## 2018-02-22 NOTE — Therapy (Signed)
[place] MAIN [facility] SERVICES [address] [place], [place], [address] Phone: [phone]   Fax:  [phone]  Physical Therapy Treatment  Patient Details  Name: Amanda Tucker MRN: [id number] Date of Birth: 10/19/1945 Referring Provider (PT): [name]    Encounter Date: 02/22/2018  PT End of Session - 02/22/18 1436    Visit Number  13    Number of Visits  17    Date for PT Re-Evaluation  04/10/18    Authorization Type  3/10 eval date 12/28/17    PT Start Time  0230    PT Stop Time  0310    PT Time Calculation (min)  40 min    Equipment Utilized During Treatment  Gait belt    Activity Tolerance  Patient tolerated treatment well    Behavior During Therapy  [facility] for tasks assessed/performed       Past Medical History:  Diagnosis Date  . Anxiety   . Depression   . Diverticulitis   . Hyperlipidemia   . Hypertension   . Right sided sciatica     Past Surgical History:  Procedure Laterality Date  . NO PAST SURGERIES      There were no vitals filed for this visit.  Subjective Assessment - 02/22/18 1435    Subjective  Patient without complaint at start of session.    Pertinent History  Patient lives with her daughter and her husband. She does not use an AD and has had 2 recent falls.     Limitations  Standing;Walking    Patient Stated Goals  Patient wants to have better balance .     Currently in Pain?  No/denies    Pain Score  0-No pain    Pain Onset  In the past 7 days         NEUROMUSCULAR RE-EDUCATION   Airex NBOS head turns x 20  each;cues for posture correction   Airex feet apart and tapping yellow disk x 20 cues for posture correction   Airex cone  reaching crossing midline cues for looking up    Toe tapping 6 inch stool without UE assist x 20 , cues for technique   Side stepping  gait in // bars x 4 laps , posture correction cues   Side stepping on blue  foam balance beam x 5 lengths of the parallel bars  with posture correction cues   4 square fwd/bwd, side to side stepping/ diagonal stepping, cues to not step on the lines  Rocker board  and trunk rotation with yellow thera ball , fwd/bwd, and lateral shifts  with 50% UE support x 2 mins  Tandem standing on 1/2 foam with flat side up and cone stacking on stool with need of min assist x 20 cones x 2 sets    TM side stepping with elevation 2 and . 2 miles / hour , 75 % UE support  Leg press 45 lbs x 15 x 2 , heel raises with 30 lbs x 20 x 2 BUE   Cues for proper technique of exercises, slow eccentric contractions to target specific muscles   CGA and Min to mod verbal cues used throughout with increased in postural sway and LOB most seen with narrow base of support and while on uneven surfaces. Continues to have balance deficits typical with diagnosis. Patient performs intermediate level exercises without pain behaviors and needs verbal cuing for postural alignment  PT Education - 02/22/18 1435    Education Details  HEP    Person(s) Educated  Patient    Methods  Explanation    Comprehension  Verbalized understanding;Returned demonstration;Need further instruction       PT Short Term Goals - 02/13/18 1526      PT SHORT TERM GOAL #1   Title  Patient will be independent in home exercise program to improve strength/mobility for better functional independence with ADLs.    Time  4    Period  Weeks    Status  Achieved    Target Date  02/13/18      PT SHORT TERM GOAL #2   Title  Patient will increase Berg Balance score by > 6 points to demonstrate decreased fall risk during functional activities.    Baseline  47/56 berg 02/13/18    Time  4    Period  Weeks    Status  Partially Met        PT Long Term Goals - 02/13/18 1436      PT LONG TERM GOAL #1   Title   Patient will be independent with ascend/descend 12 steps using single UE in step over step pattern without LOB.    Time  8    Period   Weeks    Status  Partially Met    Target Date  02/22/18      PT LONG TERM GOAL #2   Title  Patient will increase ABC scale score >80% to demonstrate better functional mobility and better confidence with ADLs.     Baseline   65% 02/13/18    Time  8    Status  Partially Met    Target Date  02/22/18      PT LONG TERM GOAL #3   Title  Patient will increase dynamic gait index score to >19/24 as to demonstrate reduced fall risk and improved dynamic gait balance for better safety with community/home ambulation.     Baseline  02/13/18=17/20    Time  8    Period  Weeks    Status  Partially Met    Target Date  02/22/18            Plan - 02/22/18 1436    Clinical Impression Statement  Patient presents with decreased gait speed, decreased balance, and decreased BLE strength. Patient's main complaint is BLE weakness and inability to participate in desired activities. Patient wants to improve balance and ability to ambulate on inclines and outdoor surfaces safely. Patient will benefit from skilled PT in order to increase gait speed, increase BLE strength, and improve dynamic standing balance to decrease risk for falls and enable patient to participate in desired activities.    Rehab Potential  Good    PT Frequency  2x / week    PT Duration  8 weeks    PT Treatment/Interventions  Manual techniques;Balance training;Neuromuscular re-education;Patient/family education;Therapeutic exercise;Therapeutic activities;Stair training;Gait training;Canalith Repostioning    PT Next Visit Plan  review HEP, balance training    PT Home Exercise Plan  squats, heel raises, corner balance feet together /head turns, modified tandem in corner    Consulted and Agree with Plan of Care  Patient       Patient will benefit from skilled therapeutic intervention in order to improve the following deficits and impairments:  Abnormal gait, Decreased balance, Difficulty walking, Postural dysfunction, Impaired flexibility,  Decreased strength, Decreased activity tolerance, Decreased mobility, Decreased endurance  Visit Diagnosis: Muscle weakness (generalized)  Difficulty in walking, not elsewhere classified  Other abnormalities of gait and mobility     Problem List Patient Active Problem List   Diagnosis Date Noted  . Baker's cyst of knee, right 06/06/2017  . Insomnia 03/07/2017  . Tobacco abuse 12/01/2016  . Hypertension   . Hyperlipidemia   . MDD (major depressive disorder)   . Generalized anxiety disorder     Arelia Sneddon S,PT DPT 02/22/2018, 2:47 PM  [place] MAIN [facility] SERVICES [address] [place], [place], [address] Phone: [phone]   Fax:  [phone]  Name: Dashonda Bonneau MRN: [id number] Date of Birth: [birth year]

## 2018-02-27 ENCOUNTER — Ambulatory Visit: Payer: Medicare Other | Attending: Family Medicine | Admitting: Physical Therapy

## 2018-02-27 ENCOUNTER — Encounter
Admission: RE | Disposition: A | Discharge status: Home / Self Care | Payer: Self-pay | Source: Home / Self Care | Attending: Gastroenterology

## 2018-02-27 ENCOUNTER — Ambulatory Visit: Payer: Medicare Other | Admitting: Anesthesiology

## 2018-02-27 ENCOUNTER — Encounter: Payer: Self-pay | Admitting: *Deleted

## 2018-02-27 ENCOUNTER — Ambulatory Visit
Admission: RE | Admit: 2018-02-27 | Discharge: 2018-02-27 | Disposition: A | Discharge status: Home / Self Care | Payer: Medicare Other | Attending: Gastroenterology | Admitting: Gastroenterology

## 2018-02-27 DIAGNOSIS — F17210 Nicotine dependence, cigarettes, uncomplicated: Secondary | ICD-10-CM | POA: Diagnosis not present

## 2018-02-27 DIAGNOSIS — K52838 Other microscopic colitis: Secondary | ICD-10-CM | POA: Diagnosis not present

## 2018-02-27 DIAGNOSIS — D122 Benign neoplasm of ascending colon: Secondary | ICD-10-CM | POA: Insufficient documentation

## 2018-02-27 DIAGNOSIS — Z7982 Long term (current) use of aspirin: Secondary | ICD-10-CM | POA: Insufficient documentation

## 2018-02-27 DIAGNOSIS — R2689 Other abnormalities of gait and mobility: Secondary | ICD-10-CM | POA: Insufficient documentation

## 2018-02-27 DIAGNOSIS — F329 Major depressive disorder, single episode, unspecified: Secondary | ICD-10-CM | POA: Diagnosis not present

## 2018-02-27 DIAGNOSIS — Z1211 Encounter for screening for malignant neoplasm of colon: Secondary | ICD-10-CM | POA: Diagnosis not present

## 2018-02-27 DIAGNOSIS — Z79899 Other long term (current) drug therapy: Secondary | ICD-10-CM | POA: Insufficient documentation

## 2018-02-27 DIAGNOSIS — Z8249 Family history of ischemic heart disease and other diseases of the circulatory system: Secondary | ICD-10-CM | POA: Insufficient documentation

## 2018-02-27 DIAGNOSIS — K635 Polyp of colon: Secondary | ICD-10-CM | POA: Diagnosis not present

## 2018-02-27 DIAGNOSIS — F419 Anxiety disorder, unspecified: Secondary | ICD-10-CM | POA: Diagnosis not present

## 2018-02-27 DIAGNOSIS — K5730 Diverticulosis of large intestine without perforation or abscess without bleeding: Secondary | ICD-10-CM | POA: Insufficient documentation

## 2018-02-27 DIAGNOSIS — G709 Myoneural disorder, unspecified: Secondary | ICD-10-CM | POA: Insufficient documentation

## 2018-02-27 DIAGNOSIS — K5790 Diverticulosis of intestine, part unspecified, without perforation or abscess without bleeding: Secondary | ICD-10-CM | POA: Diagnosis not present

## 2018-02-27 DIAGNOSIS — I10 Essential (primary) hypertension: Secondary | ICD-10-CM | POA: Insufficient documentation

## 2018-02-27 DIAGNOSIS — R262 Difficulty in walking, not elsewhere classified: Secondary | ICD-10-CM | POA: Insufficient documentation

## 2018-02-27 DIAGNOSIS — F17200 Nicotine dependence, unspecified, uncomplicated: Secondary | ICD-10-CM | POA: Diagnosis not present

## 2018-02-27 DIAGNOSIS — M6281 Muscle weakness (generalized): Secondary | ICD-10-CM | POA: Insufficient documentation

## 2018-02-27 DIAGNOSIS — E785 Hyperlipidemia, unspecified: Secondary | ICD-10-CM | POA: Insufficient documentation

## 2018-02-27 DIAGNOSIS — K529 Noninfective gastroenteritis and colitis, unspecified: Secondary | ICD-10-CM | POA: Diagnosis not present

## 2018-02-27 DIAGNOSIS — D123 Benign neoplasm of transverse colon: Secondary | ICD-10-CM | POA: Diagnosis not present

## 2018-02-27 DIAGNOSIS — M5431 Sciatica, right side: Secondary | ICD-10-CM | POA: Insufficient documentation

## 2018-02-27 HISTORY — PX: COLONOSCOPY WITH PROPOFOL: SHX5780

## 2018-02-27 SURGERY — COLONOSCOPY WITH PROPOFOL
Anesthesia: General

## 2018-02-27 MED ORDER — PROPOFOL 10 MG/ML IV BOLUS
INTRAVENOUS | Status: DC | PRN
  Administered 2018-02-27: 70 mg via INTRAVENOUS
  Administered 2018-02-27: 15 mg via INTRAVENOUS

## 2018-02-27 MED ORDER — PROPOFOL 500 MG/50ML IV EMUL
INTRAVENOUS | Status: DC | PRN
  Administered 2018-02-27: 130 ug/kg/min via INTRAVENOUS

## 2018-02-27 MED ORDER — SODIUM CHLORIDE 0.9 % IV SOLN
INTRAVENOUS | Status: DC
  Administered 2018-02-27: 1000 mL via INTRAVENOUS
  Administered 2018-02-27: 10:00:00 via INTRAVENOUS

## 2018-02-27 MED ORDER — LIDOCAINE HCL (PF) 2 % IJ SOLN
INTRAMUSCULAR | Status: AC
  Filled 2018-02-27: qty 10

## 2018-02-27 MED ORDER — PROPOFOL 500 MG/50ML IV EMUL
INTRAVENOUS | Status: AC
  Filled 2018-02-27: qty 50

## 2018-02-27 MED ORDER — LIDOCAINE HCL (CARDIAC) PF 100 MG/5ML IV SOSY
PREFILLED_SYRINGE | INTRAVENOUS | Status: DC | PRN
  Administered 2018-02-27: 50 mg via INTRAVENOUS

## 2018-02-27 NOTE — Transfer of Care (Signed)
Immediate Anesthesia Transfer of Care Note  Patient: Amanda Tucker  Procedure(s) Performed: COLONOSCOPY WITH PROPOFOL (N/A )  Patient Location: PACU and Endoscopy Unit  Anesthesia Type:General  Level of Consciousness: drowsy  Airway & Oxygen Therapy: Patient Spontanous Breathing  Post-op Assessment: Report given to RN and Post -op Vital signs reviewed and stable  Post vital signs: Reviewed and stable  Last Vitals:  Vitals Value Taken Time  BP    Temp    Pulse 64 02/27/2018 10:09 AM  Resp 16 02/27/2018 10:09 AM  SpO2 97 % 02/27/2018 10:09 AM  Vitals shown include unvalidated device data.  Last Pain:  Vitals:   02/27/18 0755  TempSrc: Tympanic  PainSc: 0-No pain         Complications: No apparent anesthesia complications

## 2018-02-27 NOTE — Anesthesia Preprocedure Evaluation (Signed)
Anesthesia Evaluation  Patient identified by MRN, date of birth, ID band Patient awake    Reviewed: Allergy & Precautions, H&P , NPO status , Patient's Chart, lab work & pertinent test results, reviewed documented beta blocker date and time   Airway Mallampati: II   Neck ROM: full    Dental  (+) Poor Dentition   Pulmonary neg pulmonary ROS, Current Smoker,    Pulmonary exam normal        Cardiovascular Exercise Tolerance: Good hypertension, On Medications negative cardio ROS Normal cardiovascular exam Rhythm:regular Rate:Normal     Neuro/Psych PSYCHIATRIC DISORDERS Anxiety Depression  Neuromuscular disease    GI/Hepatic negative GI ROS, Neg liver ROS,   Endo/Other  negative endocrine ROS  Renal/GU negative Renal ROS  negative genitourinary   Musculoskeletal   Abdominal   Peds  Hematology negative hematology ROS (+)   Anesthesia Other Findings Past Medical History: No date: Anxiety No date: Depression No date: Diverticulitis No date: Hyperlipidemia No date: Hypertension No date: Right sided sciatica Past Surgical History: No date: NO PAST SURGERIES BMI    Body Mass Index:  20.40 kg/m     Reproductive/Obstetrics negative OB ROS                             Anesthesia Physical Anesthesia Plan  ASA: II  Anesthesia Plan: General   Post-op Pain Management:    Induction:   PONV Risk Score and Plan:   Airway Management Planned:   Additional Equipment:   Intra-op Plan:   Post-operative Plan:   Informed Consent: I have reviewed the patients History and Physical, chart, labs and discussed the procedure including the risks, benefits and alternatives for the proposed anesthesia with the patient or authorized representative who has indicated his/her understanding and acceptance.     Dental Advisory Given  Plan Discussed with: CRNA  Anesthesia Plan Comments:          Anesthesia Quick Evaluation

## 2018-02-27 NOTE — Anesthesia Post-op Follow-up Note (Signed)
Anesthesia QCDR form completed.        

## 2018-02-27 NOTE — Op Note (Signed)
[facility] Gastroenterology Patient Name: Amanda Tucker Procedure Date: 02/27/2018 9:35 AM MRN: [id number] Account #: [id number] Date of Birth: Jan 03, 1946 Admit Type: Outpatient Age: 73 Room: [facility] ENDO ROOM 4 Gender: Female Note Status: Finalized Procedure:            Colonoscopy Indications:          Screening for colorectal malignant neoplasm Providers:            Jonathon Bellows MD, MD Referring MD:         [name]. Bacigalupo (Referring MD) Medicines:            Monitored Anesthesia Care Complications:        No immediate complications. Procedure:            Pre-Anesthesia Assessment:                       - Prior to the procedure, a History and Physical was                        performed, and patient medications, allergies and                        sensitivities were reviewed. The patient's tolerance of                        previous anesthesia was reviewed.                       - The risks and benefits of the procedure and the                        sedation options and risks were discussed with the                        patient. All questions were answered and informed                        consent was obtained.                       - ASA Grade Assessment: III - A patient with severe                        systemic disease.                       After obtaining informed consent, the colonoscope was                        passed under direct vision. Throughout the procedure,                        the patient's blood pressure, pulse, and oxygen                        saturations were monitored continuously. The                        Colonoscope was introduced through the anus and  advanced to the the cecum, identified by the                        appendiceal orifice, IC valve and transillumination.                        The colonoscopy was performed with ease. The patient                        tolerated the procedure well. The  quality of the bowel                        preparation was good. Findings:      The perianal and digital rectal examinations were normal.      A 3 mm polyp was found in the ascending colon. The polyp was sessile.       The polyp was removed with a cold biopsy forceps. Resection and       retrieval were complete.      Multiple small-mouthed diverticula were found in the sigmoid colon.      Normal mucosa was found in the entire colon. Biopsies for histology were       taken with a cold forceps for evaluation of microscopic colitis.      The exam was otherwise without abnormality on direct and retroflexion       views. Impression:           - One 3 mm polyp in the ascending colon, removed with a                        cold biopsy forceps. Resected and retrieved.                       - Diverticulosis in the sigmoid colon.                       - Normal mucosa in the entire examined colon. Biopsied.                       - The examination was otherwise normal on direct and                        retroflexion views. Recommendation:       - Discharge patient to home (with escort).                       - Resume previous diet.                       - Continue present medications.                       - Await pathology results.                       - Repeat colonoscopy in 5-10 years for surveillance                        based on pathology results. Procedure Code(s):    --- Professional ---  45380, Colonoscopy, flexible; with biopsy, single or                        multiple Diagnosis Code(s):    --- Professional ---                       Z12.11, Encounter for screening for malignant neoplasm                        of colon                       D12.2, Benign neoplasm of ascending colon                       K57.30, Diverticulosis of large intestine without                        perforation or abscess without bleeding CPT copyright 2018 American Medical Association.  All rights reserved. The codes documented in this report are preliminary and upon coder review may  be revised to meet current compliance requirements. Jonathon Bellows, MD Jonathon Bellows MD, MD 02/27/2018 10:08:48 AM This report has been signed electronically. Number of Addenda: 0 Note Initiated On: 02/27/2018 9:35 AM Scope Withdrawal Time: 0 hours 11 minutes 40 seconds  Total Procedure Duration: 0 hours 15 minutes 6 seconds       [facility]

## 2018-02-27 NOTE — H&P (Signed)
Jonathon Bellows, MD [address], [place], [place], [place], [address] [address], [place], [place], [place], [address] Phone: [phone]  Fax: [phone]  Primary Care Physician:  Virginia Crews, MD   Pre-Procedure History & Physical: HPI:  Amanda Tucker is a 74 y.o. female is here for an colonoscopy.   Past Medical History:  Diagnosis Date  . Anxiety   . Depression   . Diverticulitis   . Hyperlipidemia   . Hypertension   . Right sided sciatica     Past Surgical History:  Procedure Laterality Date  . NO PAST SURGERIES      Prior to Admission medications   Medication Sig Start Date End Date Taking? Authorizing Provider  ALPRAZolam Duanne Moron) 0.5 MG tablet Take 0.5 tablets (0.25 mg total) by mouth 2 (two) times daily as needed for anxiety. 12/07/17  Yes Bacigalupo, [name], MD  aspirin EC 81 MG tablet Take 81 mg daily by mouth.   Yes [provider]  lisinopril (PRINIVIL,ZESTRIL) 20 MG tablet Take 1 tablet (20 mg total) by mouth daily. 09/14/17  Yes Bacigalupo, [name], MD  sertraline (ZOLOFT) 100 MG tablet Take 1 tablet (100 mg total) by mouth daily. 12/07/17  Yes Bacigalupo, [name], MD  simvastatin (ZOCOR) 20 MG tablet Take 1 tablet (20 mg total) by mouth daily at 6 PM. 06/06/17  Yes Bacigalupo, [name], MD  aspirin-acetaminophen-caffeine (EXCEDRIN MIGRAINE) [phone] MG tablet Take 2 tablets by mouth as needed for headache.    [provider]    Allergies as of 02/13/2018  . (No Active Allergies)    Family History  Problem Relation Age of Onset  . Dementia Mother 52  . Hypertension Father   . Hyperlipidemia Father   . Heart attack Father 67  . Pancreatic cancer Brother   . CVA Maternal Grandfather   . Hypothyroidism Daughter        [place]  . Breast cancer Neg Hx   . Colon cancer Neg Hx     Social History   Socioeconomic History  . Marital status: Divorced    Spouse name: Not on file  . Number of children: 2  . Years  of education: Not on file  . Highest education level: Some college, no degree  Occupational History  . Occupation: retired  Scientific laboratory technician  . Financial resource strain: Not hard at all  . Food insecurity:    Worry: Never true    Inability: Never true  . Transportation needs:    Medical: No    Non-medical: No  Tobacco Use  . Smoking status: Current Every Day Smoker    Packs/day: 0.25    Years: 54.00    Pack years: 13.50    Types: Cigarettes  . Smokeless tobacco: Never Used  . Tobacco comment: started smoking at age 38; smoking about 3 cigarettes throughout the day  Substance and Sexual Activity  . Alcohol use: Yes    Alcohol/week: 5.0 standard drinks    Types: 5 Glasses of wine per week  . Drug use: Never  . Sexual activity: Never  Lifestyle  . Physical activity:    Days per week: 0 days    Minutes per session: 0 min  . Stress: Very much  Relationships  . Social connections:    Talks on phone: Patient refused    Gets together: Patient refused    Attends religious service: Patient refused    Active member of club or organization: Patient refused  Attends meetings of clubs or organizations: Patient refused    Relationship status: Patient refused  . Intimate partner violence:    Fear of current or ex partner: Patient refused    Emotionally abused: Patient refused    Physically abused: Patient refused    Forced sexual activity: Patient refused  Other Topics Concern  . Not on file  Social History Narrative  . Not on file    Review of Systems: See HPI, otherwise negative ROS  Physical Exam: BP (!) 155/101   Pulse 79   Temp 98.2 F (36.8 C) (Tympanic)   Resp 16   Ht 4\' 8"  (1.422 m)   Wt 41.3 kg   SpO2 98%   BMI 20.40 kg/m  General:   Alert,  pleasant and cooperative in NAD Head:  Normocephalic and atraumatic. Neck:  Supple; no masses or thyromegaly. Lungs:  Clear throughout to auscultation, normal respiratory effort.    Heart:  +S1, +S2, Regular rate and  rhythm, No edema. Abdomen:  Soft, nontender and nondistended. Normal bowel sounds, without guarding, and without rebound.   Neurologic:  Alert and  oriented x4;  grossly normal neurologically.  Impression/Plan: Amanda Tucker is here for an colonoscopy to be performed for Screening colonoscopy average risk   Risks, benefits, limitations, and alternatives regarding  colonoscopy have been reviewed with the patient.  Questions have been answered.  All parties agreeable.   Jonathon Bellows, MD  02/27/2018, 9:35 AM

## 2018-02-28 LAB — SURGICAL PATHOLOGY

## 2018-03-01 ENCOUNTER — Encounter: Payer: Self-pay | Admitting: Gastroenterology

## 2018-03-01 ENCOUNTER — Other Ambulatory Visit: Payer: Medicare Other

## 2018-03-01 ENCOUNTER — Ambulatory Visit: Payer: Medicare Other | Admitting: Physical Therapy

## 2018-03-01 DIAGNOSIS — M6281 Muscle weakness (generalized): Secondary | ICD-10-CM | POA: Diagnosis not present

## 2018-03-01 DIAGNOSIS — R2689 Other abnormalities of gait and mobility: Secondary | ICD-10-CM | POA: Diagnosis not present

## 2018-03-01 DIAGNOSIS — R262 Difficulty in walking, not elsewhere classified: Secondary | ICD-10-CM

## 2018-03-01 NOTE — Therapy (Signed)
[place] MAIN [facility] [address] [place], [place], [address] Phone: [phone]   Fax:  [phone]  Physical Therapy Treatment  Patient Details  Name: Amanda Tucker MRN: [id number] Date of Birth: April 19, 1945 Referring Provider (PT): [name]    Encounter Date: 03/01/2018  PT End of Session - 03/01/18 1434    Visit Number  14    Number of Visits  17    Date for PT Re-Evaluation  04/10/18    Authorization Type  4/10 eval date 12/28/17    Equipment Utilized During Treatment  Gait belt    Activity Tolerance  Patient tolerated treatment well    Behavior During Therapy  [facility] for tasks assessed/performed       Past Medical History:  Diagnosis Date  . Anxiety   . Depression   . Diverticulitis   . Hyperlipidemia   . Hypertension   . Right sided sciatica     Past Surgical History:  Procedure Laterality Date  . COLONOSCOPY WITH PROPOFOL N/A 02/27/2018   Procedure: COLONOSCOPY WITH PROPOFOL;  Surgeon: Jonathon Bellows, MD;  Location: [facility] ENDOSCOPY;  Service: Gastroenterology;  Laterality: N/A;  . NO PAST SURGERIES      There were no vitals filed for this visit.  Subjective Assessment - 03/01/18 1438    Subjective  Patient is doing well today.    Pertinent History  Patient lives with her daughter and her husband. She does not use an AD and has had 2 recent falls.     Limitations  Standing;Walking    Patient Stated Goals  Patient wants to have better balance .     Currently in Pain?  No/denies    Pain Score  0-No pain    Pain Onset  In the past 7 days         TREATMENT    Ther-ex  Quantum leg press 60# x 20, 75# x 20 Quantum heel raises 45# 2 x 20; Step-ups to 6' step without UE support alternating LE x 10 each; Sit to stand without UE support x 10, after first couple reps added horizontal head turns during standing;  Neuromuscular Re-education  Toe taps to 6" step without UE support alternating LE x 10 each  Toe  taps to 6" step without UE support alternating LE x 10 each  Gait in hallway using single point cane with instruction about sequencing, pt cued to perform horizontal and vertical head turns as well as gait speed changes 75' x 2; 1/2 foam roll (round side up) static balance x 30s; 1/2 foam roll (round side up) static tandem balance alternating forward LE x 30s each; 1/2 foam roll (round side up) dynamic reaching for targets on mirror with therapist calling out laterality and target;  Pt educated throughout session about proper posture and technique with exercises. Improved exercise technique, movement at target joints, use of target muscles after min to mod verbal, visual, tactile cues.   Pt requires direction and verbal cues for correct performance of standing dynamic balance exercises and strengthening exercises.                       PT Education - 03/01/18 1434    Education Details  HEP    Person(s) Educated  Patient    Methods  Explanation    Comprehension  Verbalized understanding;Returned demonstration;Need further instruction       PT Short Term Goals - 02/13/18 1526  PT SHORT TERM GOAL #1   Title  Patient will be independent in home exercise program to improve strength/mobility for better functional independence with ADLs.    Time  4    Period  Weeks    Status  Achieved    Target Date  02/13/18      PT SHORT TERM GOAL #2   Title  Patient will increase Berg Balance score by > 6 points to demonstrate decreased fall risk during functional activities.    Baseline  47/56 berg 02/13/18    Time  4    Period  Weeks    Status  Partially Met        PT Long Term Goals - 02/13/18 1436      PT LONG TERM GOAL #1   Title   Patient will be independent with ascend/descend 12 steps using single UE in step over step pattern without LOB.    Time  8    Period  Weeks    Status  Partially Met    Target Date  02/22/18      PT LONG TERM GOAL #2   Title  Patient  will increase ABC scale score >80% to demonstrate better functional mobility and better confidence with ADLs.     Baseline   65% 02/13/18    Time  8    Status  Partially Met    Target Date  02/22/18      PT LONG TERM GOAL #3   Title  Patient will increase dynamic gait index score to >19/24 as to demonstrate reduced fall risk and improved dynamic gait balance for better safety with community/home ambulation.     Baseline  02/13/18=17/20    Time  8    Period  Weeks    Status  Partially Met    Target Date  02/22/18            Plan - 03/01/18 1435    Clinical Impression Statement  Patient demonstrates understanding of HEP with minimal corrections needed. Patient challenged with changes in gait speed and head movements with multiple repetitions due to fatigue. Weak LE combined with weak core musculature results in poor postural control and back pain. Patient will continue to benefit from skilled physical therapy to improve pain and mobility    Rehab Potential  Good    PT Frequency  2x / week    PT Duration  8 weeks    PT Treatment/Interventions  Manual techniques;Balance training;Neuromuscular re-education;Patient/family education;Therapeutic exercise;Therapeutic activities;Stair training;Gait training;Canalith Repostioning    PT Next Visit Plan  review HEP, balance training    PT Home Exercise Plan  squats, heel raises, corner balance feet together /head turns, modified tandem in corner    Consulted and Agree with Plan of Care  Patient       Patient will benefit from skilled therapeutic intervention in order to improve the following deficits and impairments:  Abnormal gait, Decreased balance, Difficulty walking, Postural dysfunction, Impaired flexibility, Decreased strength, Decreased activity tolerance, Decreased mobility, Decreased endurance  Visit Diagnosis: Muscle weakness (generalized)  Difficulty in walking, not elsewhere classified  Other abnormalities of gait and  mobility     Problem List Patient Active Problem List   Diagnosis Date Noted  . Baker's cyst of knee, right 06/06/2017  . Insomnia 03/07/2017  . Tobacco abuse 12/01/2016  . Hypertension   . Hyperlipidemia   . MDD (major depressive disorder)   . Generalized anxiety disorder     [place], Sherryl Barters,  PT DPT 03/01/2018, 2:38 PM  [place] MAIN [facility] [address] [place], [place], [address] Phone: [phone]   Fax:  [phone]  Name: Amanda Tucker MRN: [id number] Date of Birth: May 23, 1945

## 2018-03-02 ENCOUNTER — Other Ambulatory Visit: Payer: Self-pay | Admitting: Family Medicine

## 2018-03-03 NOTE — Anesthesia Postprocedure Evaluation (Signed)
Anesthesia Post Note  Patient: Amanda Tucker  Procedure(s) Performed: COLONOSCOPY WITH PROPOFOL (N/A )  Patient location during evaluation: PACU Anesthesia Type: General Level of consciousness: awake and alert Pain management: pain level controlled Vital Signs Assessment: post-procedure vital signs reviewed and stable Respiratory status: spontaneous breathing, nonlabored ventilation, respiratory function stable and patient connected to nasal cannula oxygen Cardiovascular status: blood pressure returned to baseline and stable Postop Assessment: no apparent nausea or vomiting Anesthetic complications: no     Last Vitals:  Vitals:   02/27/18 1029 02/27/18 1039  BP: 140/89 (!) 136/96  Pulse: 64 65  Resp: 17 18  Temp:    SpO2: 97% 100%    Last Pain:  Vitals:   02/27/18 1039  TempSrc:   PainSc: 0-No pain                 Molli Barrows

## 2018-03-06 DIAGNOSIS — E785 Hyperlipidemia, unspecified: Secondary | ICD-10-CM | POA: Diagnosis not present

## 2018-03-06 DIAGNOSIS — I10 Essential (primary) hypertension: Secondary | ICD-10-CM | POA: Diagnosis not present

## 2018-03-07 ENCOUNTER — Ambulatory Visit
Admission: RE | Admit: 2018-03-07 | Discharge: 2018-03-07 | Disposition: A | Discharge status: Home / Self Care | Payer: Medicare Other | Source: Ambulatory Visit | Attending: Family Medicine | Admitting: Family Medicine

## 2018-03-07 DIAGNOSIS — E2839 Other primary ovarian failure: Secondary | ICD-10-CM | POA: Diagnosis not present

## 2018-03-07 DIAGNOSIS — Z780 Asymptomatic menopausal state: Secondary | ICD-10-CM | POA: Diagnosis not present

## 2018-03-07 DIAGNOSIS — M810 Age-related osteoporosis without current pathological fracture: Secondary | ICD-10-CM | POA: Diagnosis not present

## 2018-03-07 LAB — COMPREHENSIVE METABOLIC PANEL
ALT: 17 IU/L (ref 0–32)
AST: 20 IU/L (ref 0–40)
Albumin/Globulin Ratio: 2 (ref 1.2–2.2)
Albumin: 3.9 g/dL (ref 3.7–4.7)
Alkaline Phosphatase: 92 IU/L (ref 39–117)
BUN/Creatinine Ratio: 12 (ref 12–28)
BUN: 7 mg/dL — ABNORMAL LOW (ref 8–27)
Bilirubin Total: 0.4 mg/dL (ref 0.0–1.2)
CALCIUM: 8.7 mg/dL (ref 8.7–10.3)
CO2: 24 mmol/L (ref 20–29)
CREATININE: 0.58 mg/dL (ref 0.57–1.00)
Chloride: 103 mmol/L (ref 96–106)
GFR calc Af Amer: 106 mL/min/{1.73_m2} (ref >59)
GFR, EST NON AFRICAN AMERICAN: 92 mL/min/{1.73_m2} (ref >59)
Globulin, Total: 2 g/dL (ref 1.5–4.5)
Glucose: 82 mg/dL (ref 65–99)
Potassium: 3.4 mmol/L — ABNORMAL LOW (ref 3.5–5.2)
Sodium: 143 mmol/L (ref 134–144)
Total Protein: 5.9 g/dL — ABNORMAL LOW (ref 6.0–8.5)

## 2018-03-07 LAB — LIPID PANEL
CHOL/HDL RATIO: 3.2 ratio (ref 0.0–4.4)
Cholesterol, Total: 186 mg/dL (ref 100–199)
HDL: 58 mg/dL (ref >39)
LDL CALC: 101 mg/dL — AB (ref 0–99)
Triglycerides: 133 mg/dL (ref 0–149)
VLDL CHOLESTEROL CAL: 27 mg/dL (ref 5–40)

## 2018-03-08 ENCOUNTER — Ambulatory Visit: Payer: Medicare Other

## 2018-03-08 DIAGNOSIS — M6281 Muscle weakness (generalized): Secondary | ICD-10-CM

## 2018-03-08 DIAGNOSIS — R262 Difficulty in walking, not elsewhere classified: Secondary | ICD-10-CM

## 2018-03-08 DIAGNOSIS — R2689 Other abnormalities of gait and mobility: Secondary | ICD-10-CM | POA: Diagnosis not present

## 2018-03-08 NOTE — Progress Notes (Signed)
Patient: Amanda Tucker Female    DOB: 1945/07/14   73 y.o.   MRN: [id number] Visit Date: 03/09/2018  Today's Provider: [name], MD   Chief Complaint  Patient presents with  . Hypertension  . Anxiety  . Depression   Subjective:    I, Tiburcio Pea, CMA, am acting as a Education administrator for [name], MD.   HPI   HTN: - Medications: Lisinopril 20 mg daily - Compliance: Good - Checking BP at home: No - Denies any SOB, CP, vision changes, LE edema, medication SEs, or symptoms of hypotension - Diet: Low-sodium - Exercise: None regularly  Anxiety & Depression:  Patient presents today for a 3 month follow up. Last OV was on 12/07/2017. Patient advised to stop Paxil and start Zoloft 100 mg daily. She reports good compliance with treatrment plan. She states depression symptoms are stable, but anxiety symptoms are unstable at this time.  This is still improving.  She does have to use Xanax occasionally as needed  Patient has also been having intermittent greenish stools.  She states she has 2-3 bowel movements daily.  These are loose and not watery.  She denies any blood or mucus in her stool.  She takes Imodium occasionally and this does help, but she was worried about taking it more regularly.  She had recent benign colonoscopy.  Plan to repeat in 5 years.  Patient underwent DEXA scan on 03/07/2018 which showed T score in the right femur of -2.8 and T score of left forearm of -4.8.  She has never had a DEXA previously.  She has never taken a medication for osteoporosis.  She does not take calcium or vitamin D supplements.  She denies any previous bone fractures   .No Active Allergies   Current Outpatient Medications:  .  ALPRAZolam (XANAX) 0.5 MG tablet, TAKE 1/2 TABLET BY MOUTH TWICE DAILY AS NEEDED FOR ANXIETY, Disp: 30 tablet, Rfl: 3 .  aspirin EC 81 MG tablet, Take 81 mg daily by mouth., Disp: , Rfl:  .  aspirin-acetaminophen-caffeine (EXCEDRIN MIGRAINE) 250-250-65 MG  tablet, Take 2 tablets by mouth as needed for headache., Disp: , Rfl:  .  lisinopril (PRINIVIL,ZESTRIL) 20 MG tablet, Take 1 tablet (20 mg total) by mouth daily., Disp: 30 tablet, Rfl: 5 .  sertraline (ZOLOFT) 100 MG tablet, Take 1 tablet (100 mg total) by mouth daily., Disp: 30 tablet, Rfl: 3 .  simvastatin (ZOCOR) 20 MG tablet, Take 1 tablet (20 mg total) by mouth daily at 6 PM., Disp: 90 tablet, Rfl: 3  Review of Systems  Constitutional: Negative.   Respiratory: Negative.   Cardiovascular: Negative.   Gastrointestinal: Positive for diarrhea.  Musculoskeletal: Positive for gait problem.  Psychiatric/Behavioral: The patient is nervous/anxious.        Depression     Social History   Tobacco Use  . Smoking status: Current Every Day Smoker    Packs/day: 0.25    Years: 54.00    Pack years: 13.50    Types: Cigarettes  . Smokeless tobacco: Never Used  . Tobacco comment: started smoking at age 25; smoking about 3 cigarettes throughout the day  Substance Use Topics  . Alcohol use: Yes    Alcohol/week: 5.0 standard drinks    Types: 5 Glasses of wine per week      Objective:   BP (!) 142/85 (BP Location: Right Arm, Patient Position: Sitting, Cuff Size: Normal)   Pulse 64   Temp 98 F (36.7 C) (Oral)  Wt 90 lb 3.2 oz (40.9 kg)   SpO2 99%   BMI 20.22 kg/m  Vitals:   03/09/18 0933  BP: (!) 142/85  Pulse: 64  Temp: 98 F (36.7 C)  TempSrc: Oral  SpO2: 99%  Weight: 90 lb 3.2 oz (40.9 kg)     Physical Exam Vitals signs reviewed.  Constitutional:      General: She is not in acute distress.    Appearance: Normal appearance. She is not diaphoretic.  HENT:     Head: Normocephalic and atraumatic.     Right Ear: External ear normal.     Left Ear: External ear normal.     Nose: Nose normal.     Mouth/Throat:     Pharynx: Oropharynx is clear.  Eyes:     General: No scleral icterus.    Conjunctiva/sclera: Conjunctivae normal.  Neck:     Musculoskeletal: Neck supple.    Cardiovascular:     Rate and Rhythm: Normal rate and regular rhythm.     Pulses: Normal pulses.     Heart sounds: Normal heart sounds. No murmur.  Pulmonary:     Effort: Pulmonary effort is normal. No respiratory distress.     Breath sounds: Normal breath sounds. No wheezing or rhonchi.  Abdominal:     General: There is no distension.     Palpations: Abdomen is soft.     Tenderness: There is no abdominal tenderness.  Musculoskeletal:     Right lower leg: No edema.     Left lower leg: No edema.  Lymphadenopathy:     Cervical: No cervical adenopathy.  Skin:    General: Skin is warm and dry.     Capillary Refill: Capillary refill takes less than 2 seconds.     Findings: No rash.  Neurological:     Mental Status: She is alert and oriented to person, place, and time. Mental status is at baseline.  Psychiatric:        Mood and Affect: Mood normal.        Behavior: Behavior normal.        Thought Content: Thought content normal.         Assessment & Plan   Problem List Items Addressed This Visit      Cardiovascular and Mediastinum   Hypertension - Primary    Uncontrolled She is not checking home blood pressures She has previously had some whitecoat hypertension, but anxiety is better controlled now Increase lisinopril to 40 mg daily Reviewed recent metabolic panel      Relevant Medications   lisinopril (PRINIVIL,ZESTRIL) 40 MG tablet     Digestive   IBS (irritable bowel syndrome)    New problem, but perceives last colonoscopy which was benign Can use Imodium as needed        Musculoskeletal and Integument   Age-related osteoporosis without current pathological fracture    New diagnosis With significantly decreased T score Discussed her high risk of fracture Discussed calcium and vitamin D supplementation Discussed importance of regular exercise and avoiding smoking Start Fosamax weekly At follow-up, we will ensure she is tolerating this well      Relevant  Medications   alendronate (FOSAMAX) 70 MG tablet     Other   Hyperlipidemia    Reviewed recent lipid panel with patient We will continue simvastatin      Relevant Medications   lisinopril (PRINIVIL,ZESTRIL) 40 MG tablet   MDD (major depressive disorder)    Well-controlled Continue Zoloft 100 mg daily  Generalized anxiety disorder    Fairly well controlled She knows the risks of ongoing benzo use at her age Discussed option to increase Zoloft, but she wishes to hold off at this time Continue Zoloft 100 mg daily Can continue Xanax as needed      Tobacco abuse    Congratulated patient on smoking cessation Encouraged her to avoid starting active smoking      Hypokalemia    Noted on recent labs Likely related to decreased appetite in the setting of diarrhea Advised her to on eating a regular diet Recheck in 2 to 3 weeks      Relevant Orders   Basic Metabolic Panel (BMET)       Return in about 2 months (around 05/08/2018) for osteoporosis and BP f/u.   The entirety of the information documented in the History of Present Illness, Review of Systems and Physical Exam were personally obtained by me. Portions of this information were initially documented by Tiburcio Pea, CMA and reviewed by me for thoroughness and accuracy.    Virginia Crews, MD, MPH [facility] 03/09/2018 3:08 PM

## 2018-03-08 NOTE — Therapy (Signed)
[place] MAIN [facility] SERVICES [address] [place], [place], [address] Phone: [phone]   Fax:  [phone]  Physical Therapy Treatment  Patient Details  Name: Amanda Tucker MRN: [id number] Date of Birth: April 21, 1945 Referring Provider (PT): [name]    Encounter Date: 03/08/2018  PT End of Session - 03/08/18 1436    Visit Number  15    Number of Visits  17    Date for PT Re-Evaluation  04/10/18    Authorization Type  5/10 eval date 12/28/17    PT Start Time  1431    PT Stop Time  1515    PT Time Calculation (min)  44 min    Equipment Utilized During Treatment  Gait belt    Activity Tolerance  Patient tolerated treatment well    Behavior During Therapy  [facility] for tasks assessed/performed       Past Medical History:  Diagnosis Date  . Anxiety   . Depression   . Diverticulitis   . Hyperlipidemia   . Hypertension   . Right sided sciatica     Past Surgical History:  Procedure Laterality Date  . COLONOSCOPY WITH PROPOFOL N/A 02/27/2018   Procedure: COLONOSCOPY WITH PROPOFOL;  Surgeon: Jonathon Bellows, MD;  Location: [facility] ENDOSCOPY;  Service: Gastroenterology;  Laterality: N/A;  . NO PAST SURGERIES      There were no vitals filed for this visit.  Subjective Assessment - 03/08/18 1435    Subjective  Patient reports she went to [facility] to help her grandson move. Has been doing well since her last session.     Pertinent History  Patient lives with her daughter and her husband. She does not use an AD and has had 2 recent falls.     Limitations  Standing;Walking    Patient Stated Goals  Patient wants to have better balance .     Currently in Pain?  No/denies           TREATMENT   Ther-ex Quantum leg press 60# x 15x, 2 sets RLE single leg quantum leg press 10x #30lb 2 sets, tactile cueing for correct alignment  Seated: hamstring stretch 60 seconds on 6" step.   Neuromuscular Re-education Toe taps to 6" step without  UE support alternating LE x10each  Side Toe taps to 6" step without UE support alternating LE x10each  Modified single limb stance: opp LE on yellow dynadisc 60 seconds each LE  Gait in hallway with instruction about heel strike and hip flexion to reduce scuffing pt cued to perform horizontal 86 ft x 4 trials  1/2 foam roll (round side up) static balance x 30s; 1/2 foam roll (round side up) static tandem balance alternating forward LE x 30s each; 1/2 foam roll (round side up) dynamic reaching for targets on mirror; tic tac toe.   Pt educated throughout session about proper posture and technique with exercises. Improved exercise technique, movement at target joints, use of target muscles after min to mod verbal, visual, tactile cues.  Pt requires direction and verbal cues for correct performance of standing dynamic balance exercises and strengthening exercises    Right leg fatigues quicker than left LE's                   PT Education - 03/08/18 1435    Education Details  exercise technique, body mechanics    Person(s) Educated  Patient    Methods  Explanation;Demonstration;Tactile cues;Verbal cues    Comprehension  Verbalized  understanding;Returned demonstration;Tactile cues required;Need further instruction;Verbal cues required       PT Short Term Goals - 02/13/18 1526      PT SHORT TERM GOAL #1   Title  Patient will be independent in home exercise program to improve strength/mobility for better functional independence with ADLs.    Time  4    Period  Weeks    Status  Achieved    Target Date  02/13/18      PT SHORT TERM GOAL #2   Title  Patient will increase Berg Balance score by > 6 points to demonstrate decreased fall risk during functional activities.    Baseline  47/56 berg 02/13/18    Time  4    Period  Weeks    Status  Partially Met        PT Long Term Goals - 02/13/18 1436      PT LONG TERM GOAL #1   Title   Patient will be independent  with ascend/descend 12 steps using single UE in step over step pattern without LOB.    Time  8    Period  Weeks    Status  Partially Met    Target Date  02/22/18      PT LONG TERM GOAL #2   Title  Patient will increase ABC scale score >80% to demonstrate better functional mobility and better confidence with ADLs.     Baseline   65% 02/13/18    Time  8    Status  Partially Met    Target Date  02/22/18      PT LONG TERM GOAL #3   Title  Patient will increase dynamic gait index score to >19/24 as to demonstrate reduced fall risk and improved dynamic gait balance for better safety with community/home ambulation.     Baseline  02/13/18=17/20    Time  8    Period  Weeks    Status  Partially Met    Target Date  02/22/18            Plan - 03/08/18 1514    Clinical Impression Statement  Patient presents to clinic with good motivation throughout session. Right lower extremity fatigues quicker than left requiring more frequent rest breaks. Patient is limited in single limb stance with increasing forward trunk flexion resulting in trunk instability rather than lack of ankle response. Patient will continue to benefit from skilled PT to improve saftey and gait.     Rehab Potential  Good    PT Frequency  2x / week    PT Duration  8 weeks    PT Treatment/Interventions  Manual techniques;Balance training;Neuromuscular re-education;Patient/family education;Therapeutic exercise;Therapeutic activities;Stair training;Gait training;Canalith Repostioning    PT Next Visit Plan  review HEP, balance training    PT Home Exercise Plan  squats, heel raises, corner balance feet together /head turns, modified tandem in corner    Consulted and Agree with Plan of Care  Patient       Patient will benefit from skilled therapeutic intervention in order to improve the following deficits and impairments:  Abnormal gait, Decreased balance, Difficulty walking, Postural dysfunction, Impaired flexibility, Decreased  strength, Decreased activity tolerance, Decreased mobility, Decreased endurance  Visit Diagnosis: Muscle weakness (generalized)  Difficulty in walking, not elsewhere classified  Other abnormalities of gait and mobility     Problem List Patient Active Problem List   Diagnosis Date Noted  . Baker's cyst of knee, right 06/06/2017  . Insomnia 03/07/2017  . Tobacco  abuse 12/01/2016  . Hypertension   . Hyperlipidemia   . MDD (major depressive disorder)   . Generalized anxiety disorder    Janna Arch, PT, DPT   03/08/2018, 3:15 PM  [place] MAIN [facility] SERVICES [address] [place], [place], [address] Phone: [phone]   Fax:  [phone]  Name: Amneet Cendejas MRN: [id number] Date of Birth: May 11, 1945

## 2018-03-09 ENCOUNTER — Ambulatory Visit (INDEPENDENT_AMBULATORY_CARE_PROVIDER_SITE_OTHER): Payer: Medicare Other | Admitting: Family Medicine

## 2018-03-09 ENCOUNTER — Encounter: Payer: Self-pay | Admitting: Family Medicine

## 2018-03-09 VITALS — BP 142/85 | HR 64 | Temp 98.0°F | Wt 90.2 lb

## 2018-03-09 DIAGNOSIS — K580 Irritable bowel syndrome with diarrhea: Secondary | ICD-10-CM

## 2018-03-09 DIAGNOSIS — K589 Irritable bowel syndrome without diarrhea: Secondary | ICD-10-CM | POA: Insufficient documentation

## 2018-03-09 DIAGNOSIS — F411 Generalized anxiety disorder: Secondary | ICD-10-CM

## 2018-03-09 DIAGNOSIS — Z720 Tobacco use: Secondary | ICD-10-CM | POA: Diagnosis not present

## 2018-03-09 DIAGNOSIS — I10 Essential (primary) hypertension: Secondary | ICD-10-CM

## 2018-03-09 DIAGNOSIS — E876 Hypokalemia: Secondary | ICD-10-CM

## 2018-03-09 DIAGNOSIS — M810 Age-related osteoporosis without current pathological fracture: Secondary | ICD-10-CM | POA: Diagnosis not present

## 2018-03-09 DIAGNOSIS — F331 Major depressive disorder, recurrent, moderate: Secondary | ICD-10-CM | POA: Diagnosis not present

## 2018-03-09 DIAGNOSIS — E782 Mixed hyperlipidemia: Secondary | ICD-10-CM

## 2018-03-09 MED ORDER — ALENDRONATE SODIUM 70 MG PO TABS: 70.0000 mg | ORAL_TABLET | ORAL | 11 refills | Status: DC

## 2018-03-09 MED ORDER — LISINOPRIL 40 MG PO TABS: 40.0000 mg | ORAL_TABLET | Freq: Every day | ORAL | 3 refills | Status: DC

## 2018-03-09 NOTE — Assessment & Plan Note (Signed)
New problem, but perceives last colonoscopy which was benign Can use Imodium as needed

## 2018-03-09 NOTE — Assessment & Plan Note (Signed)
Fairly well controlled She knows the risks of ongoing benzo use at her age Discussed option to increase Zoloft, but she wishes to hold off at this time Continue Zoloft 100 mg daily Can continue Xanax as needed

## 2018-03-09 NOTE — Assessment & Plan Note (Signed)
Noted on recent labs Likely related to decreased appetite in the setting of diarrhea Advised her to on eating a regular diet Recheck in 2 to 3 weeks

## 2018-03-09 NOTE — Assessment & Plan Note (Signed)
Reviewed recent lipid panel with patient We will continue simvastatin

## 2018-03-09 NOTE — Patient Instructions (Addendum)
Increase lisinopril to 30m daily - ok to take 2 of the 281mpills together  Continue Zoloft at current dose  Start to eat a regular diet Come back to get potassium rechecked in 2-3 weeks  Ok to take immodium as needed  Start medicine for osteoporosis (bone density loss) Fosamax once weekly - sit upright for at least 1 hour after taking, drink with full glass of water Also start calcium and Vit D supplement   Osteoporosis  Osteoporosis is thinning and loss of density in your bones. Osteoporosis makes bones more brittle and fragile and more likely to break (fracture). Over time, osteoporosis can cause your bones to become so weak that they fracture after a minor fall. Bones in the hip, wrist, and spine are most likely to fracture due to osteoporosis. What are the causes? The exact cause of this condition is not known. What increases the risk? You may be at greater risk for osteoporosis if you:  Have a family history of the condition.  Have poor nutrition.  Use steroid medicines, such as prednisone.  Are female.  Are age 2431r older.  Smoke or have a history of smoking.  Are not physically active (are sedentary).  Are white (Caucasian) or of Asian descent.  Have a small body frame.  Take certain medicines, such as antiseizure medicines. What are the signs or symptoms? A fracture might be the first sign of osteoporosis, especially if the fracture results from a fall or injury that usually would not cause a bone to break. Other signs and symptoms include:  Pain in the neck or low back.  Stooped posture.  Loss of height. How is this diagnosed? This condition may be diagnosed based on:  Your medical history.  A physical exam.  A bone mineral density test, also called a DXA or DEXA test (dual-energy X-ray absorptiometry test). This test uses X-rays to measure the amount of minerals in your bones. How is this treated? The goal of treatment is to strengthen your bones  and lower your risk for a fracture. Treatment may involve:  Making lifestyle changes, such as: ? Including foods with more calcium and vitamin D in your diet. ? Doing weight-bearing and muscle-strengthening exercises. ? Stopping tobacco use. ? Limiting alcohol intake.  Taking medicine to slow the process of bone loss or to increase bone density.  Taking daily supplements of calcium and vitamin D.  Taking hormone replacement medicines, such as estrogen for women and testosterone for men.  Monitoring your levels of calcium and vitamin D. Follow these instructions at home:  Activity  Exercise as told by your health care provider. Ask your health care provider what exercises and activities are safe for you. You should do: ? Exercises that make you work against gravity (weight-bearing exercises), such as tai chi, yoga, or walking. ? Exercises to strengthen muscles, such as lifting weights. Lifestyle  Limit alcohol intake to no more than 1 drink a day for nonpregnant women and 2 drinks a day for men. One drink equals 12 oz of beer, 5 oz of wine, or 1 oz of hard liquor.  Do not use any products that contain nicotine or tobacco, such as cigarettes and e-cigarettes. If you need help quitting, ask your health care provider. Preventing falls  Use devices to help you move around (mobility aids) as needed, such as canes, walkers, scooters, or crutches.  Keep rooms well-lit and clutter-free.  Remove tripping hazards from walkways, including cords and throw rugs.  Install  grab bars in bathrooms and safety rails on stairs.  Use rubber mats in the bathroom and other areas that are often wet or slippery.  Wear closed-toe shoes that fit well and support your feet. Wear shoes that have rubber soles or low heels.  Review your medicines with your health care provider. Some medicines can cause dizziness or changes in blood pressure, which can increase your risk of falling. General  instructions  Include calcium and vitamin D in your diet. Calcium is important for bone health, and vitamin D helps your body to absorb calcium. Good sources of calcium and vitamin D include: ? Certain fatty fish, such as salmon and tuna. ? Products that have calcium and vitamin D added to them (fortified products), such as fortified cereals. ? Egg yolks. ? Cheese. ? Liver.  Take over-the-counter and prescription medicines only as told by your health care provider.  Keep all follow-up visits as told by your health care provider. This is important. Contact a health care provider if:  You have never been screened for osteoporosis and you are: ? A woman who is age 27 or older. ? A man who is age 54 or older. Get help right away if:  You fall or injure yourself. Summary  Osteoporosis is thinning and loss of density in your bones. This makes bones more brittle and fragile and more likely to break (fracture),even with minor falls.  The goal of treatment is to strengthen your bones and reduce your risk for a fracture.  Include calcium and vitamin D in your diet. Calcium is important for bone health, and vitamin D helps your body to absorb calcium.  Talk with your health care provider about screening for osteoporosis if you are a woman who is age 6 or older, or a man who is age 27 or older. This information is not intended to replace advice given to you by your health care provider. Make sure you discuss any questions you have with your health care provider. Document Released: 10/21/2004 Document Revised: 11/05/2016 Document Reviewed: 11/05/2016 Elsevier Interactive Patient Education  2019 [employer].

## 2018-03-09 NOTE — Assessment & Plan Note (Signed)
Well-controlled Continue Zoloft 100 mg daily

## 2018-03-09 NOTE — Assessment & Plan Note (Signed)
New diagnosis With significantly decreased T score Discussed her high risk of fracture Discussed calcium and vitamin D supplementation Discussed importance of regular exercise and avoiding smoking Start Fosamax weekly At follow-up, we will ensure she is tolerating this well

## 2018-03-09 NOTE — Assessment & Plan Note (Signed)
Uncontrolled She is not checking home blood pressures She has previously had some whitecoat hypertension, but anxiety is better controlled now Increase lisinopril to 40 mg daily Reviewed recent metabolic panel

## 2018-03-09 NOTE — Assessment & Plan Note (Signed)
Congratulated patient on smoking cessation Encouraged her to avoid starting active smoking

## 2018-03-10 ENCOUNTER — Other Ambulatory Visit: Payer: Self-pay | Admitting: Family Medicine

## 2018-03-14 ENCOUNTER — Ambulatory Visit: Payer: Medicare Other | Admitting: Physical Therapy

## 2018-03-14 ENCOUNTER — Encounter: Payer: Self-pay | Admitting: Physical Therapy

## 2018-03-14 DIAGNOSIS — M6281 Muscle weakness (generalized): Secondary | ICD-10-CM | POA: Diagnosis not present

## 2018-03-14 DIAGNOSIS — R2689 Other abnormalities of gait and mobility: Secondary | ICD-10-CM

## 2018-03-14 DIAGNOSIS — R262 Difficulty in walking, not elsewhere classified: Secondary | ICD-10-CM | POA: Diagnosis not present

## 2018-03-14 NOTE — Therapy (Signed)
[place] MAIN [facility] SERVICES [address] [place], [place], [address] Phone: [phone]   Fax:  [phone]  Physical Therapy Treatment  Patient Details  Name: Amanda Tucker MRN: [id number] Date of Birth: December 13, 1945 Referring Provider (PT): [name]    Encounter Date: 03/14/2018  PT End of Session - 03/14/18 1440    Visit Number  16    Number of Visits  17    Date for PT Re-Evaluation  04/10/18    Authorization Type  6/10 eval date 12/28/17    PT Start Time  0230    PT Stop Time  0315    PT Time Calculation (min)  45 min    Equipment Utilized During Treatment  Gait belt    Activity Tolerance  Patient tolerated treatment well    Behavior During Therapy  [facility] for tasks assessed/performed       Past Medical History:  Diagnosis Date  . Anxiety   . Depression   . Diverticulitis   . Hyperlipidemia   . Hypertension   . Right sided sciatica     Past Surgical History:  Procedure Laterality Date  . COLONOSCOPY WITH PROPOFOL N/A 02/27/2018   Procedure: COLONOSCOPY WITH PROPOFOL;  Surgeon: Jonathon Bellows, MD;  Location: [facility] ENDOSCOPY;  Service: Gastroenterology;  Laterality: N/A;  . NO PAST SURGERIES      There were no vitals filed for this visit.  Subjective Assessment - 03/14/18 1434    Subjective  Patient found out that she has osteoporosis.     Pertinent History  Patient lives with her daughter and her husband. She does not use an AD and has had 2 recent falls.     Limitations  Standing;Walking    Patient Stated Goals  Patient wants to have better balance .     Currently in Pain?  No/denies    Pain Score  0-No pain    Pain Onset  In the past 7 days       Treatmentl Warm up no Nu-step x 5 mins  Side stepping in parallel bars left and right x 5 x 2 wit fingertip assist  Backward walking in parallel bars with finger tip assist x 5 feet x 2 laps   Cross over pattern in side stepping in parallel bars with finger tip assist  x 5 feet x 2 laps  Ther-ex  Standing hip abd with 2# ankle weight x 15 BLE and  BUE support   Standing hip extension with 2# ankle weight x 10 BLE with BUE support   Standing hip flexion marches with 2# ankle weight x 10 BLE with BUE support   Standing HS curls with 2# ankle weight x 10 BLE with BUE support   Standing heel raises x 15 with 2# ankle weights   LAQ with 2# ankle weights with 3 second holds x10 each LE   STS from chair without UE support 2x10  Mini squats x10 with no UE support   Leg press 45 lbs x 20 x 2   CGA and Min verbal cues used throughout with increased in postural sway and LOB most seen with narrow base of support and while on uneven surfaces. Continues to have balance deficits typical with diagnosis. Patient performs intermediate level exercises without pain behaviors and needs verbal cuing for postural alignment and head positioning                     PT Education - 03/14/18 1435  Education Details  HEP    Person(s) Educated  Patient    Methods  Explanation    Comprehension  Verbalized understanding;Returned demonstration;Need further instruction       PT Short Term Goals - 02/13/18 1526      PT SHORT TERM GOAL #1   Title  Patient will be independent in home exercise program to improve strength/mobility for better functional independence with ADLs.    Time  4    Period  Weeks    Status  Achieved    Target Date  02/13/18      PT SHORT TERM GOAL #2   Title  Patient will increase Berg Balance score by > 6 points to demonstrate decreased fall risk during functional activities.    Baseline  47/56 berg 02/13/18    Time  4    Period  Weeks    Status  Partially Met        PT Long Term Goals - 02/13/18 1436      PT LONG TERM GOAL #1   Title   Patient will be independent with ascend/descend 12 steps using single UE in step over step pattern without LOB.    Time  8    Period  Weeks    Status  Partially Met    Target Date   02/22/18      PT LONG TERM GOAL #2   Title  Patient will increase ABC scale score >80% to demonstrate better functional mobility and better confidence with ADLs.     Baseline   65% 02/13/18    Time  8    Status  Partially Met    Target Date  02/22/18      PT LONG TERM GOAL #3   Title  Patient will increase dynamic gait index score to >19/24 as to demonstrate reduced fall risk and improved dynamic gait balance for better safety with community/home ambulation.     Baseline  02/13/18=17/20    Time  8    Period  Weeks    Status  Partially Met    Target Date  02/22/18            Plan - 03/14/18 1449    Clinical Impression Statement  Patient with good tolerance to session, only occasional seated rest breaks needed. Pt most challenged by slowed ambulation to focus on balance in stance phase and step length, CGA-minAx1 to maintain balance. Pt also CGA-minAx1 with narrow base of support and uneven surfaces this session with mild improvement in posture/exercise technique with cueing. The patient would benefit from further skilled PT to continue to progress towards goals.    Rehab Potential  Good    PT Frequency  2x / week    PT Duration  8 weeks    PT Treatment/Interventions  Manual techniques;Balance training;Neuromuscular re-education;Patient/family education;Therapeutic exercise;Therapeutic activities;Stair training;Gait training;Canalith Repostioning    PT Next Visit Plan  review HEP, balance training    PT Home Exercise Plan  squats, heel raises, corner balance feet together /head turns, modified tandem in corner    Consulted and Agree with Plan of Care  Patient       Patient will benefit from skilled therapeutic intervention in order to improve the following deficits and impairments:  Abnormal gait, Decreased balance, Difficulty walking, Postural dysfunction, Impaired flexibility, Decreased strength, Decreased activity tolerance, Decreased mobility, Decreased endurance  Visit  Diagnosis: Muscle weakness (generalized)  Other abnormalities of gait and mobility  Difficulty in walking, not elsewhere classified  Problem List Patient Active Problem List   Diagnosis Date Noted  . IBS (irritable bowel syndrome) 03/09/2018  . Hypokalemia 03/09/2018  . Age-related osteoporosis without current pathological fracture 03/09/2018  . Baker's cyst of knee, right 06/06/2017  . Insomnia 03/07/2017  . Tobacco abuse 12/01/2016  . Hypertension   . Hyperlipidemia   . MDD (major depressive disorder)   . Generalized anxiety disorder     Alanson Puls, [place] DPT 03/14/2018, 2:50 PM  [place] MAIN [facility] SERVICES [address] [place], [place], [address] Phone: [phone]   Fax:  [phone]  Name: Amanda Tucker MRN: [id number] Date of Birth: Oct 30, 1945

## 2018-03-16 ENCOUNTER — Other Ambulatory Visit: Payer: Self-pay | Admitting: Family Medicine

## 2018-03-16 ENCOUNTER — Ambulatory Visit: Payer: Medicare Other | Admitting: Physical Therapy

## 2018-03-16 ENCOUNTER — Encounter: Payer: Self-pay | Admitting: Physical Therapy

## 2018-03-16 DIAGNOSIS — R2689 Other abnormalities of gait and mobility: Secondary | ICD-10-CM

## 2018-03-16 DIAGNOSIS — R262 Difficulty in walking, not elsewhere classified: Secondary | ICD-10-CM

## 2018-03-16 DIAGNOSIS — M6281 Muscle weakness (generalized): Secondary | ICD-10-CM

## 2018-03-16 NOTE — Therapy (Signed)
[place] MAIN [facility] SERVICES [address] [place], [place], [address] Phone: [phone]   Fax:  [phone]  Physical Therapy Treatment  Patient Details  Name: Amanda Tucker MRN: [id number] Date of Birth: [birth year] Referring Provider (PT): [name]    Encounter Date: 03/16/2018  PT End of Session - 03/16/18 1124    Visit Number  17    Number of Visits  17    Date for PT Re-Evaluation  04/10/18    Authorization Type  7/10 eval date 12/28/17    PT Start Time  1107    PT Stop Time  1150    PT Time Calculation (min)  43 min    Equipment Utilized During Treatment  Gait belt    Activity Tolerance  Patient tolerated treatment well    Behavior During Therapy  [facility] for tasks assessed/performed       Past Medical History:  Diagnosis Date  . Anxiety   . Depression   . Diverticulitis   . Hyperlipidemia   . Hypertension   . Right sided sciatica     Past Surgical History:  Procedure Laterality Date  . COLONOSCOPY WITH PROPOFOL N/A 02/27/2018   Procedure: COLONOSCOPY WITH PROPOFOL;  Surgeon: Jonathon Bellows, MD;  Location: [facility] ENDOSCOPY;  Service: Gastroenterology;  Laterality: N/A;  . NO PAST SURGERIES      There were no vitals filed for this visit.  Subjective Assessment - 03/16/18 1122    Subjective  Patient found out that she has osteoporosis.     Pertinent History  Patient lives with her daughter and her husband. She does not use an AD and has had 2 recent falls.     Limitations  Standing;Walking    Patient Stated Goals  Patient wants to have better balance .     Currently in Pain?  Yes    Pain Score  5     Pain Location  Leg    Pain Orientation  Right    Pain Descriptors / Indicators  Aching    Pain Onset  In the past 7 days    Aggravating Factors   stretching    Pain Relieving Factors  rest    Effect of Pain on Daily Activities  unable to walk    Multiple Pain Sites  Yes       TREATMENT   Ther-ex Quantum leg  press 60# x 20, 75# x 20 Quantum heel raises 45# 2 x 20; Step-ups to 6' step without UE support alternating LE x 10 each; Sit to stand without UE support x 10, with horizontal head turns  Sit to stand with one foot ahead of the other slightly x 10   Neuromuscular Re-education Toe taps to 6" step without UE support alternating LE x10each  Toe taps to 6" step without UE support alternating LE x10each  Gait in hallway  pt cued to perform horizontal and vertical head turns as well as gait speed changes 75' x 2; Stepping over bolster fwd/bwd, side to side x 10   Pt educated throughout session about proper posture and technique with exercises. Improved exercise technique, movement at target joints, use of target muscles after min to mod verbal, visual, tactile cues. Pt requires direction and verbal cues for correct performance of standing dynamic balance exercises and strengthening exercises                         PT Education - 03/16/18 1124  Education Details  HEP    Person(s) Educated  Patient    Comprehension  Verbalized understanding;Returned demonstration;Need further instruction       PT Short Term Goals - 02/13/18 1526      PT SHORT TERM GOAL #1   Title  Patient will be independent in home exercise program to improve strength/mobility for better functional independence with ADLs.    Time  4    Period  Weeks    Status  Achieved    Target Date  02/13/18      PT SHORT TERM GOAL #2   Title  Patient will increase Berg Balance score by > 6 points to demonstrate decreased fall risk during functional activities.    Baseline  47/56 berg 02/13/18    Time  4    Period  Weeks    Status  Partially Met        PT Long Term Goals - 02/13/18 1436      PT LONG TERM GOAL #1   Title   Patient will be independent with ascend/descend 12 steps using single UE in step over step pattern without LOB.    Time  8    Period  Weeks    Status  Partially Met     Target Date  02/22/18      PT LONG TERM GOAL #2   Title  Patient will increase ABC scale score >80% to demonstrate better functional mobility and better confidence with ADLs.     Baseline   65% 02/13/18    Time  8    Status  Partially Met    Target Date  02/22/18      PT LONG TERM GOAL #3   Title  Patient will increase dynamic gait index score to >19/24 as to demonstrate reduced fall risk and improved dynamic gait balance for better safety with community/home ambulation.     Baseline  02/13/18=17/20    Time  8    Period  Weeks    Status  Partially Met    Target Date  02/22/18            Plan - 03/16/18 1154    Clinical Impression Statement  Pt requires direction and verbal cues for correct performance of exercises. Patient demonstrates weakness in BLE and performs open and closed chain exercises with minimal reports of pain to right knee.  Pt was able to perform all exercises with mod assist and VC for technique. Patient struggles with balance with unstable surfaces.  Pt encouraged continuing new supine HEP .Follow-up as scheduled.    Rehab Potential  Good    PT Frequency  2x / week    PT Duration  8 weeks    PT Treatment/Interventions  Manual techniques;Balance training;Neuromuscular re-education;Patient/family education;Therapeutic exercise;Therapeutic activities;Stair training;Gait training;Canalith Repostioning    PT Next Visit Plan  review HEP, balance training    PT Home Exercise Plan  squats, heel raises, corner balance feet together /head turns, modified tandem in corner    Consulted and Agree with Plan of Care  Patient       Patient will benefit from skilled therapeutic intervention in order to improve the following deficits and impairments:  Abnormal gait, Decreased balance, Difficulty walking, Postural dysfunction, Impaired flexibility, Decreased strength, Decreased activity tolerance, Decreased mobility, Decreased endurance  Visit Diagnosis: Muscle weakness  (generalized)  Other abnormalities of gait and mobility  Difficulty in walking, not elsewhere classified     Problem List Patient Active Problem List  Diagnosis Date Noted  . IBS (irritable bowel syndrome) 03/09/2018  . Hypokalemia 03/09/2018  . Age-related osteoporosis without current pathological fracture 03/09/2018  . Baker's cyst of knee, right 06/06/2017  . Insomnia 03/07/2017  . Tobacco abuse 12/01/2016  . Hypertension   . Hyperlipidemia   . MDD (major depressive disorder)   . Generalized anxiety disorder     Alanson Puls, [place] DPT 03/16/2018, 11:56 AM  [place] MAIN [facility] SERVICES [address] [place], [place], [address] Phone: [phone]   Fax:  [phone]  Name: Amanda Tucker MRN: [id number] Date of Birth: 1945-04-16

## 2018-03-20 DIAGNOSIS — H2513 Age-related nuclear cataract, bilateral: Secondary | ICD-10-CM | POA: Diagnosis not present

## 2018-03-20 DIAGNOSIS — H40003 Preglaucoma, unspecified, bilateral: Secondary | ICD-10-CM | POA: Diagnosis not present

## 2018-03-21 ENCOUNTER — Ambulatory Visit: Payer: Medicare Other

## 2018-03-21 DIAGNOSIS — R2689 Other abnormalities of gait and mobility: Secondary | ICD-10-CM

## 2018-03-21 DIAGNOSIS — R262 Difficulty in walking, not elsewhere classified: Secondary | ICD-10-CM | POA: Diagnosis not present

## 2018-03-21 DIAGNOSIS — M6281 Muscle weakness (generalized): Secondary | ICD-10-CM

## 2018-03-21 NOTE — Therapy (Signed)
[place] MAIN [facility] SERVICES [address] [place], [place], [address] Phone: [phone]   Fax:  [phone]  Physical Therapy Treatment  Patient Details  Name: Amanda Tucker MRN: [id number] Date of Birth: 01-Aug-1945 Referring Provider (PT): [name]    Encounter Date: 03/21/2018  PT End of Session - 03/21/18 1538    Visit Number  18    Number of Visits  17    Date for PT Re-Evaluation  04/10/18    Authorization Type  8/10 eval date 12/28/17    PT Start Time  1524    PT Stop Time  1604    PT Time Calculation (min)  40 min    Activity Tolerance  Patient tolerated treatment well    Behavior During Therapy  [facility] for tasks assessed/performed       Past Medical History:  Diagnosis Date  . Anxiety   . Depression   . Diverticulitis   . Hyperlipidemia   . Hypertension   . Right sided sciatica     Past Surgical History:  Procedure Laterality Date  . COLONOSCOPY WITH PROPOFOL N/A 02/27/2018   Procedure: COLONOSCOPY WITH PROPOFOL;  Surgeon: Jonathon Bellows, MD;  Location: [facility] ENDOSCOPY;  Service: Gastroenterology;  Laterality: N/A;  . NO PAST SURGERIES      There were no vitals filed for this visit.  Subjective Assessment - 03/21/18 1536    Subjective  Pt doing well in general. She is working on HEP and has no questions. She reports no recent falls or 'close calls.'     Pertinent History  Patient lives with her daughter and her husband. She does not use an AD and has had 2 recent falls.     Currently in Pain?  No/denies       TREATMENT  Ther-ex  -AMB warm-up 1x456f wth 180 degree turns, supervision level; 2-3 episodes of lateral sway -Quantum leg press: seat hole 3: 75# 2x15 (pt reports heavier than prior session, suspect that seat position is different -Quantum heel raises 45# 1x20 -Sit to stand without UE support x 10, with horizontal head turns @ supervision level (remains forward flexed)   -Lateral step-ups 6" step 2x10  bilat (min-modA for 10+ LOB, worse with onset fatigue)  Neuromuscular Reeducation -1833fAMB c 8lb dumbbells in each hand (16lb total) 3 bouts -Toe taps to 6" step without UE support alternating LE x 10 each  -Gait in hallway pt cued to perform horizontal and vertical head turns as well as gait speed changes 75' x 2; -AMB in hallway, VC for head turns for scanning for playing cards, calling out face and suit; 2x7551fVC to maintain gait speed: difficulty due to visual limitations (pt not wearing glasses), also seems to have some cognitive challenge   PT Short Term Goals - 02/13/18 1526      PT SHORT TERM GOAL #1   Title  Patient will be independent in home exercise program to improve strength/mobility for better functional independence with ADLs.    Time  4    Period  Weeks    Status  Achieved    Target Date  02/13/18      PT SHORT TERM GOAL #2   Title  Patient will increase Berg Balance score by > 6 points to demonstrate decreased fall risk during functional activities.    Baseline  47/56 berg 02/13/18    Time  4    Period  Weeks    Status  Partially Met  PT Long Term Goals - 02/13/18 1436      PT LONG TERM GOAL #1   Title   Patient will be independent with ascend/descend 12 steps using single UE in step over step pattern without LOB.    Time  8    Period  Weeks    Status  Partially Met    Target Date  02/22/18      PT LONG TERM GOAL #2   Title  Patient will increase ABC scale score >80% to demonstrate better functional mobility and better confidence with ADLs.     Baseline   65% 02/13/18    Time  8    Status  Partially Met    Target Date  02/22/18      PT LONG TERM GOAL #3   Title  Patient will increase dynamic gait index score to >19/24 as to demonstrate reduced fall risk and improved dynamic gait balance for better safety with community/home ambulation.     Baseline  02/13/18=17/20    Time  8    Period  Weeks    Status  Partially Met    Target Date  02/22/18             Plan - 03/21/18 1548    Clinical Impression Statement  Pt able to participate in entire session as planned, but does require additional rest breaks. Added in some AMB with free weights to combine balance training with a funcitonal task and osteoporosis friendly intervention. Pt really struggle smost with lateral step ups, especially in the onset of fatigue. Pt requires min-modA for LOB recovery during many activities.     Rehab Potential  Good    PT Frequency  2x / week    PT Duration  8 weeks    PT Treatment/Interventions  Manual techniques;Balance training;Neuromuscular re-education;Patient/family education;Therapeutic exercise;Therapeutic activities;Stair training;Gait training;Canalith Repostioning    PT Next Visit Plan  review HEP, balance training    PT Home Exercise Plan  squats, heel raises, corner balance feet together /head turns, modified tandem in corner    Consulted and Agree with Plan of Care  Patient       Patient will benefit from skilled therapeutic intervention in order to improve the following deficits and impairments:  Abnormal gait, Decreased balance, Difficulty walking, Postural dysfunction, Impaired flexibility, Decreased strength, Decreased activity tolerance, Decreased mobility, Decreased endurance  Visit Diagnosis: Muscle weakness (generalized)  Other abnormalities of gait and mobility  Difficulty in walking, not elsewhere classified     Problem List Patient Active Problem List   Diagnosis Date Noted  . IBS (irritable bowel syndrome) 03/09/2018  . Hypokalemia 03/09/2018  . Age-related osteoporosis without current pathological fracture 03/09/2018  . Baker's cyst of knee, right 06/06/2017  . Insomnia 03/07/2017  . Tobacco abuse 12/01/2016  . Hypertension   . Hyperlipidemia   . MDD (major depressive disorder)   . Generalized anxiety disorder     4:09 PM, 03/21/18 Etta Grandchild, PT, DPT Physical Therapist - [facility]  Outpatient Physical Therapy- [place] [phone]     Etta Grandchild 03/21/2018, 3:56 PM  [place] MAIN [facility] SERVICES [address] [place], [place], [address] Phone: [phone]   Fax:  [phone]  Name: Jamesina Gaugh MRN: [id number] Date of Birth: [birth year]

## 2018-03-23 ENCOUNTER — Ambulatory Visit: Payer: Medicare Other | Admitting: Physical Therapy

## 2018-03-23 ENCOUNTER — Encounter: Payer: Self-pay | Admitting: Physical Therapy

## 2018-03-23 DIAGNOSIS — M6281 Muscle weakness (generalized): Secondary | ICD-10-CM | POA: Diagnosis not present

## 2018-03-23 DIAGNOSIS — R262 Difficulty in walking, not elsewhere classified: Secondary | ICD-10-CM

## 2018-03-23 DIAGNOSIS — R2689 Other abnormalities of gait and mobility: Secondary | ICD-10-CM

## 2018-03-23 NOTE — Therapy (Signed)
[place] [facility] REHAB SERVICES [address] [place], [place], [address] Phone: [phone]   Fax:  [phone]  Physical Therapy Treatment  Patient Details  Name: Amanda Tucker MRN: [id number] Date of Birth: 01/15/1946 Referring Provider (PT): Bacigalupo, Angela    Encounter Date: 03/23/2018  PT End of Session - 03/23/18 1359    Visit Number  19    Number of Visits  17    Date for PT Re-Evaluation  04/10/18    Authorization Type  9/10 eval date 12/28/17    PT Start Time  0150    PT Stop Time  0230    PT Time Calculation (min)  40 min    Activity Tolerance  Patient tolerated treatment well    Behavior During Therapy  [facility] for tasks assessed/performed       Past Medical History:  Diagnosis Date  . Anxiety   . Depression   . Diverticulitis   . Hyperlipidemia   . Hypertension   . Right sided sciatica     Past Surgical History:  Procedure Laterality Date  . COLONOSCOPY WITH PROPOFOL N/A 02/27/2018   Procedure: COLONOSCOPY WITH PROPOFOL;  Surgeon: Anna, Kiran, MD;  Location: [facility] ENDOSCOPY;  Service: Gastroenterology;  Laterality: N/A;  . NO PAST SURGERIES      There were no vitals filed for this visit.  Subjective Assessment - 03/23/18 1357    Subjective  Pt is having some right knee pain today.  She is working on HEP and has no questions. She reports no recent falls or 'close calls.'     Pertinent History  Patient lives with her daughter and her husband. She does not use an AD and has had 2 recent falls.     Limitations  Standing;Walking    Patient Stated Goals  Patient wants to have better balance .     Currently in Pain?  Yes    Pain Score  5     Pain Location  Knee    Pain Orientation  Right    Pain Descriptors / Indicators  Aching    Pain Type  Acute pain    Pain Onset  In the past 7 days    Aggravating Factors   walking    Pain Relieving Factors  asprin    Effect of Pain on Daily Activities  difficult to walk    Multiple  Pain Sites  No         Treatment: Tandem stand on foam with head turns One foot on floor, one foot on foam with UE fwd flex with 1 lb x 20  One foot on floor, one foot on foam with UE rotation with 1 lb x 20  Ascending/descending steps w/ rails vc for correct sequencing Side stepping left and right in parallel bars x 5 lengths with finger tips holding Standing lunge to BOSU ball x 10 x 2 BLE ( left hip difficult)  Standing on foam feet together with head turns x 10 x 2  Standing hip abd with RTB x 20  Standing hip ext with RTB x 20  Patient required min VCs for balance stability, including to increase trunk control for less loss of balance with smaller base of support Pt educated throughout session about proper posture and technique with exercises. Improved exercise technique, movement at target joints, use of target muscles after min to mod verbal, visual, tactile cues                         PT Education - 03/23/18 1614    Education Details  HEP    Person(s) Educated  Patient    Methods  Explanation    Comprehension  Verbalized understanding;Returned demonstration;Need further instruction       PT Short Term Goals - 02/13/18 1526      PT SHORT TERM GOAL #1   Title  Patient will be independent in home exercise program to improve strength/mobility for better functional independence with ADLs.    Time  4    Period  Weeks    Status  Achieved    Target Date  02/13/18      PT SHORT TERM GOAL #2   Title  Patient will increase Berg Balance score by > 6 points to demonstrate decreased fall risk during functional activities.    Baseline  47/56 berg 02/13/18    Time  4    Period  Weeks    Status  Partially Met        PT Long Term Goals - 02/13/18 1436      PT LONG TERM GOAL #1   Title   Patient will be independent with ascend/descend 12 steps using single UE in step over step pattern without LOB.    Time  8    Period  Weeks    Status  Partially Met     Target Date  02/22/18      PT LONG TERM GOAL #2   Title  Patient will increase ABC scale score >80% to demonstrate better functional mobility and better confidence with ADLs.     Baseline   65% 02/13/18    Time  8    Status  Partially Met    Target Date  02/22/18      PT LONG TERM GOAL #3   Title  Patient will increase dynamic gait index score to >19/24 as to demonstrate reduced fall risk and improved dynamic gait balance for better safety with community/home ambulation.     Baseline  02/13/18=17/20    Time  8    Period  Weeks    Status  Partially Met    Target Date  02/22/18            Plan - 03/23/18 1402    Clinical Impression Statement  Pt responded well to all interventions today.  Focus on LE  strengthening in order to improve balance and ambulation.  .  Pt was able to complete LE exercises, requiring cueing for proper form . Pt would continue to benefit from skilled therapy services in order to improve LE and core strength, gait and functional mobility in order to decrease risk of falls and improve independence with mobility    Rehab Potential  Good    PT Frequency  2x / week    PT Duration  8 weeks    PT Treatment/Interventions  Manual techniques;Balance training;Neuromuscular re-education;Patient/family education;Therapeutic exercise;Therapeutic activities;Stair training;Gait training;Canalith Repostioning    PT Next Visit Plan  review HEP, balance training    PT Home Exercise Plan  squats, heel raises, corner balance feet together /head turns, modified tandem in corner    Consulted and Agree with Plan of Care  Patient       Patient will benefit from skilled therapeutic intervention in order to improve the following deficits and impairments:  Abnormal gait, Decreased balance, Difficulty walking, Postural dysfunction, Impaired flexibility, Decreased strength, Decreased activity tolerance, Decreased mobility, Decreased endurance  Visit Diagnosis: Muscle weakness  (generalized)  Other abnormalities of gait   and mobility  Difficulty in walking, not elsewhere classified     Problem List Patient Active Problem List   Diagnosis Date Noted  . IBS (irritable bowel syndrome) 03/09/2018  . Hypokalemia 03/09/2018  . Age-related osteoporosis without current pathological fracture 03/09/2018  . Baker's cyst of knee, right 06/06/2017  . Insomnia 03/07/2017  . Tobacco abuse 12/01/2016  . Hypertension   . Hyperlipidemia   . MDD (major depressive disorder)   . Generalized anxiety disorder     Alanson Puls, [place], DPT 03/23/2018, 4:16 PM  [place] MAIN [facility] SERVICES [address] [place], [place], [address] Phone: [phone]   Fax:  [phone]  Name: Brenlee Koskela MRN: [id number] Date of Birth: Oct 18, 1945

## 2018-03-28 ENCOUNTER — Ambulatory Visit: Payer: Medicare Other | Attending: Family Medicine | Admitting: Physical Therapy

## 2018-03-28 DIAGNOSIS — R262 Difficulty in walking, not elsewhere classified: Secondary | ICD-10-CM | POA: Diagnosis not present

## 2018-03-28 DIAGNOSIS — M6281 Muscle weakness (generalized): Secondary | ICD-10-CM | POA: Insufficient documentation

## 2018-03-28 DIAGNOSIS — R2689 Other abnormalities of gait and mobility: Secondary | ICD-10-CM | POA: Diagnosis not present

## 2018-03-28 NOTE — Therapy (Signed)
[place] MAIN [facility] SERVICES [address] [place], [place], [address] Phone: [phone]   Fax:  [phone]  Physical Therapy Treatment/ Physical Therapy Progress Note   Dates of reporting period  02/13/18   to   03/28/18  Patient Details  Name: Amanda Tucker MRN: [id number] Date of Birth: 25-Jan-1946 Referring Provider (PT): [name]    Encounter Date: 03/28/2018  PT End of Session - 03/28/18 1404    Visit Number  20    Number of Visits  17    Date for PT Re-Evaluation  04/10/18    Authorization Type  10/10 eval date 12/28/17    PT Start Time  0145    PT Stop Time  0230    PT Time Calculation (min)  45 min    Activity Tolerance  Patient tolerated treatment well    Behavior During Therapy  [facility] for tasks assessed/performed       Past Medical History:  Diagnosis Date  . Anxiety   . Depression   . Diverticulitis   . Hyperlipidemia   . Hypertension   . Right sided sciatica     Past Surgical History:  Procedure Laterality Date  . COLONOSCOPY WITH PROPOFOL N/A 02/27/2018   Procedure: COLONOSCOPY WITH PROPOFOL;  Surgeon: Jonathon Bellows, MD;  Location: [facility] ENDOSCOPY;  Service: Gastroenterology;  Laterality: N/A;  . NO PAST SURGERIES      There were no vitals filed for this visit.  Subjective Assessment - 03/28/18 1403    Subjective  Pt is having some right knee pain today.  She is working on [employer] and has no questions. She reports no recent falls or 'close calls.'     Pertinent History  Patient lives with her daughter and her husband. She does not use an AD and has had 2 recent falls.     Limitations  Standing;Walking    Patient Stated Goals  Patient wants to have better balance .     Currently in Pain?  Yes    Pain Score  2     Pain Location  Knee    Pain Orientation  Right    Pain Descriptors / Indicators  Aching    Pain Type  Acute pain    Pain Radiating Towards  NA    Pain Onset  In the past 7 days    Aggravating Factors    waking    Pain Relieving Factors  rest    Effect of Pain on Daily Activities  NA    Multiple Pain Sites  No     Treatment: Reviewed goals and progress was made in DGI , ABC and steps goal  Standing in parallel bars: Standing on airex: toe taps to 4 inch step with 2-1 rail assist x15 reps bilaterally; standing one foot on step, Alternate BUE ball pass side/side x5 reps each foot on step; mini squat unsupported x10 reps; heel/toe raises x15 reps with B rail assist for balance; Alternate march x15 bilaterally with cues to increase hip flexion for better ROM/strength; staggered stance, head turns side/side, up/down x5 reps each, each foot in front;    Patient tolerated session well; denies any pain or fatigue at end of session                        PT Education - 03/28/18 1403    Education Details  HEP    Person(s) Educated  Patient    Methods  Demonstration  Comprehension  Verbalized understanding;Returned demonstration;Need further instruction       PT Short Term Goals - 02/13/18 1526      PT SHORT TERM GOAL #1   Title  Patient will be independent in home exercise program to improve strength/mobility for better functional independence with ADLs.    Time  4    Period  Weeks    Status  Achieved    Target Date  02/13/18      PT SHORT TERM GOAL #2   Title  Patient will increase Berg Balance score by > 6 points to demonstrate decreased fall risk during functional activities.    Baseline  47/56 berg 02/13/18    Time  4    Period  Weeks    Status  Partially Met        PT Long Term Goals - 03/28/18 1404      PT LONG TERM GOAL #1   Title   Patient will be independent with ascend/descend 12 steps using single UE in step over step pattern without LOB.    Time  8    Period  Weeks    Status  Partially Met    Target Date  04/10/18      PT LONG TERM GOAL #2   Title  Patient will increase ABC scale score >80% to demonstrate better functional mobility and  better confidence with ADLs.     Baseline   65% 02/13/18, 03/28/18= 81.5%    Time  8    Status  Partially Met    Target Date  04/10/18      PT LONG TERM GOAL #3   Title  Patient will increase dynamic gait index score to >19/24 as to demonstrate reduced fall risk and improved dynamic gait balance for better safety with community/home ambulation.     Baseline  02/13/18=17/24,, 03/27/18=18/24    Time  8    Period  Weeks    Status  Partially Met    Target Date  04/10/18            Plan - 03/28/18 1408    Clinical Impression Statement  Patient's condition has the potential to improve in response to therapy. Maximum improvement is yet to be obtained. The anticipated improvement is attainable and reasonable in a generally predictable time.  Patient reports that she still feels unbalanced but is getting better.  Pt responded well to all interventions today.  Focus on LE strengthening in order to improve balance and ambulation.  Pt was able to complete LE exercises, requiring cueing for proper form.  Pt would continue to benefit from skilled therapy services in order to improve LE and core strength, gait and functional mobility in order to decrease risk of falls and improve independence with mobility.    Rehab Potential  Good    PT Frequency  2x / week    PT Duration  8 weeks    PT Treatment/Interventions  Manual techniques;Balance training;Neuromuscular re-education;Patient/family education;Therapeutic exercise;Therapeutic activities;Stair training;Gait training;Canalith Repostioning    PT Next Visit Plan  review HEP, balance training    PT Home Exercise Plan  squats, heel raises, corner balance feet together /head turns, modified tandem in corner    Consulted and Agree with Plan of Care  Patient       Patient will benefit from skilled therapeutic intervention in order to improve the following deficits and impairments:  Abnormal gait, Decreased balance, Difficulty walking, Postural dysfunction,  Impaired flexibility, Decreased strength, Decreased activity tolerance,  Decreased mobility, Decreased endurance  Visit Diagnosis: Muscle weakness (generalized)  Other abnormalities of gait and mobility  Difficulty in walking, not elsewhere classified     Problem List Patient Active Problem List   Diagnosis Date Noted  . IBS (irritable bowel syndrome) 03/09/2018  . Hypokalemia 03/09/2018  . Age-related osteoporosis without current pathological fracture 03/09/2018  . Baker's cyst of knee, right 06/06/2017  . Insomnia 03/07/2017  . Tobacco abuse 12/01/2016  . Hypertension   . Hyperlipidemia   . MDD (major depressive disorder)   . Generalized anxiety disorder     Alanson Puls, [place] DPT 03/28/2018, 2:21 PM  [place] MAIN [facility] SERVICES [address] [place], [place], [address] Phone: [phone]   Fax:  [phone]  Name: Amanda Tucker MRN: [id number] Date of Birth: 1945-10-06

## 2018-03-28 NOTE — Addendum Note (Signed)
Addended by: Alanson Puls on: 03/28/2018 02:36 PM   Modules accepted: Orders

## 2018-03-30 ENCOUNTER — Ambulatory Visit: Payer: Medicare Other | Admitting: Physical Therapy

## 2018-04-03 ENCOUNTER — Ambulatory Visit: Payer: Medicare Other | Admitting: Physical Therapy

## 2018-04-03 DIAGNOSIS — R262 Difficulty in walking, not elsewhere classified: Secondary | ICD-10-CM

## 2018-04-03 DIAGNOSIS — R2689 Other abnormalities of gait and mobility: Secondary | ICD-10-CM | POA: Diagnosis not present

## 2018-04-03 DIAGNOSIS — M6281 Muscle weakness (generalized): Secondary | ICD-10-CM

## 2018-04-03 NOTE — Therapy (Signed)
[place] MAIN [facility] SERVICES [address] [place], [place], [address] Phone: [phone]   Fax:  [phone]  Physical Therapy Treatment  Patient Details  Name: Amanda Tucker MRN: [id number] Date of Birth: 1945-06-23 Referring Provider (PT): [name]    Encounter Date: 04/03/2018  PT End of Session - 04/03/18 1439    Visit Number  21    Number of Visits  17    Date for PT Re-Evaluation  05/26/18    Authorization Type  1/10 eval date 12/28/17    PT Start Time  0230    PT Stop Time  0315    PT Time Calculation (min)  45 min    Activity Tolerance  Patient tolerated treatment well    Behavior During Therapy  [facility] for tasks assessed/performed       Past Medical History:  Diagnosis Date  . Anxiety   . Depression   . Diverticulitis   . Hyperlipidemia   . Hypertension   . Right sided sciatica     Past Surgical History:  Procedure Laterality Date  . COLONOSCOPY WITH PROPOFOL N/A 02/27/2018   Procedure: COLONOSCOPY WITH PROPOFOL;  Surgeon: Jonathon Bellows, MD;  Location: [facility] ENDOSCOPY;  Service: Gastroenterology;  Laterality: N/A;  . NO PAST SURGERIES      There were no vitals filed for this visit.  Subjective Assessment - 04/03/18 1439    Subjective  Pt is having some right knee pain today.  She is working on [employer] and has no questions. She reports no recent falls or 'close calls.'     Pertinent History  Patient lives with her daughter and her husband. She does not use an AD and has had 2 recent falls.     Limitations  Standing;Walking    Patient Stated Goals  Patient wants to have better balance .     Currently in Pain?  No/denies    Pain Score  0-No pain    Pain Onset  In the past 7 days            NEUROMUSCULAR RE-EDUCATION   Airex feet apart and tapping yellow disk x 20 cues for posture correction   Toe tapping 6 inch stool without UE assist x 20 , cues for technique   Side stepping  gait in // bars x 4 laps , posture  correction cues   Side stepping on blue  foam balance beam x 5 lengths of the parallel bars with posture correction cues   4 square fwd/bwd, side to side stepping/ diagonal stepping, cues to not step on the lines  Rocker board  and trunk rotation with yellow thera ball , fwd/bwd, and lateral shifts  with 50% UE support x 2 mins  Tandem standing on 1/2 foam with flat side up and cone stacking on stool with need of min assist x 20 cones x 2 sets    Bosu ball lunges  , 25 % UE support   Cues for proper technique of exercises, slow eccentric contractions to target specific muscles                     PT Education - 04/03/18 1439    Education Details  HEP    Person(s) Educated  Patient    Methods  Explanation    Comprehension  Returned demonstration;Need further instruction       PT Short Term Goals - 02/13/18 1526      PT SHORT TERM GOAL #  1   Title  Patient will be independent in home exercise program to improve strength/mobility for better functional independence with ADLs.    Time  4    Period  Weeks    Status  Achieved    Target Date  02/13/18      PT SHORT TERM GOAL #2   Title  Patient will increase Berg Balance score by > 6 points to demonstrate decreased fall risk during functional activities.    Baseline  47/56 berg 02/13/18    Time  4    Period  Weeks    Status  Partially Met        PT Long Term Goals - 03/28/18 1404      PT LONG TERM GOAL #1   Title   Patient will be independent with ascend/descend 12 steps using single UE in step over step pattern without LOB.    Time  8    Period  Weeks    Status  Partially Met    Target Date  04/10/18      PT LONG TERM GOAL #2   Title  Patient will increase ABC scale score >80% to demonstrate better functional mobility and better confidence with ADLs.     Baseline   65% 02/13/18, 03/28/18= 81.5%    Time  8    Status  Partially Met    Target Date  04/10/18      PT LONG TERM GOAL #3   Title  Patient will  increase dynamic gait index score to >19/24 as to demonstrate reduced fall risk and improved dynamic gait balance for better safety with community/home ambulation.     Baseline  02/13/18=17/24,, 03/27/18=18/24    Time  8    Period  Weeks    Status  Partially Met    Target Date  04/10/18            Plan - 04/03/18 1443    Clinical Impression Statement  Patient instructed in advanced dynamic and static balance exercise. Utilized stable and uneven surfaces to further challenge dynamic balance. Patient required min Vcs for correct positioning and exercise technique . Patient had a difficult time during  narrow base of support challenges.  He exhibits better SLS ability being able to progress to foam; Patient would benefit from additional skilled PT Intervention to improve strength, balance and gait safety    Rehab Potential  Good    PT Frequency  2x / week    PT Duration  8 weeks    PT Treatment/Interventions  Manual techniques;Balance training;Neuromuscular re-education;Patient/family education;Therapeutic exercise;Therapeutic activities;Stair training;Gait training;Canalith Repostioning    PT Next Visit Plan  review HEP, balance training    PT Home Exercise Plan  squats, heel raises, corner balance feet together /head turns, modified tandem in corner    Consulted and Agree with Plan of Care  Patient       Patient will benefit from skilled therapeutic intervention in order to improve the following deficits and impairments:  Abnormal gait, Decreased balance, Difficulty walking, Postural dysfunction, Impaired flexibility, Decreased strength, Decreased activity tolerance, Decreased mobility, Decreased endurance  Visit Diagnosis: Muscle weakness (generalized)  Other abnormalities of gait and mobility  Difficulty in walking, not elsewhere classified     Problem List Patient Active Problem List   Diagnosis Date Noted  . IBS (irritable bowel syndrome) 03/09/2018  . Hypokalemia 03/09/2018   . Age-related osteoporosis without current pathological fracture 03/09/2018  . Baker's cyst of knee, right 06/06/2017  .  Insomnia 03/07/2017  . Tobacco abuse 12/01/2016  . Hypertension   . Hyperlipidemia   . MDD (major depressive disorder)   . Generalized anxiety disorder     Alanson Puls, [place] DPT 04/03/2018, 3:10 PM  [place] MAIN [facility] SERVICES [address] [place], [place], [address] Phone: [phone]   Fax:  [phone]  Name: Amanda Tucker MRN: [id number] Date of Birth: [birth year]

## 2018-04-05 ENCOUNTER — Ambulatory Visit: Payer: Medicare Other | Admitting: Physical Therapy

## 2018-04-05 ENCOUNTER — Other Ambulatory Visit: Payer: Self-pay

## 2018-04-05 DIAGNOSIS — R2689 Other abnormalities of gait and mobility: Secondary | ICD-10-CM

## 2018-04-05 DIAGNOSIS — R262 Difficulty in walking, not elsewhere classified: Secondary | ICD-10-CM

## 2018-04-05 DIAGNOSIS — M6281 Muscle weakness (generalized): Secondary | ICD-10-CM

## 2018-04-05 NOTE — Therapy (Signed)
[place] MAIN [facility] SERVICES [address] [place], [place], [address] Phone: [phone]   Fax:  [phone]  Physical Therapy Treatment  Patient Details  Name: Amanda Tucker MRN: [id number] Date of Birth: 1945/06/13 Referring Provider (PT): [name]    Encounter Date: 04/05/2018  PT End of Session - 04/05/18 1442    Visit Number  22    Number of Visits  17    Date for PT Re-Evaluation  05/26/18    Authorization Type  2/10 eval date 12/28/17    PT Start Time  0233    PT Stop Time  0315    PT Time Calculation (min)  42 min    Activity Tolerance  Patient tolerated treatment well    Behavior During Therapy  [facility] for tasks assessed/performed       Past Medical History:  Diagnosis Date  . Anxiety   . Depression   . Diverticulitis   . Hyperlipidemia   . Hypertension   . Right sided sciatica     Past Surgical History:  Procedure Laterality Date  . COLONOSCOPY WITH PROPOFOL N/A 02/27/2018   Procedure: COLONOSCOPY WITH PROPOFOL;  Surgeon: Jonathon Bellows, MD;  Location: [facility] ENDOSCOPY;  Service: Gastroenterology;  Laterality: N/A;  . NO PAST SURGERIES      There were no vitals filed for this visit.  Subjective Assessment - 04/05/18 1438    Subjective  Pt is having some right knee pain today.  She is working on [employer] and has no questions. She reports no recent falls or 'close calls.'     Pertinent History  Patient lives with her daughter and her husband. She does not use an AD and has had 2 recent falls.     Limitations  Standing;Walking    Patient Stated Goals  Patient wants to have better balance .     Currently in Pain?  No/denies    Pain Score  0-No pain    Pain Onset  In the past 7 days       Treatment  Nu-step x 5 mins L 3  Leg press x 15 x 2 with45lbs,Verbal cues to complete slowly and not let knees snap back.  Step ups to 6 inch stool, from foam  x20. No UE suport. CGA  Lunges over bosu ball x 10 BLE, mod  support   forward and backward stepping over hurdle x15 each direction  SIde stepping over hurdle 15x each direction  Matrix 22 . 5 lbs fwd/bwd x 5 , side stepping x 5 reps   Patient required min-moderate verbal/tactile cues for correct exercise technique. CGA for safety and cues to improve upper trunk control for better balance control                        PT Education - 04/05/18 1439    Education Details  HEP    Person(s) Educated  Patient    Methods  Explanation    Comprehension  Returned demonstration;Need further instruction       PT Short Term Goals - 02/13/18 1526      PT SHORT TERM GOAL #1   Title  Patient will be independent in home exercise program to improve strength/mobility for better functional independence with ADLs.    Time  4    Period  Weeks    Status  Achieved    Target Date  02/13/18      PT SHORT TERM GOAL #2  Title  Patient will increase Berg Balance score by > 6 points to demonstrate decreased fall risk during functional activities.    Baseline  47/56 berg 02/13/18    Time  4    Period  Weeks    Status  Partially Met        PT Long Term Goals - 04/05/18 1504      PT LONG TERM GOAL #1   Title   Patient will be independent with ascend/descend 12 steps using single UE in step over step pattern without LOB.    Time  8    Period  Weeks    Status  Partially Met      PT LONG TERM GOAL #2   Title  Patient will increase ABC scale score >80% to demonstrate better functional mobility and better confidence with ADLs.     Baseline   65% 02/13/18, 03/28/18= 81.5%    Time  8    Status  Partially Met      PT LONG TERM GOAL #3   Title  Patient will increase dynamic gait index score to >19/24 as to demonstrate reduced fall risk and improved dynamic gait balance for better safety with community/home ambulation.     Baseline  02/13/18=17/24,, 03/27/18=18/24    Time  8    Period  Weeks    Status  Partially Met            Plan  - 04/05/18 1444    Clinical Impression Statement  Pt requires direction and verbal cues for correct performance of exercises. Patient demonstrates weakness in BUE and performs open and closed chain exercises with no reports of pain. Patient performs intermediate standing dynamic standing balance exercises to shift weight and perform single leg standing activities with mod assist.  Pt was able to perform all exercises with min assist and VC for technique  Pt encouraged continuing HEP .Follow-up as scheduled.    Rehab Potential  Good    PT Frequency  2x / week    PT Duration  8 weeks    PT Treatment/Interventions  Manual techniques;Balance training;Neuromuscular re-education;Patient/family education;Therapeutic exercise;Therapeutic activities;Stair training;Gait training;Canalith Repostioning    PT Next Visit Plan  review HEP, balance training    PT Home Exercise Plan  squats, heel raises, corner balance feet together /head turns, modified tandem in corner    Consulted and Agree with Plan of Care  Patient       Patient will benefit from skilled therapeutic intervention in order to improve the following deficits and impairments:  Abnormal gait, Decreased balance, Difficulty walking, Postural dysfunction, Impaired flexibility, Decreased strength, Decreased activity tolerance, Decreased mobility, Decreased endurance  Visit Diagnosis: Muscle weakness (generalized)  Other abnormalities of gait and mobility  Difficulty in walking, not elsewhere classified     Problem List Patient Active Problem List   Diagnosis Date Noted  . IBS (irritable bowel syndrome) 03/09/2018  . Hypokalemia 03/09/2018  . Age-related osteoporosis without current pathological fracture 03/09/2018  . Baker's cyst of knee, right 06/06/2017  . Insomnia 03/07/2017  . Tobacco abuse 12/01/2016  . Hypertension   . Hyperlipidemia   . MDD (major depressive disorder)   . Generalized anxiety disorder     Alanson Puls, [place] DPT 04/05/2018, 3:06 PM  [place] MAIN [facility] SERVICES [address] [place], [place], [address] Phone: [phone]   Fax:  [phone]  Name: Vaishnavi Dalby MRN: [id number] Date of Birth: [birth year]

## 2018-04-09 ENCOUNTER — Other Ambulatory Visit: Payer: Self-pay | Admitting: Family Medicine

## 2018-04-10 ENCOUNTER — Ambulatory Visit: Payer: Medicare Other | Admitting: Physical Therapy

## 2018-04-10 ENCOUNTER — Encounter: Payer: Self-pay | Admitting: Physical Therapy

## 2018-04-10 ENCOUNTER — Other Ambulatory Visit: Payer: Self-pay

## 2018-04-10 DIAGNOSIS — R262 Difficulty in walking, not elsewhere classified: Secondary | ICD-10-CM

## 2018-04-10 DIAGNOSIS — M6281 Muscle weakness (generalized): Secondary | ICD-10-CM

## 2018-04-10 DIAGNOSIS — R2689 Other abnormalities of gait and mobility: Secondary | ICD-10-CM

## 2018-04-10 NOTE — Therapy (Signed)
[place] MAIN [facility] SERVICES [address] [place], [place], [address] Phone: [phone]   Fax:  [phone]  Physical Therapy Treatment  Patient Details  Name: Amanda Tucker MRN: [id number] Date of Birth: 1946-01-10 Referring Provider (PT): [name]    Encounter Date: 04/10/2018  PT End of Session - 04/10/18 1613    Visit Number  23    Number of Visits  17    Date for PT Re-Evaluation  05/26/18    Authorization Type  2/10 eval date 12/28/17    PT Start Time  1515    PT Stop Time  1600    PT Time Calculation (min)  45 min    Equipment Utilized During Treatment  Gait belt    Activity Tolerance  Patient tolerated treatment well    Behavior During Therapy  [facility] for tasks assessed/performed       Past Medical History:  Diagnosis Date  . Anxiety   . Depression   . Diverticulitis   . Hyperlipidemia   . Hypertension   . Right sided sciatica     Past Surgical History:  Procedure Laterality Date  . COLONOSCOPY WITH PROPOFOL N/A 02/27/2018   Procedure: COLONOSCOPY WITH PROPOFOL;  Surgeon: Jonathon Bellows, MD;  Location: [facility] ENDOSCOPY;  Service: Gastroenterology;  Laterality: N/A;  . NO PAST SURGERIES      There were no vitals filed for this visit.  Subjective Assessment - 04/10/18 1609    Subjective  Pt continues to c/o R knee pain (points to R fibular head).  She states she feels like it was from "sitting too long."  She reports no falls or problems since she was last here.    Pertinent History  Patient lives with her daughter and her husband. She does not use an AD and has had 2 recent falls.     Limitations  Standing;Walking    Patient Stated Goals  Patient wants to have better balance .     Currently in Pain?  Yes    Pain Score  5     Pain Location  Knee    Pain Orientation  Right    Pain Descriptors / Indicators  Aching    Pain Type  Acute pain    Pain Onset  Today    Pain Frequency  Intermittent    Aggravating Factors    standing    Pain Relieving Factors  rest    Multiple Pain Sites  No           Treatment:  Therapeutic Exercise:  Nustep L3 Warm up x 5 min (unbilled); Leg Press Machine 30# 2x10; step ups on 6" step with 1# ankle wt on each ankle 2x10 B; lateral step ups on 4" step with 1# ankle wt on each ankle 2x10 B; heel raises in standing with 1# ankle wt B 20x; standing alt marching with 1# ankle wts 2x10; hooklying clams with GTB 20x; Bridging with hip add ball squeeze 2x10.  Neuromuscular Re-Education:  Tandem stance on 1/2 foam roller round side on bottom; tandem gait on line in // bars without UE support; SLS and tandem stance on AirEx foam pad; alternating lunges onto yellow disc 2x10 B.                    PT Education - 04/10/18 1612    Education Details  HEP- instructed in kitchen counter tandem stance for balance    Person(s) Educated  Patient  Methods  Explanation;Verbal cues    Comprehension  Returned demonstration       PT Short Term Goals - 02/13/18 1526      PT SHORT TERM GOAL #1   Title  Patient will be independent in home exercise program to improve strength/mobility for better functional independence with ADLs.    Time  4    Period  Weeks    Status  Achieved    Target Date  02/13/18      PT SHORT TERM GOAL #2   Title  Patient will increase Berg Balance score by > 6 points to demonstrate decreased fall risk during functional activities.    Baseline  47/56 berg 02/13/18    Time  4    Period  Weeks    Status  Partially Met        PT Long Term Goals - 04/05/18 1504      PT LONG TERM GOAL #1   Title   Patient will be independent with ascend/descend 12 steps using single UE in step over step pattern without LOB.    Time  8    Period  Weeks    Status  Partially Met      PT LONG TERM GOAL #2   Title  Patient will increase ABC scale score >80% to demonstrate better functional mobility and better confidence with ADLs.     Baseline   65% 02/13/18,  03/28/18= 81.5%    Time  8    Status  Partially Met      PT LONG TERM GOAL #3   Title  Patient will increase dynamic gait index score to >19/24 as to demonstrate reduced fall risk and improved dynamic gait balance for better safety with community/home ambulation.     Baseline  02/13/18=17/24,, 03/27/18=18/24    Time  8    Period  Weeks    Status  Partially Met            Plan - 04/10/18 1618    Clinical Impression Statement  Pt required cueing for improved standing posture with several exercises and NMR activities, and also for performing activities in a more slow and controlled manner.  Min-mod A required for NMR activities.    Clinical Decision Making  Low    Rehab Potential  Good    PT Frequency  2x / week    PT Duration  8 weeks    PT Treatment/Interventions  Manual techniques;Balance training;Neuromuscular re-education;Patient/family education;Therapeutic exercise;Therapeutic activities;Stair training;Gait training;Canalith Repostioning    PT Next Visit Plan  Balance Training; LE strengthening    PT Home Exercise Plan  squats, heel raises, corner balance feet together /head turns, modified tandem in corner; kitchen counter tandem stance    Consulted and Agree with Plan of Care  Patient       Patient will benefit from skilled therapeutic intervention in order to improve the following deficits and impairments:  Abnormal gait, Decreased balance, Difficulty walking, Postural dysfunction, Impaired flexibility, Decreased strength, Decreased activity tolerance, Decreased mobility, Decreased endurance  Visit Diagnosis: Muscle weakness (generalized)  Other abnormalities of gait and mobility  Difficulty in walking, not elsewhere classified     Problem List Patient Active Problem List   Diagnosis Date Noted  . IBS (irritable bowel syndrome) 03/09/2018  . Hypokalemia 03/09/2018  . Age-related osteoporosis without current pathological fracture 03/09/2018  . Baker's cyst of knee,  right 06/06/2017  . Insomnia 03/07/2017  . Tobacco abuse 12/01/2016  . Hypertension   .  Hyperlipidemia   . MDD (major depressive disorder)   . Generalized anxiety disorder     Charmian Forbis, MPT 04/10/2018, 4:22 PM  [place] MAIN [facility] SERVICES [address] [place], [place], [address] Phone: [phone]   Fax:  [phone]  Name: Keltie Labell MRN: [id number] Date of Birth: August 05, 1945

## 2018-04-12 ENCOUNTER — Other Ambulatory Visit: Payer: Self-pay

## 2018-04-12 ENCOUNTER — Ambulatory Visit: Payer: Medicare Other

## 2018-04-12 DIAGNOSIS — R2689 Other abnormalities of gait and mobility: Secondary | ICD-10-CM

## 2018-04-12 DIAGNOSIS — M6281 Muscle weakness (generalized): Secondary | ICD-10-CM | POA: Diagnosis not present

## 2018-04-12 DIAGNOSIS — R262 Difficulty in walking, not elsewhere classified: Secondary | ICD-10-CM

## 2018-04-12 NOTE — Therapy (Signed)
[place] MAIN [facility] SERVICES [address] [place], [place], [address] Phone: [phone]   Fax:  [phone]  Physical Therapy Treatment  Patient Details  Name: Amanda Tucker MRN: [id number] Date of Birth: December 31, 1945 Referring Provider (PT): [name]    Encounter Date: 04/12/2018  PT End of Session - 04/12/18 1308    Visit Number  24    Number of Visits  17    Date for PT Re-Evaluation  05/26/18    Authorization Type  2/10 eval date 12/28/17    PT Start Time  1305    PT Stop Time  1345    PT Time Calculation (min)  40 min    Equipment Utilized During Treatment  Gait belt    Activity Tolerance  Patient tolerated treatment well    Behavior During Therapy  [facility] for tasks assessed/performed       Past Medical History:  Diagnosis Date  . Anxiety   . Depression   . Diverticulitis   . Hyperlipidemia   . Hypertension   . Right sided sciatica     Past Surgical History:  Procedure Laterality Date  . COLONOSCOPY WITH PROPOFOL N/A 02/27/2018   Procedure: COLONOSCOPY WITH PROPOFOL;  Surgeon: Jonathon Bellows, MD;  Location: [facility] ENDOSCOPY;  Service: Gastroenterology;  Laterality: N/A;  . NO PAST SURGERIES      There were no vitals filed for this visit.  Subjective Assessment - 04/12/18 1307    Subjective  Pt reports her balance at home is going well in general but she is avoiding grass and graded surfaced due to their difficulty.     Pertinent History  Patient lives with her daughter and her husband. She does not use an AD and has had 2 recent falls.     Currently in Pain?  No/denies       INTERVENTION THIS DATE Neuromuscular reeducation -RetroAMB c able resistance: 1x43f c 7.5lbs resistance; 1x20 c 7.5, 1x4110fc 5lb resistance, 1x6072f  -Red Mat perimeter walking (counter clockwise) with random ankle weights beneath to simulate unlevel surface: 4 sets x8 laps   PT Short Term Goals - 02/13/18 1526      PT SHORT TERM GOAL #1   Title  Patient will be independent in home exercise program to improve strength/mobility for better functional independence with ADLs.    Time  4    Period  Weeks    Status  Achieved    Target Date  02/13/18      PT SHORT TERM GOAL #2   Title  Patient will increase Berg Balance score by > 6 points to demonstrate decreased fall risk during functional activities.    Baseline  47/56 berg 02/13/18    Time  4    Period  Weeks    Status  Partially Met        PT Long Term Goals - 04/05/18 1504      PT LONG TERM GOAL #1   Title   Patient will be independent with ascend/descend 12 steps using single UE in step over step pattern without LOB.    Time  8    Period  Weeks    Status  Partially Met      PT LONG TERM GOAL #2   Title  Patient will increase ABC scale score >80% to demonstrate better functional mobility and better confidence with ADLs.     Baseline   65% 02/13/18, 03/28/18= 81.5%    Time  8  Status  Partially Met      PT LONG TERM GOAL #3   Title  Patient will increase dynamic gait index score to >19/24 as to demonstrate reduced fall risk and improved dynamic gait balance for better safety with community/home ambulation.     Baseline  02/13/18=17/24,, 03/27/18=18/24    Time  8    Period  Weeks    Status  Partially Met            Plan - 04/12/18 1313    Clinical Impression Statement  Continued with POC as previously established. Conitnued to work on dynamic balance and gait based balance. Pt endorses most difficulty at home with grass and hills which she avoids. These are simulated this date with retro AMB c PA cable resitance and unlevel surfaces on a cushion mat with objects beneath. Pt fatigues quickly with detracted quality but rest breaks provided as needed. Pt greatly challenged by session updates this date, requires modA >10x for LOB recovery.      Rehab Potential  Good    PT Frequency  2x / week    PT Duration  8 weeks    PT Treatment/Interventions  Manual  techniques;Balance training;Neuromuscular re-education;Patient/family education;Therapeutic exercise;Therapeutic activities;Stair training;Gait training;Canalith Repostioning    PT Next Visit Plan  Balance Training; LE strengthening    PT Home Exercise Plan  squats, heel raises, corner balance feet together /head turns, modified tandem in corner; kitchen counter tandem stance    Consulted and Agree with Plan of Care  Patient       Patient will benefit from skilled therapeutic intervention in order to improve the following deficits and impairments:  Abnormal gait, Decreased balance, Difficulty walking, Postural dysfunction, Impaired flexibility, Decreased strength, Decreased activity tolerance, Decreased mobility, Decreased endurance  Visit Diagnosis: Muscle weakness (generalized)  Other abnormalities of gait and mobility  Difficulty in walking, not elsewhere classified     Problem List Patient Active Problem List   Diagnosis Date Noted  . IBS (irritable bowel syndrome) 03/09/2018  . Hypokalemia 03/09/2018  . Age-related osteoporosis without current pathological fracture 03/09/2018  . Baker's cyst of knee, right 06/06/2017  . Insomnia 03/07/2017  . Tobacco abuse 12/01/2016  . Hypertension   . Hyperlipidemia   . MDD (major depressive disorder)   . Generalized anxiety disorder    1:42 PM, 04/12/18 Etta Grandchild, PT, DPT Physical Therapist - [facility]  Outpatient Physical [place] [phone]    Etta Grandchild 04/12/2018, 1:41 PM  [place] MAIN [facility] SERVICES [address] [place], [place], [address] Phone: [phone]   Fax:  [phone]  Name: Amanda Tucker MRN: [id number] Date of Birth: Sep 29, 1945

## 2018-04-17 ENCOUNTER — Ambulatory Visit: Payer: Medicare Other | Admitting: Physical Therapy

## 2018-04-18 ENCOUNTER — Encounter: Payer: Self-pay | Admitting: Physical Therapy

## 2018-04-18 NOTE — Therapy (Signed)
[place] MAIN [facility] SERVICES [address] [place], [place], [address] Phone: [phone]   Fax:  [phone]  Patient Details  Name: Amanda Tucker MRN: [id number] Date of Birth: November 12, 1945 Referring Provider:  No ref. provider found  Encounter Date: 04/18/2018 Patient was called to inform that the clinic was closed due to the pandemic and that we will call when ready to re-open.   Arelia Sneddon S,PT DPT 04/18/2018, 3:09 PM  [place] MAIN [facility] SERVICES [address] [place], [place], [address] Phone: [phone]   Fax:  [phone]

## 2018-04-19 ENCOUNTER — Ambulatory Visit: Payer: Medicare Other | Admitting: Physical Therapy

## 2018-04-24 ENCOUNTER — Ambulatory Visit: Payer: Medicare Other | Admitting: Physical Therapy

## 2018-04-26 ENCOUNTER — Ambulatory Visit: Payer: Medicare Other | Admitting: Physical Therapy

## 2018-05-02 ENCOUNTER — Ambulatory Visit: Payer: Medicare Other

## 2018-05-04 ENCOUNTER — Ambulatory Visit: Payer: Medicare Other | Admitting: Physical Therapy

## 2018-05-15 ENCOUNTER — Ambulatory Visit: Payer: Self-pay | Admitting: Family Medicine

## 2018-05-24 ENCOUNTER — Encounter: Admission: RE | Payer: Self-pay | Source: Home / Self Care

## 2018-05-24 ENCOUNTER — Ambulatory Visit: Admission: RE | Admit: 2018-05-24 | Payer: Medicare Other | Source: Home / Self Care

## 2018-05-24 SURGERY — PHACOEMULSIFICATION, CATARACT, WITH IOL INSERTION
Anesthesia: Choice | Laterality: Right

## 2018-05-29 ENCOUNTER — Ambulatory Visit (INDEPENDENT_AMBULATORY_CARE_PROVIDER_SITE_OTHER): Payer: Medicare Other | Admitting: Family Medicine

## 2018-05-29 ENCOUNTER — Encounter: Payer: Self-pay | Admitting: Family Medicine

## 2018-05-29 VITALS — BP 150/84 | Wt 95.0 lb

## 2018-05-29 DIAGNOSIS — M810 Age-related osteoporosis without current pathological fracture: Secondary | ICD-10-CM

## 2018-05-29 DIAGNOSIS — I10 Essential (primary) hypertension: Secondary | ICD-10-CM | POA: Diagnosis not present

## 2018-05-29 DIAGNOSIS — F3341 Major depressive disorder, recurrent, in partial remission: Secondary | ICD-10-CM | POA: Diagnosis not present

## 2018-05-29 DIAGNOSIS — F411 Generalized anxiety disorder: Secondary | ICD-10-CM | POA: Diagnosis not present

## 2018-05-29 MED ORDER — AMLODIPINE BESYLATE 5 MG PO TABS: 5.0000 mg | ORAL_TABLET | Freq: Every day | ORAL | 3 refills | Status: DC

## 2018-05-29 MED ORDER — ALPRAZOLAM 0.5 MG PO TABS: ORAL_TABLET | ORAL | 3 refills | Status: DC

## 2018-05-29 NOTE — Assessment & Plan Note (Signed)
Chronic and uncontrolled Previously thought to have some white coat hypertension, but running high at home as well Continue lisinopril 40mg  daily Add amlodipine 5mg  daily F/u in 1 month

## 2018-05-29 NOTE — Assessment & Plan Note (Signed)
Chronic and stable  Fairly well controlled Continue zoloft and xanax prn

## 2018-05-29 NOTE — Assessment & Plan Note (Signed)
Well controlled ?Continue zoloft 100mg daily ?

## 2018-05-29 NOTE — Progress Notes (Signed)
Patient: Amanda Tucker Female    DOB: Jun 14, 1945   73 y.o.   MRN: [id number] Visit Date: 05/29/2018  Today's Provider: [name], MD   Chief Complaint  Patient presents with  . Hypertension  . Osteoporosis   Subjective:    Virtual Visit via Video Note  I connected with Amanda Tucker on 05/29/18 at  9:40 AM EDT by a video enabled telemedicine application and verified that I am speaking with the correct person using two identifiers.   Patient location: home Provider location: home office Persons involved in the visit: patient, provider   I discussed the limitations of evaluation and management by telemedicine and the availability of in person appointments. The patient expressed understanding and agreed to proceed.  HPI  Hypertension, follow-up:  BP Readings from Last 3 Encounters:  05/29/18 (!) 150/84  03/09/18 (!) 142/85  02/27/18 (!) 136/96    She was last seen for hypertension 3 months ago.  BP at that visit was 142/85. Management changes since that visit include increase Lisinopril to 40 mg daily. She reports good compliance with treatment. She is not having side effects.  She is exercising- walking. She is not adherent to low salt diet.   Outside blood pressures are being checked at times. She is experiencing none.  Patient denies chest pain, chest pressure/discomfort, claudication, dyspnea, exertional chest pressure/discomfort, fatigue, irregular heart beat, lower extremity edema, near-syncope, orthopnea, palpitations, paroxysmal nocturnal dyspnea, syncope and tachypnea.   Cardiovascular risk factors include advanced age (older than 29 for men, 3 for women), dyslipidemia and hypertension.  Use of agents associated with hypertension: none.     Weight trend: stable Wt Readings from Last 3 Encounters:  05/29/18 95 lb (43.1 kg)  03/09/18 90 lb 3.2 oz (40.9 kg)  02/27/18 91 lb (41.3 kg)     Osteoporosis: She was last seen for this 3 months ago.   Management changes since that visit include started Fosamax weekly She reports good compliance with treatment. She is not having side effects.  ------------------------------------------------------------------- Anxiety is stable. She is surprised that it is due to [place] pandemic.   No Active Allergies   Current Outpatient Medications:  .  alendronate (FOSAMAX) 70 MG tablet, Take 1 tablet (70 mg total) by mouth once a week. Take with a full glass of water on an empty stomach., Disp: 4 tablet, Rfl: 11 .  ALPRAZolam (XANAX) 0.5 MG tablet, TAKE 1/2 TABLET BY MOUTH TWICE DAILY AS NEEDED FOR ANXIETY, Disp: 30 tablet, Rfl: 3 .  aspirin EC 81 MG tablet, Take 81 mg daily by mouth., Disp: , Rfl:  .  aspirin-acetaminophen-caffeine (EXCEDRIN MIGRAINE) 250-250-65 MG tablet, Take 2 tablets by mouth as needed for headache., Disp: , Rfl:  .  lisinopril (PRINIVIL,ZESTRIL) 40 MG tablet, Take 1 tablet (40 mg total) by mouth daily., Disp: 90 tablet, Rfl: 3 .  sertraline (ZOLOFT) 100 MG tablet, TAKE 1 TABLET(100 MG) BY MOUTH DAILY, Disp: 30 tablet, Rfl: 3 .  simvastatin (ZOCOR) 20 MG tablet, TAKE 1 TABLET(20 MG) BY MOUTH DAILY AT 6 PM, Disp: 90 tablet, Rfl: 3 .  amLODipine (NORVASC) 5 MG tablet, Take 1 tablet (5 mg total) by mouth daily., Disp: 30 tablet, Rfl: 3  Review of Systems  Constitutional: Negative.   Respiratory: Negative.   Cardiovascular: Negative.   Musculoskeletal: Negative.     Social History   Tobacco Use  . Smoking status: Former Smoker    Packs/day: 0.25    Years: 54.00  Pack years: 13.50    Types: Cigarettes    Last attempt to quit: 02/08/2018    Years since quitting: 0.3  . Smokeless tobacco: Never Used  . Tobacco comment: started smoking at age 70; smoking about 3 cigarettes throughout the day  Substance Use Topics  . Alcohol use: Yes    Alcohol/week: 5.0 standard drinks    Types: 5 Glasses of wine per week      Objective:   BP (!) 150/84   Wt 95 lb (43.1 kg)   BMI  21.30 kg/m  Vitals:   05/29/18 0930  BP: (!) 150/84  Weight: 95 lb (43.1 kg)     Physical Exam Vitals signs reviewed.  Constitutional:      Appearance: Normal appearance.  HENT:     Head: Normocephalic.  Pulmonary:     Effort: Pulmonary effort is normal. No respiratory distress.  Neurological:     Mental Status: She is alert and oriented to person, place, and time. Mental status is at baseline.  Psychiatric:        Mood and Affect: Mood normal.        Behavior: Behavior normal.         Assessment & Plan    I discussed the assessment and treatment plan with the patient. The patient was provided an opportunity to ask questions and all were answered. The patient agreed with the plan and demonstrated an understanding of the instructions.   The patient was advised to call back or seek an in-person evaluation if the symptoms worsen or if the condition fails to improve as anticipated.  Problem List Items Addressed This Visit      Cardiovascular and Mediastinum   Hypertension - Primary    Chronic and uncontrolled Previously thought to have some white coat hypertension, but running high at home as well Continue lisinopril 40mg  daily Add amlodipine 5mg  daily F/u in 1 month       Relevant Medications   amLODipine (NORVASC) 5 MG tablet     Musculoskeletal and Integument   Age-related osteoporosis without current pathological fracture    Continue fosamax weekly tolerating well Repeat DEXA in 2 years Discussed idea of drug holiday at 5 years        Other   MDD (major depressive disorder)    Well controlled Continue zoloft 100mg  daily      Relevant Medications   ALPRAZolam (XANAX) 0.5 MG tablet   Generalized anxiety disorder    Chronic and stable  Fairly well controlled Continue zoloft and xanax prn      Relevant Medications   ALPRAZolam (XANAX) 0.5 MG tablet       Return in about 4 weeks (around 06/26/2018) for BP f/u.   The entirety of the information  documented in the History of Present Illness, Review of Systems and Physical Exam were personally obtained by me. Portions of this information were initially documented by Tiburcio Pea, CMA and reviewed by me for thoroughness and accuracy.    Virginia Crews, MD, MPH [facility] 05/29/2018 9:43 AM

## 2018-05-29 NOTE — Assessment & Plan Note (Signed)
Continue fosamax weekly tolerating well Repeat DEXA in 2 years Discussed idea of drug holiday at 5 years

## 2018-06-12 ENCOUNTER — Encounter: Payer: Self-pay | Admitting: Physical Therapy

## 2018-06-12 ENCOUNTER — Other Ambulatory Visit: Payer: Self-pay

## 2018-06-12 ENCOUNTER — Ambulatory Visit: Payer: Medicare Other | Attending: Family Medicine | Admitting: Physical Therapy

## 2018-06-12 DIAGNOSIS — M6281 Muscle weakness (generalized): Secondary | ICD-10-CM | POA: Diagnosis not present

## 2018-06-12 DIAGNOSIS — R262 Difficulty in walking, not elsewhere classified: Secondary | ICD-10-CM | POA: Diagnosis not present

## 2018-06-12 DIAGNOSIS — R2689 Other abnormalities of gait and mobility: Secondary | ICD-10-CM

## 2018-06-12 NOTE — Therapy (Signed)
[place] MAIN [facility] SERVICES [address] [place], [place], [address] Phone: [phone]   Fax:  [phone]  Physical Therapy Evaluation  Patient Details  Name: Amanda Tucker MRN: [id number] Date of Birth: 29-Sep-1945 Referring Provider (PT): [name]   Encounter Date: 06/12/2018  PT End of Session - 06/12/18 1414    Visit Number  1    Number of Visits  17    Date for PT Re-Evaluation  08/07/18    PT Start Time  0205    PT Stop Time  0300    PT Time Calculation (min)  55 min    Equipment Utilized During Treatment  Gait belt    Activity Tolerance  Patient tolerated treatment well       Past Medical History:  Diagnosis Date  . Anxiety   . Depression   . Diverticulitis   . Hyperlipidemia   . Hypertension   . Right sided sciatica     Past Surgical History:  Procedure Laterality Date  . COLONOSCOPY WITH PROPOFOL N/A 02/27/2018   Procedure: COLONOSCOPY WITH PROPOFOL;  Surgeon: Jonathon Bellows, MD;  Location: [facility] ENDOSCOPY;  Service: Gastroenterology;  Laterality: N/A;  . NO PAST SURGERIES      There were no vitals filed for this visit.   Subjective Assessment - 06/12/18 1408    Subjective  Patient is feeling off balance and weakness iin BLE.    Pertinent History  Patient was doing better after her last therapy visitis and now is feeling unsteady and weak again.she doesnt use a assistive device.    Patient Stated Goals  to walk better    Currently in Pain?  Yes    Pain Score  2     Pain Location  Knee    Pain Orientation  Right    Pain Descriptors / Indicators  Aching         [facility] PT Assessment - 06/12/18 1410      Assessment   Medical Diagnosis  balance deficits    Referring Provider (PT)  angela Bacigalupo    Onset Date/Surgical Date  05/29/18    Hand Dominance  Right    Prior Therapy  yes      Precautions   Precautions  Fall      Restrictions   Weight Bearing Restrictions  No      Balance Screen   Has  the patient fallen in the past 6 months  Yes    How many times?  2    Has the patient had a decrease in activity level because of a fear of falling?   Yes    Is the patient reluctant to leave their home because of a fear of falling?   No      Home Environment   Living Environment  Private residence    Available Help at Discharge  Family    Type of [place] to enter    Entrance Stairs-Number of Steps  1    Entrance Stairs-Rails  None    Home Layout  Two level    Alternate Level Stairs-Number of Steps  12    Home Equipment  None      Prior Function   Level of Independence  Independent;Independent with basic ADLs;Independent with household mobility without device    Vocation  Retired    Leisure  reading      Cognition   Overall Cognitive Status  Within  Functional Limits for tasks assessed       The patient has been informed of current processes in place at Outpatient Rehab to protect patients from Covid-19 exposure including social distancing, schedule modifications, and new cleaning procedures. After discussing their particular risk with a therapist based on the patient's personal risk factors, the patient has decided to proceed with in-person therapy.  Coronavirus (COVID-19) Are you at risk?    POSTURE: flexed trunk and neck   PROM/AROM: WNL  STRENGTH:  Graded on a 0-5 scale Muscle Group Left Right                          Hip Flex 3+/5 3+/5  Hip Abd 3+/5 3+/5  Hip Add 2/5 2/5  Hip Ext 2/5 2/5  Hip IR/ER 4/5 4/5  Knee Flex 4/5 4/5  Knee Ext 4/5 4/5  Ankle DF 4/5 4/5  Ankle PF 4/5 4/5   SENSATION: WFL BUE and BLE  FUNCTIONAL MOBILITY:independent rolling and transfers sit to stand   BALANCE: Static Standing Balance  Normal Able to maintain standing balance against maximal resistance   Good Able to maintain standing balance against moderate resistance x  Good-/Fair+ Able to maintain standing balance against minimal resistance   Fair  Able to stand unsupported without UE support and without LOB for 1-2 min   Fair- Requires Min A and UE support to maintain standing without loss of balance   Poor+ Requires mod A and UE support to maintain standing without loss of balance   Poor Requires max A and UE support to maintain standing balance without loss    Standing Dynamic Balance  Normal Stand independently unsupported, able to weight shift and cross midline maximally   Good Stand independently unsupported, able to weight shift and cross midline moderately   Good-/Fair+ Stand independently unsupported, able to weight shift across midline minimally x  Fair Stand independently unsupported, weight shift, and reach ipsilaterally, loss of balance when crossing midline   Poor+ Able to stand with Min A and reach ipsilaterally, unable to weight shift   Poor Able to stand with Mod A and minimally reach ipsilaterally, unable to cross midline.       GAIT: Patient has path deviation with ambulation greater than 100 feet with no AD  OUTCOME MEASURES: TEST Outcome Interpretation  5 times sit<>stand 17.68sec >37 yo, >15 sec indicates increased risk for falls  10 meter walk test      1.0           m/s <1.0 m/s indicates increased risk for falls; limited community ambulator  Timed up and Go  12.66               sec <14 sec indicates increased risk for falls  6 minute walk test   800             Feet 1000 feet is community Conservator, museum/gallery Assessment 44/56 <36/56 (100% risk for falls), 37-45 (80% risk for falls); 46-51 (>50% risk for falls); 52-55 (lower risk <25% of falls)  DGI  18/24 24 / 24 is normal    Therapeutic exercise; 2 lbs hip flex x 10 2 lbs hip abd x 10 2 lbs hip ext x 10 Bridges x 10 x 2 marching x 10 x 2        Objective measurements completed on examination: See above findings.  PT Education - 06/12/18 1413    Education Details  plan of care    Person(s) Educated  Patient     Methods  Explanation    Comprehension  Verbalized understanding       PT Short Term Goals - 06/12/18 1418      PT SHORT TERM GOAL #1   Title  Patient will be independent in home exercise program to improve strength/mobility for better functional independence with ADLs.    Time  4    Period  Weeks    Status  New    Target Date  07/03/18      PT SHORT TERM GOAL #2   Title  Patient will increase Berg Balance score by > 6 points to demonstrate decreased fall risk during functional activities.    Time  4    Period  Weeks    Status  New    Target Date  07/03/18        PT Long Term Goals - 06/12/18 1418      PT LONG TERM GOAL #1   Title   Patient will be independent with ascend/descend 12 steps using single UE in step over step pattern without LOB.    Time  8    Period  Weeks    Status  New    Target Date  08/07/18      PT LONG TERM GOAL #2   Title  Patient will increase ABC scale score >80% to demonstrate better functional mobility and better confidence with ADLs.     Time  8    Status  New    Target Date  08/07/18      PT LONG TERM GOAL #3   Title  Patient will increase dynamic gait index score to >19/24 as to demonstrate reduced fall risk and improved dynamic gait balance for better safety with community/home ambulation.     Time  8    Period  Weeks    Status  New    Target Date  08/07/18             Plan - 06/12/18 1415    Clinical Impression Statement  Patient presents for PT evaluation for dx of frequent falls. She has weakness in BLE, decreased hip flex flexibility, decreased static and dynamic standing balance, and poor standing posture. She has decreased Berg balance, decreased DGI, decreased 10 MW , decreased ABC scale. She will benefit from skilled PT to improve outcome measures and decrease her risk of falls.    Clinical Decision Making  Low    Rehab Potential  Good    PT Frequency  2x / week    PT Duration  8 weeks    PT Treatment/Interventions  Gait  training;Therapeutic exercise;Patient/family education;Neuromuscular re-education;Balance training    PT Next Visit Plan  balance and therapeutic exericse    PT Home Exercise Plan  hip abd standing,    Consulted and Agree with Plan of Care  Patient       Patient will benefit from skilled therapeutic intervention in order to improve the following deficits and impairments:  Abnormal gait, Decreased balance, Decreased endurance, Decreased mobility, Difficulty walking, Pain, Decreased strength, Decreased activity tolerance  Visit Diagnosis: Muscle weakness (generalized)  Other abnormalities of gait and mobility  Difficulty in walking, not elsewhere classified     Problem List Patient Active Problem List   Diagnosis Date Noted  . IBS (irritable bowel syndrome) 03/09/2018  . Hypokalemia 03/09/2018  . Age-related  osteoporosis without current pathological fracture 03/09/2018  . Baker's cyst of knee, right 06/06/2017  . Insomnia 03/07/2017  . Tobacco abuse 12/01/2016  . Hypertension   . Hyperlipidemia   . MDD (major depressive disorder)   . Generalized anxiety disorder     Alanson Puls, [place] DPT 06/12/2018, 2:20 PM  [place] MAIN [facility] SERVICES [address] [place], [place], [address] Phone: [phone]   Fax:  [phone]  Name: Amanda Tucker MRN: [id number] Date of Birth: [birth year]

## 2018-06-14 ENCOUNTER — Ambulatory Visit: Payer: Medicare Other | Admitting: Physical Therapy

## 2018-06-14 ENCOUNTER — Encounter: Payer: Self-pay | Admitting: Physical Therapy

## 2018-06-14 ENCOUNTER — Other Ambulatory Visit: Payer: Self-pay

## 2018-06-14 DIAGNOSIS — R2689 Other abnormalities of gait and mobility: Secondary | ICD-10-CM

## 2018-06-14 DIAGNOSIS — R262 Difficulty in walking, not elsewhere classified: Secondary | ICD-10-CM | POA: Diagnosis not present

## 2018-06-14 DIAGNOSIS — M6281 Muscle weakness (generalized): Secondary | ICD-10-CM

## 2018-06-14 NOTE — Therapy (Signed)
[place] MAIN [facility] SERVICES [address] [place], [place], [address] Phone: [phone]   Fax:  [phone]  Physical Therapy Treatment  Patient Details  Name: Amanda Tucker MRN: [id number] Date of Birth: 10-Dec-1945 Referring Provider (PT): [name]   Encounter Date: 06/14/2018  PT End of Session - 06/14/18 1414    Visit Number  2    Number of Visits  17    Date for PT Re-Evaluation  08/07/18    PT Start Time  0200    PT Stop Time  0240    PT Time Calculation (min)  40 min    Equipment Utilized During Treatment  Gait belt    Activity Tolerance  Patient tolerated treatment well       Past Medical History:  Diagnosis Date  . Anxiety   . Depression   . Diverticulitis   . Hyperlipidemia   . Hypertension   . Right sided sciatica     Past Surgical History:  Procedure Laterality Date  . COLONOSCOPY WITH PROPOFOL N/A 02/27/2018   Procedure: COLONOSCOPY WITH PROPOFOL;  Surgeon: Jonathon Bellows, MD;  Location: [facility] ENDOSCOPY;  Service: Gastroenterology;  Laterality: N/A;  . NO PAST SURGERIES      There were no vitals filed for this visit.  Subjective Assessment - 06/14/18 1413    Subjective  Patient is feeling off balance and weakness iin BLE. Her knee is hurting intermittently today.     Pertinent History  Patient was doing better after her last therapy visitis and now is feeling unsteady and weak again.she doesnt use a assistive device.    Limitations  Standing;Walking    Patient Stated Goals  to walk better    Currently in Pain?  Yes    Pain Score  2     Pain Location  Knee    Pain Orientation  Right    Pain Descriptors / Indicators  Aching    Pain Type  Chronic pain    Pain Onset  Today         Treatmentl Warm up no Nu-step x 5 mins  Side stepping in parallel bars left and right x 5 x 2 wit fingertip assist  Backward walking in parallel bars with finger tip assist x 5 feet x 2 laps   Cross over pattern in side  stepping in parallel bars with finger tip assist x 5 feet x 2 laps  Ther-ex  Standing hip abd with 2# ankle weight x 15 BLE and  BUE support   Standing hip extension with 2# ankle weight x 10 BLE with BUE support   Standing hip flexion marches with 2# ankle weight x 10 BLE with BUE support   Standing HS curls with 2# ankle weight x 10 BLE with BUE support   Standing heel raises x 15 with 2# ankle weights   LAQ with 2# ankle weights with 3 second holds x10 each LE   STS from chair without UE support 2x10  Mini squats x10 with no UE support   Leg press 45 lbs x 20 x 2   CGA and Min verbal cues used throughout with increased in postural sway and LOB most seen with narrow base of support and while on uneven surfaces. Continues to have balance deficits typical with diagnosis. Patient performs intermediate level exercises without pain behaviors and needs verbal cuing for postural alignment and head positioning  PT Education - 06/14/18 1414    Education Details  HEP     Person(s) Educated  Patient    Methods  Explanation    Comprehension  Verbalized understanding       PT Short Term Goals - 06/12/18 1418      PT SHORT TERM GOAL #1   Title  Patient will be independent in home exercise program to improve strength/mobility for better functional independence with ADLs.    Time  4    Period  Weeks    Status  New    Target Date  07/03/18      PT SHORT TERM GOAL #2   Title  Patient will increase Berg Balance score by > 6 points to demonstrate decreased fall risk during functional activities.    Time  4    Period  Weeks    Status  New    Target Date  07/03/18        PT Long Term Goals - 06/12/18 1418      PT LONG TERM GOAL #1   Title   Patient will be independent with ascend/descend 12 steps using single UE in step over step pattern without LOB.    Time  8    Period  Weeks    Status  New    Target Date  08/07/18      PT  LONG TERM GOAL #2   Title  Patient will increase ABC scale score >80% to demonstrate better functional mobility and better confidence with ADLs.     Time  8    Status  New    Target Date  08/07/18      PT LONG TERM GOAL #3   Title  Patient will increase dynamic gait index score to >19/24 as to demonstrate reduced fall risk and improved dynamic gait balance for better safety with community/home ambulation.     Time  8    Period  Weeks    Status  New    Target Date  08/07/18            Plan - 06/14/18 1415    Clinical Impression Statement  Pt requires direction and verbal cues for correct performance of exercises. Patient demonstrates weakness in BUE and performs open and closed chain exercises with no reports of pain. Patient performs intermediate standing dynamic standing balance exercises to shift weight and perform single leg standing activities with mod assist. Pt was able to perform all exercises with min assist and VC for technique Pt encouraged continuing HEP .Follow-up as scheduled    Rehab Potential  Good    PT Frequency  2x / week    PT Duration  8 weeks    PT Treatment/Interventions  Gait training;Therapeutic exercise;Patient/family education;Neuromuscular re-education;Balance training    PT Next Visit Plan  balance and therapeutic exericse    PT Home Exercise Plan  hip abd standing,    Consulted and Agree with Plan of Care  Patient       Patient will benefit from skilled therapeutic intervention in order to improve the following deficits and impairments:  Abnormal gait, Decreased balance, Decreased endurance, Decreased mobility, Difficulty walking, Pain, Decreased strength, Decreased activity tolerance  Visit Diagnosis: Muscle weakness (generalized)  Other abnormalities of gait and mobility  Difficulty in walking, not elsewhere classified     Problem List Patient Active Problem List   Diagnosis Date Noted  . IBS (irritable bowel syndrome) 03/09/2018  .  Hypokalemia 03/09/2018  . Age-related  osteoporosis without current pathological fracture 03/09/2018  . Baker's cyst of knee, right 06/06/2017  . Insomnia 03/07/2017  . Tobacco abuse 12/01/2016  . Hypertension   . Hyperlipidemia   . MDD (major depressive disorder)   . Generalized anxiety disorder     Alanson Puls, [place] DPT 06/14/2018, 2:18 PM  [place] MAIN [facility] SERVICES [address] [place], [place], [address] Phone: [phone]   Fax:  [phone]  Name: Amanda Tucker MRN: [id number] Date of Birth: 1945-02-25

## 2018-06-20 ENCOUNTER — Other Ambulatory Visit: Payer: Self-pay

## 2018-06-20 ENCOUNTER — Ambulatory Visit: Payer: Medicare Other | Admitting: Physical Therapy

## 2018-06-20 DIAGNOSIS — M6281 Muscle weakness (generalized): Secondary | ICD-10-CM

## 2018-06-20 DIAGNOSIS — R262 Difficulty in walking, not elsewhere classified: Secondary | ICD-10-CM | POA: Diagnosis not present

## 2018-06-20 DIAGNOSIS — R2689 Other abnormalities of gait and mobility: Secondary | ICD-10-CM

## 2018-06-20 NOTE — Therapy (Signed)
[place] MAIN [facility] SERVICES [address] [place], [place], [address] Phone: [phone]   Fax:  [phone]  Physical Therapy Treatment  Patient Details  Name: Amanda Tucker MRN: [id number] Date of Birth: 10-30-1945 Referring Provider (PT): [name]   Encounter Date: 06/20/2018  PT End of Session - 06/20/18 1135    Visit Number  3    Number of Visits  17    Date for PT Re-Evaluation  08/07/18    PT Start Time  1100    PT Stop Time  1145    PT Time Calculation (min)  45 min    Equipment Utilized During Treatment  Gait belt    Activity Tolerance  Patient tolerated treatment well       Past Medical History:  Diagnosis Date  . Anxiety   . Depression   . Diverticulitis   . Hyperlipidemia   . Hypertension   . Right sided sciatica     Past Surgical History:  Procedure Laterality Date  . COLONOSCOPY WITH PROPOFOL N/A 02/27/2018   Procedure: COLONOSCOPY WITH PROPOFOL;  Surgeon: Jonathon Bellows, MD;  Location: [facility] ENDOSCOPY;  Service: Gastroenterology;  Laterality: N/A;  . NO PAST SURGERIES      There were no vitals filed for this visit.  Subjective Assessment - 06/20/18 1134    Subjective  Patient is feeling off balance and weakness iin BLE. Her knee is hurting intermittently today.     Pertinent History  Patient was doing better after her last therapy visitis and now is feeling unsteady and weak again.she doesnt use a assistive device.    Limitations  Standing;Walking    Patient Stated Goals  to walk better    Currently in Pain?  No/denies    Pain Score  0-No pain    Pain Onset  Today       Treatment:  nustep level56for warm up X61mins  BOSU lunges x 15 x 2  BLE, cues for correct technuque  Pro stretch x 30 sec x 20 BLE  Leg press x 20 x 3, 75 lbs  Leg press heel raises 45 lbs x 20 x 2 sets   Standing on 1/2 bolster (Flat side up): Heel/toe rock2x10with finger tip hold for balance with cues to keep knee  straight for better ankle strategies;  Fwd/bwd/side stepping left and right with 12. 5 lbs on matrix x 5 reps with CGA and cues for posture  Cues for correct technique.  Patient needed several sitting rest breaks during session.                         PT Education - 06/20/18 1134    Education Details  HEP    Person(s) Educated  Patient    Methods  Explanation    Comprehension  Verbalized understanding       PT Short Term Goals - 06/12/18 1418      PT SHORT TERM GOAL #1   Title  Patient will be independent in home exercise program to improve strength/mobility for better functional independence with ADLs.    Time  4    Period  Weeks    Status  New    Target Date  07/03/18      PT SHORT TERM GOAL #2   Title  Patient will increase Berg Balance score by > 6 points to demonstrate decreased fall risk during functional activities.    Time  4  Period  Weeks    Status  New    Target Date  07/03/18        PT Long Term Goals - 06/12/18 1418      PT LONG TERM GOAL #1   Title   Patient will be independent with ascend/descend 12 steps using single UE in step over step pattern without LOB.    Time  8    Period  Weeks    Status  New    Target Date  08/07/18      PT LONG TERM GOAL #2   Title  Patient will increase ABC scale score >80% to demonstrate better functional mobility and better confidence with ADLs.     Time  8    Status  New    Target Date  08/07/18      PT LONG TERM GOAL #3   Title  Patient will increase dynamic gait index score to >19/24 as to demonstrate reduced fall risk and improved dynamic gait balance for better safety with community/home ambulation.     Time  8    Period  Weeks    Status  New    Target Date  08/07/18            Plan - 06/20/18 1136    Clinical Impression Statement  Pt requires direction and verbal cues for correct performance of exercises. Patient demonstrates weakness in BUE and performs open and closed  chain exercises with no reports of pain. Patient performs intermediate standing dynamic standing balance exercises to shift weight and perform single leg standing activities with mod assist. Pt was able to perform all exercises with min assist and VC for technique Pt encouraged continuing HEP .Follow-up as scheduled    Rehab Potential  Good    PT Frequency  2x / week    PT Duration  8 weeks    PT Treatment/Interventions  Gait training;Therapeutic exercise;Patient/family education;Neuromuscular re-education;Balance training    PT Next Visit Plan  balance and therapeutic exericse    PT Home Exercise Plan  hip abd standing,    Consulted and Agree with Plan of Care  Patient       Patient will benefit from skilled therapeutic intervention in order to improve the following deficits and impairments:  Abnormal gait, Decreased balance, Decreased endurance, Decreased mobility, Difficulty walking, Pain, Decreased strength, Decreased activity tolerance  Visit Diagnosis: Muscle weakness (generalized)  Other abnormalities of gait and mobility  Difficulty in walking, not elsewhere classified     Problem List Patient Active Problem List   Diagnosis Date Noted  . IBS (irritable bowel syndrome) 03/09/2018  . Hypokalemia 03/09/2018  . Age-related osteoporosis without current pathological fracture 03/09/2018  . Baker's cyst of knee, right 06/06/2017  . Insomnia 03/07/2017  . Tobacco abuse 12/01/2016  . Hypertension   . Hyperlipidemia   . MDD (major depressive disorder)   . Generalized anxiety disorder     Alanson Puls, [place] DPT 06/20/2018, 11:41 AM  [place] MAIN [facility] SERVICES [address] [place], [place], [address] Phone: [phone]   Fax:  [phone]  Name: Amanda Tucker MRN: [id number] Date of Birth: 09-Nov-1945

## 2018-06-22 ENCOUNTER — Ambulatory Visit: Payer: Medicare Other | Admitting: Physical Therapy

## 2018-06-22 ENCOUNTER — Other Ambulatory Visit: Payer: Self-pay

## 2018-06-22 DIAGNOSIS — M6281 Muscle weakness (generalized): Secondary | ICD-10-CM | POA: Diagnosis not present

## 2018-06-22 DIAGNOSIS — R262 Difficulty in walking, not elsewhere classified: Secondary | ICD-10-CM

## 2018-06-22 DIAGNOSIS — R2689 Other abnormalities of gait and mobility: Secondary | ICD-10-CM | POA: Diagnosis not present

## 2018-06-22 NOTE — Therapy (Signed)
[place] MAIN [facility] SERVICES [address] [place], [place], [address] Phone: [phone]   Fax:  [phone]  Physical Therapy Treatment  Patient Details  Name: Amanda Tucker MRN: [id number] Date of Birth: 1945/11/25 Referring Provider (PT): [name]   Encounter Date: 06/22/2018  PT End of Session - 06/22/18 1105    Visit Number  4    Number of Visits  17    Date for PT Re-Evaluation  08/07/18    PT Start Time  1100    PT Stop Time  1140    PT Time Calculation (min)  40 min    Equipment Utilized During Treatment  Gait belt    Activity Tolerance  Patient tolerated treatment well       Past Medical History:  Diagnosis Date  . Anxiety   . Depression   . Diverticulitis   . Hyperlipidemia   . Hypertension   . Right sided sciatica     Past Surgical History:  Procedure Laterality Date  . COLONOSCOPY WITH PROPOFOL N/A 02/27/2018   Procedure: COLONOSCOPY WITH PROPOFOL;  Surgeon: Jonathon Bellows, MD;  Location: [facility] ENDOSCOPY;  Service: Gastroenterology;  Laterality: N/A;  . NO PAST SURGERIES      There were no vitals filed for this visit.  Subjective Assessment - 06/22/18 1102    Subjective  Patient is feeling off balance and weakness iin BLE. Her knee is hurting intermittently today.     Pertinent History  Patient was doing better after her last therapy visitis and now is feeling unsteady and weak again.she doesnt use a assistive device.    Limitations  Standing;Walking    Patient Stated Goals  to walk better    Currently in Pain?  Yes    Pain Score  2     Pain Location  Knee    Pain Orientation  Right    Pain Descriptors / Indicators  Aching    Pain Onset  Today         TREATMENT   Ther-ex Quantum leg press 45 # x 20, Quantum heel raises 45# 2 x 20; Step-ups to 6' step without UE support alternating LE x 10 each; Prone hip extension x 10 x 2  Pone hip extension with knee flex x 10 x 2  sidelying hip abd x 10  BLE SLR x 10 x 2 BLE  Neuromuscular Re-education Lunges left and right x 10  Toe taps to 6" step without UE support alternating LE x10each  1/2 foam roll (round side up) static tandem balance alternating forward LE x 30s each;;  Pt educated throughout session about proper posture and technique with exercises. Improved exercise technique, movement at target joints, use of target muscles after min to mod verbal, visual, tactile cues. Pt requires direction and verbal cues for correct performance of standing dynamic balance exercises and strengthening exercises.                       PT Education - 06/22/18 1103    Education Details  HEP    Person(s) Educated  Patient    Methods  Explanation    Comprehension  Verbalized understanding       PT Short Term Goals - 06/12/18 1418      PT SHORT TERM GOAL #1   Title  Patient will be independent in home exercise program to improve strength/mobility for better functional independence with ADLs.    Time  4  Period  Weeks    Status  New    Target Date  07/03/18      PT SHORT TERM GOAL #2   Title  Patient will increase Berg Balance score by > 6 points to demonstrate decreased fall risk during functional activities.    Time  4    Period  Weeks    Status  New    Target Date  07/03/18        PT Long Term Goals - 06/12/18 1418      PT LONG TERM GOAL #1   Title   Patient will be independent with ascend/descend 12 steps using single UE in step over step pattern without LOB.    Time  8    Period  Weeks    Status  New    Target Date  08/07/18      PT LONG TERM GOAL #2   Title  Patient will increase ABC scale score >80% to demonstrate better functional mobility and better confidence with ADLs.     Time  8    Status  New    Target Date  08/07/18      PT LONG TERM GOAL #3   Title  Patient will increase dynamic gait index score to >19/24 as to demonstrate reduced fall risk and improved dynamic gait balance for  better safety with community/home ambulation.     Time  8    Period  Weeks    Status  New    Target Date  08/07/18            Plan - 06/22/18 1106    Clinical Impression Statement  Patient demonstrates improved stability and strength allowing patient to perform short duration standing interventions with rest periods.  Patient performs beginning standing dynamic standing balance exercises to shift weight and perform single leg standing activities with mod assist. Patient fatigues quickly with exercises requiring rest breaks at this time. Patient will continue to benefit from skilled physical therapy to improve pain and mobility.    Rehab Potential  Good    PT Frequency  2x / week    PT Duration  8 weeks    PT Treatment/Interventions  Gait training;Therapeutic exercise;Patient/family education;Neuromuscular re-education;Balance training    PT Next Visit Plan  balance and therapeutic exericse    PT Home Exercise Plan  hip abd standing,    Consulted and Agree with Plan of Care  Patient       Patient will benefit from skilled therapeutic intervention in order to improve the following deficits and impairments:  Abnormal gait, Decreased balance, Decreased endurance, Decreased mobility, Difficulty walking, Pain, Decreased strength, Decreased activity tolerance  Visit Diagnosis: Muscle weakness (generalized)  Other abnormalities of gait and mobility  Difficulty in walking, not elsewhere classified     Problem List Patient Active Problem List   Diagnosis Date Noted  . IBS (irritable bowel syndrome) 03/09/2018  . Hypokalemia 03/09/2018  . Age-related osteoporosis without current pathological fracture 03/09/2018  . Baker's cyst of knee, right 06/06/2017  . Insomnia 03/07/2017  . Tobacco abuse 12/01/2016  . Hypertension   . Hyperlipidemia   . MDD (major depressive disorder)   . Generalized anxiety disorder     Alanson Puls, [place] DPT 06/22/2018, 11:11 AM  [place] MAIN [facility] SERVICES [address] [place], [place], [address] Phone: [phone]   Fax:  [phone]  Name: Amanda Tucker MRN: [id number] Date of Birth: [birth year]

## 2018-06-27 ENCOUNTER — Ambulatory Visit: Payer: Medicare Other | Attending: Family Medicine | Admitting: Physical Therapy

## 2018-06-27 ENCOUNTER — Other Ambulatory Visit: Payer: Self-pay

## 2018-06-27 DIAGNOSIS — R2689 Other abnormalities of gait and mobility: Secondary | ICD-10-CM | POA: Diagnosis not present

## 2018-06-27 DIAGNOSIS — R262 Difficulty in walking, not elsewhere classified: Secondary | ICD-10-CM | POA: Insufficient documentation

## 2018-06-27 DIAGNOSIS — M6281 Muscle weakness (generalized): Secondary | ICD-10-CM | POA: Diagnosis not present

## 2018-06-27 NOTE — Therapy (Signed)
[place] MAIN [facility] SERVICES [address] [place], [place], [address] Phone: [phone]   Fax:  [phone]  Physical Therapy Treatment  Patient Details  Name: Amanda Tucker MRN: [id number] Date of Birth: 1945/11/11 Referring Provider (PT): [name]   Encounter Date: 06/27/2018  PT End of Session - 06/27/18 1324    Visit Number  5    Number of Visits  17    Date for PT Re-Evaluation  08/07/18    PT Start Time  0105    PT Stop Time  0145    PT Time Calculation (min)  40 min    Equipment Utilized During Treatment  Gait belt    Activity Tolerance  Patient tolerated treatment well       Past Medical History:  Diagnosis Date  . Anxiety   . Depression   . Diverticulitis   . Hyperlipidemia   . Hypertension   . Right sided sciatica     Past Surgical History:  Procedure Laterality Date  . COLONOSCOPY WITH PROPOFOL N/A 02/27/2018   Procedure: COLONOSCOPY WITH PROPOFOL;  Surgeon: Jonathon Bellows, MD;  Location: [facility] ENDOSCOPY;  Service: Gastroenterology;  Laterality: N/A;  . NO PAST SURGERIES      There were no vitals filed for this visit.  Subjective Assessment - 06/27/18 1323    Subjective  Patient is feeling off balance and weakness iin BLE. Her knee is hurting intermittently today.     Pertinent History  Patient was doing better after her last therapy visitis and now is feeling unsteady and weak again.she doesnt use a assistive device.    Limitations  Standing;Walking    Patient Stated Goals  to walk better    Currently in Pain?  No/denies    Pain Onset  Today          TREATMENT   Ther-ex TM walking with . 5 mile/ hour with min assist x 1 min Quantum leg press 45 # x 20, Quantum heel raises 45# 2 x 20; Step-ups to 6' step without UE support alternating LE x 10 each; Standing hip ext with YTB x 15 x 2 BLE  Standing hip abd  with YTB x 15 x 2 BLE  SLR x 10 x 2 BLE  Neuromuscular Re-education Lunges left and  right x 10  Toe taps to 6" step without UE support alternating LE x10each  1/2 foam roll (round side up) static tandem balance alternating forward LE x 30s each;;  Pt educated throughout session about proper posture and technique with exercises. Improved exercise technique, movement at target joints, use of target muscles after min to mod verbal, visual, tactile cues. Pt requires direction and verbal cues for correct performance of standing dynamic balance exercises and strengthening exercises                       PT Education - 06/27/18 1324    Education Details  HEP    Person(s) Educated  Patient    Methods  Explanation    Comprehension  Verbalized understanding;Need further instruction       PT Short Term Goals - 06/12/18 1418      PT SHORT TERM GOAL #1   Title  Patient will be independent in home exercise program to improve strength/mobility for better functional independence with ADLs.    Time  4    Period  Weeks    Status  New    Target Date  07/03/18  PT SHORT TERM GOAL #2   Title  Patient will increase Berg Balance score by > 6 points to demonstrate decreased fall risk during functional activities.    Time  4    Period  Weeks    Status  New    Target Date  07/03/18        PT Long Term Goals - 06/12/18 1418      PT LONG TERM GOAL #1   Title   Patient will be independent with ascend/descend 12 steps using single UE in step over step pattern without LOB.    Time  8    Period  Weeks    Status  New    Target Date  08/07/18      PT LONG TERM GOAL #2   Title  Patient will increase ABC scale score >80% to demonstrate better functional mobility and better confidence with ADLs.     Time  8    Status  New    Target Date  08/07/18      PT LONG TERM GOAL #3   Title  Patient will increase dynamic gait index score to >19/24 as to demonstrate reduced fall risk and improved dynamic gait balance for better safety with community/home ambulation.      Time  8    Period  Weeks    Status  New    Target Date  08/07/18            Plan - 06/27/18 1325    Clinical Impression Statement  .Patient demonstrates LOB with standing balance exercises indicating decreased balancing strategies. Patient did require UE support to perform sidestepping up and over exercise. Patient will benefit from further skilled therapy to return to prior level of function. .  Pt was encouraged to perform HEP during the week in order to continue progressing balance and strength interventions.  Pt would continue to benefit from skilled therapy services in order to further address LE strength deficits and balance deficits in order to decrease fall risk and improve mobility.    Rehab Potential  Good    PT Frequency  2x / week    PT Duration  8 weeks    PT Treatment/Interventions  Gait training;Therapeutic exercise;Patient/family education;Neuromuscular re-education;Balance training    PT Next Visit Plan  balance and therapeutic exericse    PT Home Exercise Plan  hip abd standing,    Consulted and Agree with Plan of Care  Patient       Patient will benefit from skilled therapeutic intervention in order to improve the following deficits and impairments:  Abnormal gait, Decreased balance, Decreased endurance, Decreased mobility, Difficulty walking, Pain, Decreased strength, Decreased activity tolerance  Visit Diagnosis: Muscle weakness (generalized)  Other abnormalities of gait and mobility  Difficulty in walking, not elsewhere classified     Problem List Patient Active Problem List   Diagnosis Date Noted  . IBS (irritable bowel syndrome) 03/09/2018  . Hypokalemia 03/09/2018  . Age-related osteoporosis without current pathological fracture 03/09/2018  . Baker's cyst of knee, right 06/06/2017  . Insomnia 03/07/2017  . Tobacco abuse 12/01/2016  . Hypertension   . Hyperlipidemia   . MDD (major depressive disorder)   . Generalized anxiety disorder      Alanson Puls ,[place] DPT 06/27/2018, 2:36 PM  [place] MAIN [facility] SERVICES [address] [place], [place], [address] Phone: [phone]   Fax:  [phone]  Name: Amanda Tucker MRN: [id number] Date of Birth: May 28, 1945

## 2018-06-29 ENCOUNTER — Ambulatory Visit: Payer: Medicare Other | Admitting: Physical Therapy

## 2018-06-29 ENCOUNTER — Encounter: Payer: Self-pay | Admitting: Physical Therapy

## 2018-06-29 ENCOUNTER — Encounter: Payer: Self-pay | Admitting: Family Medicine

## 2018-06-29 ENCOUNTER — Other Ambulatory Visit: Payer: Self-pay

## 2018-06-29 ENCOUNTER — Ambulatory Visit (INDEPENDENT_AMBULATORY_CARE_PROVIDER_SITE_OTHER): Payer: Medicare Other | Admitting: Family Medicine

## 2018-06-29 VITALS — BP 126/80 | Ht <= 58 in | Wt 95.4 lb

## 2018-06-29 DIAGNOSIS — R262 Difficulty in walking, not elsewhere classified: Secondary | ICD-10-CM | POA: Diagnosis not present

## 2018-06-29 DIAGNOSIS — M6281 Muscle weakness (generalized): Secondary | ICD-10-CM | POA: Diagnosis not present

## 2018-06-29 DIAGNOSIS — R2689 Other abnormalities of gait and mobility: Secondary | ICD-10-CM | POA: Diagnosis not present

## 2018-06-29 DIAGNOSIS — I10 Essential (primary) hypertension: Secondary | ICD-10-CM

## 2018-06-29 MED ORDER — AMLODIPINE BESYLATE 5 MG PO TABS: 5.0000 mg | ORAL_TABLET | Freq: Every day | ORAL | 3 refills | Status: DC

## 2018-06-29 MED ORDER — SERTRALINE HCL 100 MG PO TABS: 100.0000 mg | ORAL_TABLET | Freq: Every day | ORAL | 3 refills | Status: DC

## 2018-06-29 NOTE — Assessment & Plan Note (Signed)
Chronic and well controlled Continue lisinopril and amlodipine at current doses F/u in ~5 months as scheduled Repeat labs at next visit

## 2018-06-29 NOTE — Progress Notes (Signed)
Patient: Amanda Tucker Female    DOB: 02/23/1945   73 y.o.   MRN: [id number] Visit Date: 06/29/2018  Today's Provider: [name], MD   Chief Complaint  Patient presents with  . Hypertension   Subjective:    Virtual Visit via Video Note  I connected with Amanda Tucker on 06/29/18 at  8:20 AM EDT by a video enabled telemedicine application and verified that I am speaking with the correct person using two identifiers.   Patient location: home Provider location: [place] involved in the visit: patient, provider   I discussed the limitations of evaluation and management by telemedicine and the availability of in person appointments. The patient expressed understanding and agreed to proceed.  HPI  Hypertension, follow-up:  BP Readings from Last 3 Encounters:  06/29/18 126/80  05/29/18 (!) 150/84  03/09/18 (!) 142/85    She was last seen for hypertension 1 months ago.  BP at that visit was 150/84. Management changes since that visit include advised to continue Lisinopril 40 mg and add Amlodipine 5 mg . She reports good compliance with treatment. She is not having side effects.  She is not exercising. She is adherent to low salt diet.   Outside blood pressures are being checked at home. BP readings are within normal range. She is experiencing none.  Patient denies chest pain, chest pressure/discomfort, claudication, dyspnea, exertional chest pressure/discomfort, fatigue, irregular heart beat, lower extremity edema, near-syncope, orthopnea, palpitations, paroxysmal nocturnal dyspnea, syncope and tachypnea.   Cardiovascular risk factors include advanced age (older than 49 for men, 22 for women), dyslipidemia and hypertension.  Use of agents associated with hypertension: none.     Weight trend: stable  Wt Readings from Last 3 Encounters:  06/29/18 95 lb 6.4 oz (43.3 kg)  05/29/18 95 lb (43.1 kg)  03/09/18 90 lb 3.2 oz (40.9 kg)     -----------------------------------------------------------------------  No Known Allergies   Current Outpatient Medications:  .  alendronate (FOSAMAX) 70 MG tablet, Take 1 tablet (70 mg total) by mouth once a week. Take with a full glass of water on an empty stomach., Disp: 4 tablet, Rfl: 11 .  ALPRAZolam (XANAX) 0.5 MG tablet, TAKE 1/2 TABLET BY MOUTH TWICE DAILY AS NEEDED FOR ANXIETY, Disp: 30 tablet, Rfl: 3 .  amLODipine (NORVASC) 5 MG tablet, Take 1 tablet (5 mg total) by mouth daily., Disp: 30 tablet, Rfl: 3 .  aspirin EC 81 MG tablet, Take 81 mg daily by mouth., Disp: , Rfl:  .  aspirin-acetaminophen-caffeine (EXCEDRIN MIGRAINE) 250-250-65 MG tablet, Take 2 tablets by mouth as needed for headache., Disp: , Rfl:  .  lisinopril (PRINIVIL,ZESTRIL) 40 MG tablet, Take 1 tablet (40 mg total) by mouth daily., Disp: 90 tablet, Rfl: 3 .  sertraline (ZOLOFT) 100 MG tablet, TAKE 1 TABLET(100 MG) BY MOUTH DAILY, Disp: 30 tablet, Rfl: 3 .  simvastatin (ZOCOR) 20 MG tablet, TAKE 1 TABLET(20 MG) BY MOUTH DAILY AT 6 PM, Disp: 90 tablet, Rfl: 3  Review of Systems  Constitutional: Negative.   Respiratory: Negative.   Cardiovascular: Negative.   Musculoskeletal: Negative.     Social History   Tobacco Use  . Smoking status: Former Smoker    Packs/day: 0.25    Years: 54.00    Pack years: 13.50    Types: Cigarettes    Last attempt to quit: 02/08/2018    Years since quitting: 0.3  . Smokeless tobacco: Never Used  . Tobacco comment: started smoking at age  17; smoking about 3 cigarettes throughout the day  Substance Use Topics  . Alcohol use: Yes    Alcohol/week: 5.0 standard drinks    Types: 5 Glasses of wine per week      Objective:   BP 126/80   Ht 4\' 9"  (1.448 m)   Wt 95 lb 6.4 oz (43.3 kg)   BMI 20.64 kg/m  Vitals:   06/29/18 0820  BP: 126/80  Weight: 95 lb 6.4 oz (43.3 kg)  Height: 4\' 9"  (1.448 m)     Physical Exam Constitutional:      Appearance: Normal appearance.   Pulmonary:     Effort: Pulmonary effort is normal. No respiratory distress.  Neurological:     Mental Status: She is alert and oriented to person, place, and time. Mental status is at baseline.  Psychiatric:        Mood and Affect: Mood normal.        Behavior: Behavior normal.         Assessment & Plan    I discussed the assessment and treatment plan with the patient. The patient was provided an opportunity to ask questions and all were answered. The patient agreed with the plan and demonstrated an understanding of the instructions.   The patient was advised to call back or seek an in-person evaluation if the symptoms worsen or if the condition fails to improve as anticipated.  Problem List Items Addressed This Visit      Cardiovascular and Mediastinum   Hypertension - Primary    Chronic and well controlled Continue lisinopril and amlodipine at current doses F/u in ~5 months as scheduled Repeat labs at next visit      Relevant Medications   amLODipine (NORVASC) 5 MG tablet       Return in about 5 months (around 11/29/2018) for as scheduled for [facility].   The entirety of the information documented in the History of Present Illness, Review of Systems and Physical Exam were personally obtained by me. Portions of this information were initially documented by Tiburcio Pea, CMA and reviewed by me for thoroughness and accuracy.    Erubiel Manasco, [name], MD MPH [facility]

## 2018-06-29 NOTE — Therapy (Signed)
[place] MAIN [facility] SERVICES [address] [place], [place], [address] Phone: [phone]   Fax:  [phone]  Physical Therapy Treatment  Patient Details  Name: Amanda Tucker MRN: [id number] Date of Birth: 1945/10/10 Referring Provider (PT): [name]   Encounter Date: 06/29/2018  PT End of Session - 06/29/18 1301    Visit Number  6    Number of Visits  17    Date for PT Re-Evaluation  08/07/18    PT Start Time  3299    PT Stop Time  1340    PT Time Calculation (min)  45 min    Equipment Utilized During Treatment  Gait belt    Activity Tolerance  Patient tolerated treatment well;Patient limited by fatigue    Behavior During Therapy  WFL for tasks assessed/performed       Past Medical History:  Diagnosis Date  . Anxiety   . Depression   . Diverticulitis   . Hyperlipidemia   . Hypertension   . Right sided sciatica     Past Surgical History:  Procedure Laterality Date  . COLONOSCOPY WITH PROPOFOL N/A 02/27/2018   Procedure: COLONOSCOPY WITH PROPOFOL;  Surgeon: Jonathon Bellows, MD;  Location: [facility] ENDOSCOPY;  Service: Gastroenterology;  Laterality: N/A;  . NO PAST SURGERIES      There were no vitals filed for this visit.  Subjective Assessment - 06/29/18 1300    Subjective  Patient is feeling off balance and weakness iin BLE. Patient says that her mask is really bothering her today,    Pertinent History  Patient was doing better after her last therapy visitis and now is feeling unsteady and weak again.she doesnt use a assistive device.    Limitations  Standing;Walking    Patient Stated Goals  to walk better    Currently in Pain?  No/denies    Pain Score  0-No pain    Pain Onset  Today       Ther-ex  Standing hip abd with YTB x 15 BLE and  BUE support  Standing hip extension with YTB x 10 BLE with BUE support  Standing hip flexion marches with 2# ankle weight x 10 BLE with BUE support  Standing heel raises x 15  LAQ with  2# ankle weights with 3 second holds x10 each LE  STS from chair without UE support 2x10  Leg press 35 lbs heel raises x 20 x 3 Leg press 60 lbs x 20 x 2      Neuromuscular Re-education  Toe taps to 6" step without UE support alternating LE x15 each  Normal stance on Airex pad 2x60sec   Forward and backward stepping over hurdle x15 each direction  SIde stepping over hurdle 15x each direction  Pt educated throughout session about proper posture and technique with exercises. Improved exercise technique, movement at target joints, use of target muscles after min to mod verbal, visual, tactile cues.                        PT Education - 06/29/18 1301    Education Details  HEP    Person(s) Educated  Patient    Methods  Explanation    Comprehension  Verbalized understanding;Returned demonstration;Need further instruction       PT Short Term Goals - 06/12/18 1418      PT SHORT TERM GOAL #1   Title  Patient will be independent in home exercise program to improve strength/mobility  for better functional independence with ADLs.    Time  4    Period  Weeks    Status  New    Target Date  07/03/18      PT SHORT TERM GOAL #2   Title  Patient will increase Berg Balance score by > 6 points to demonstrate decreased fall risk during functional activities.    Time  4    Period  Weeks    Status  New    Target Date  07/03/18        PT Long Term Goals - 06/12/18 1418      PT LONG TERM GOAL #1   Title   Patient will be independent with ascend/descend 12 steps using single UE in step over step pattern without LOB.    Time  8    Period  Weeks    Status  New    Target Date  08/07/18      PT LONG TERM GOAL #2   Title  Patient will increase ABC scale score >80% to demonstrate better functional mobility and better confidence with ADLs.     Time  8    Status  New    Target Date  08/07/18      PT LONG TERM GOAL #3   Title  Patient will increase dynamic gait index score  to >19/24 as to demonstrate reduced fall risk and improved dynamic gait balance for better safety with community/home ambulation.     Time  8    Period  Weeks    Status  New    Target Date  08/07/18            Plan - 06/29/18 1303    Clinical Impression Statement  Pt was able to progress through exercises today with decreases in loss of dynamic standing  during reaching activities.  Pt continues to demonstrate improvement with dynamic and static balance, with decreased LOB noted with dynamic activities on even and uneven surfaces.  Pt has decreased strength and single leg stability.  Pt would continue to benefit from skilled therapy services in order to continue strengthening LE's and improving dynamic and static balance.    Rehab Potential  Good    PT Frequency  2x / week    PT Duration  8 weeks    PT Treatment/Interventions  Gait training;Therapeutic exercise;Patient/family education;Neuromuscular re-education;Balance training    PT Next Visit Plan  balance and therapeutic exericse    PT Home Exercise Plan  hip abd standing,    Consulted and Agree with Plan of Care  Patient       Patient will benefit from skilled therapeutic intervention in order to improve the following deficits and impairments:  Abnormal gait, Decreased balance, Decreased endurance, Decreased mobility, Difficulty walking, Pain, Decreased strength, Decreased activity tolerance  Visit Diagnosis: Muscle weakness (generalized)  Other abnormalities of gait and mobility  Difficulty in walking, not elsewhere classified     Problem List Patient Active Problem List   Diagnosis Date Noted  . IBS (irritable bowel syndrome) 03/09/2018  . Hypokalemia 03/09/2018  . Age-related osteoporosis without current pathological fracture 03/09/2018  . Baker's cyst of knee, right 06/06/2017  . Insomnia 03/07/2017  . Tobacco abuse 12/01/2016  . Hypertension   . Hyperlipidemia   . MDD (major depressive disorder)   .  Generalized anxiety disorder     Alanson Puls, [place] DPT 06/29/2018, 1:06 PM  [place] MAIN [facility] SERVICES [address]  [place], [place], [address] Phone: [phone]   Fax:  [phone]  Name: Amanda Tucker MRN: [id number] Date of Birth: 05/02/1945

## 2018-07-04 ENCOUNTER — Other Ambulatory Visit: Payer: Self-pay

## 2018-07-04 ENCOUNTER — Ambulatory Visit: Payer: Medicare Other | Admitting: Physical Therapy

## 2018-07-04 ENCOUNTER — Encounter: Payer: Self-pay | Admitting: Physical Therapy

## 2018-07-04 DIAGNOSIS — M6281 Muscle weakness (generalized): Secondary | ICD-10-CM | POA: Diagnosis not present

## 2018-07-04 DIAGNOSIS — R262 Difficulty in walking, not elsewhere classified: Secondary | ICD-10-CM

## 2018-07-04 DIAGNOSIS — R2689 Other abnormalities of gait and mobility: Secondary | ICD-10-CM

## 2018-07-04 NOTE — Therapy (Signed)
[place] MAIN [facility] SERVICES [address] [place], [place], [address] Phone: [phone]   Fax:  [phone]  Physical Therapy Treatment  Patient Details  Name: Amanda Tucker MRN: [id number] Date of Birth: Aug 27, 1945 Referring Provider (PT): [name]   Encounter Date: 07/04/2018  PT End of Session - 07/04/18 1120    Visit Number  7    Number of Visits  17    Date for PT Re-Evaluation  08/07/18    PT Start Time  1115    PT Stop Time  1200    PT Time Calculation (min)  45 min    Equipment Utilized During Treatment  Gait belt    Activity Tolerance  Patient tolerated treatment well;Patient limited by fatigue    Behavior During Therapy  [facility] for tasks assessed/performed       Past Medical History:  Diagnosis Date  . Anxiety   . Depression   . Diverticulitis   . Hyperlipidemia   . Hypertension   . Right sided sciatica     Past Surgical History:  Procedure Laterality Date  . COLONOSCOPY WITH PROPOFOL N/A 02/27/2018   Procedure: COLONOSCOPY WITH PROPOFOL;  Surgeon: Jonathon Bellows, MD;  Location: [facility] ENDOSCOPY;  Service: Gastroenterology;  Laterality: N/A;  . NO PAST SURGERIES      There were no vitals filed for this visit.  Subjective Assessment - 07/04/18 1119    Subjective  Patient is feeling off balance and weakness iin BLE.     Pertinent History  Patient was doing better after her last therapy visitis and now is feeling unsteady and weak again.she doesnt use a assistive device.    Limitations  Standing;Walking    Patient Stated Goals  to walk better    Currently in Pain?  No/denies    Pain Score  0-No pain    Pain Onset  Today        Treatment:   Nu-step   x 5 mins L 5   Resisted walking forward/backward 12.5 lbs x10. CGA    Leg press x 15 x 2 with 90 lbs, Verbal cues to complete slowly and not let knees snap back. 2nd set 105lbs   Single leg press 45# x15 each side. Verbal cues to slow down movement.     Step  ups to 6 inch stool x 10. No UE suport. CGA   Eccentric step downs from 6 inch stool x 10 each LE. Visual cues for technique. Verbal cues to complete slow and tap heel.  BUE CGA    Pt educated throughout session about proper posture and technique with exercises. Improved exercise technique, movement at target joints, use of target muscles after min to mod verbal, visual, tactile cues.                  PT Education - 07/04/18 1120    Education Details  HEP    Person(s) Educated  Patient    Methods  Explanation    Comprehension  Verbalized understanding;Returned demonstration;Need further instruction       PT Short Term Goals - 06/12/18 1418      PT SHORT TERM GOAL #1   Title  Patient will be independent in home exercise program to improve strength/mobility for better functional independence with ADLs.    Time  4    Period  Weeks    Status  New    Target Date  07/03/18      PT SHORT TERM GOAL #  2   Title  Patient will increase Berg Balance score by > 6 points to demonstrate decreased fall risk during functional activities.    Time  4    Period  Weeks    Status  New    Target Date  07/03/18        PT Long Term Goals - 06/12/18 1418      PT LONG TERM GOAL #1   Title   Patient will be independent with ascend/descend 12 steps using single UE in step over step pattern without LOB.    Time  8    Period  Weeks    Status  New    Target Date  08/07/18      PT LONG TERM GOAL #2   Title  Patient will increase ABC scale score >80% to demonstrate better functional mobility and better confidence with ADLs.     Time  8    Status  New    Target Date  08/07/18      PT LONG TERM GOAL #3   Title  Patient will increase dynamic gait index score to >19/24 as to demonstrate reduced fall risk and improved dynamic gait balance for better safety with community/home ambulation.     Time  8    Period  Weeks    Status  New    Target Date  08/07/18            Plan -  07/04/18 1121    Clinical Impression Statement  Pt was able to progress dynamic balance exercises today, noting improved postural reactions with LOB and moving outside normal BOS.  Pt was able to perform all exercises on uneven surfaces with minimal LOB noted.  Single limb stability activities were added today, with decrease control noted when attempting to perform tasks with the R > L.  Pt would continue to benefit from skilled therapy services to address further balance impairments and decrease falls risk.    Rehab Potential  Good    PT Frequency  2x / week    PT Duration  8 weeks    PT Treatment/Interventions  Gait training;Therapeutic exercise;Patient/family education;Neuromuscular re-education;Balance training    PT Next Visit Plan  balance and therapeutic exericse    PT Home Exercise Plan  hip abd standing,    Consulted and Agree with Plan of Care  Patient       Patient will benefit from skilled therapeutic intervention in order to improve the following deficits and impairments:  Abnormal gait, Decreased balance, Decreased endurance, Decreased mobility, Difficulty walking, Pain, Decreased strength, Decreased activity tolerance  Visit Diagnosis: Muscle weakness (generalized)  Other abnormalities of gait and mobility  Difficulty in walking, not elsewhere classified     Problem List Patient Active Problem List   Diagnosis Date Noted  . IBS (irritable bowel syndrome) 03/09/2018  . Hypokalemia 03/09/2018  . Age-related osteoporosis without current pathological fracture 03/09/2018  . Baker's cyst of knee, right 06/06/2017  . Insomnia 03/07/2017  . Tobacco abuse 12/01/2016  . Hypertension   . Hyperlipidemia   . MDD (major depressive disorder)   . Generalized anxiety disorder     Alanson Puls, [place] DPT 07/04/2018, 11:25 AM  [place] MAIN [facility] SERVICES [address] [place], [place], [address] Phone: [phone]   Fax:   [phone]  Name: Amanda Tucker MRN: [id number] Date of Birth: Jun 21, 1945

## 2018-07-05 ENCOUNTER — Telehealth: Payer: Self-pay

## 2018-07-05 NOTE — Telephone Encounter (Signed)
Holly advised.  Apt scheduled for 07/06/2018 at 2pm   Thanks,    -Mickel Baas

## 2018-07-05 NOTE — Telephone Encounter (Signed)
Could have COVID19 infection or other infection.  If becomes very ill, may need to go to ER.  Any other symptoms?  We can set evisit for tomorrow.

## 2018-07-05 NOTE — Telephone Encounter (Signed)
Patient's daughter  Earnest Bailey called stating that she has temperature 101.0, very pale, and has chills. She would like to be advised on what to do. Her mother was feeling fine up until lunch time, then she quickly changed. Has not been around anyone sick. [phone]. Please advise.

## 2018-07-06 ENCOUNTER — Ambulatory Visit (INDEPENDENT_AMBULATORY_CARE_PROVIDER_SITE_OTHER): Payer: Medicare Other | Admitting: Family Medicine

## 2018-07-06 ENCOUNTER — Other Ambulatory Visit: Payer: Medicare Other

## 2018-07-06 ENCOUNTER — Ambulatory Visit: Payer: Medicare Other | Admitting: Physical Therapy

## 2018-07-06 ENCOUNTER — Telehealth: Payer: Self-pay | Admitting: *Deleted

## 2018-07-06 ENCOUNTER — Encounter: Payer: Self-pay | Admitting: Family Medicine

## 2018-07-06 DIAGNOSIS — R51 Headache: Secondary | ICD-10-CM | POA: Diagnosis not present

## 2018-07-06 DIAGNOSIS — R519 Headache, unspecified: Secondary | ICD-10-CM

## 2018-07-06 DIAGNOSIS — R509 Fever, unspecified: Secondary | ICD-10-CM | POA: Diagnosis not present

## 2018-07-06 DIAGNOSIS — R6889 Other general symptoms and signs: Secondary | ICD-10-CM | POA: Diagnosis not present

## 2018-07-06 DIAGNOSIS — Z20822 Contact with and (suspected) exposure to covid-19: Secondary | ICD-10-CM

## 2018-07-06 DIAGNOSIS — J3489 Other specified disorders of nose and nasal sinuses: Secondary | ICD-10-CM

## 2018-07-06 DIAGNOSIS — R110 Nausea: Secondary | ICD-10-CM

## 2018-07-06 NOTE — Progress Notes (Signed)
Patient: Amanda Tucker Female    DOB: Dec 07, 1945   73 y.o.   MRN: [id number] Visit Date: 07/06/2018  Today's Provider: [name], MD   No chief complaint on file.  Subjective:    Virtual Visit via Video Note  I connected with Shon Hough on 07/06/18 at  2:00 PM EDT by a video enabled telemedicine application and verified that I am speaking with the correct person using two identifiers.   Patient location: home Provider location: [place] involved in the visit: patient, provider   I discussed the limitations of evaluation and management by telemedicine and the availability of in person appointments. The patient expressed understanding and agreed to proceed.   HPI Patient C/O nausea, low grade fever and mild headache yesterday. She states symptoms have resolved today.   Went out to eat 2-3 days ago.  Does go to PT.  She ate lunch (leftover hamburger) around 1230.  She forgot to take her vitamins with her food, so she took it about 2 hours later.  States this often makes her nauseous.  She then developed fever.  Took ibuprofen and fever subsided.  Had loose stools twice.  Headaches, decreased appetite, runny nose.   No Known Allergies   Current Outpatient Medications:  .  alendronate (FOSAMAX) 70 MG tablet, Take 1 tablet (70 mg total) by mouth once a week. Take with a full glass of water on an empty stomach., Disp: 4 tablet, Rfl: 11 .  ALPRAZolam (XANAX) 0.5 MG tablet, TAKE 1/2 TABLET BY MOUTH TWICE DAILY AS NEEDED FOR ANXIETY, Disp: 30 tablet, Rfl: 3 .  amLODipine (NORVASC) 5 MG tablet, Take 1 tablet (5 mg total) by mouth daily., Disp: 90 tablet, Rfl: 3 .  aspirin EC 81 MG tablet, Take 81 mg daily by mouth., Disp: , Rfl:  .  aspirin-acetaminophen-caffeine (EXCEDRIN MIGRAINE) 250-250-65 MG tablet, Take 2 tablets by mouth as needed for headache., Disp: , Rfl:  .  lisinopril (PRINIVIL,ZESTRIL) 40 MG tablet, Take 1 tablet (40 mg total) by mouth  daily., Disp: 90 tablet, Rfl: 3 .  sertraline (ZOLOFT) 100 MG tablet, Take 1 tablet (100 mg total) by mouth daily., Disp: 90 tablet, Rfl: 3 .  simvastatin (ZOCOR) 20 MG tablet, TAKE 1 TABLET(20 MG) BY MOUTH DAILY AT 6 PM, Disp: 90 tablet, Rfl: 3  Review of Systems  Constitutional: Negative.   Respiratory: Negative.   Cardiovascular: Negative.   Musculoskeletal: Negative.     Social History   Tobacco Use  . Smoking status: Former Smoker    Packs/day: 0.25    Years: 54.00    Pack years: 13.50    Types: Cigarettes    Quit date: 02/08/2018    Years since quitting: 0.4  . Smokeless tobacco: Never Used  . Tobacco comment: started smoking at age 68; smoking about 3 cigarettes throughout the day  Substance Use Topics  . Alcohol use: Yes    Alcohol/week: 5.0 standard drinks    Types: 5 Glasses of wine per week      Objective:   There were no vitals taken for this visit. There were no vitals filed for this visit.   Physical Exam Constitutional:      Appearance: Normal appearance.  HENT:     Head: Normocephalic and atraumatic.  Pulmonary:     Effort: Pulmonary effort is normal. No respiratory distress.  Neurological:     Mental Status: She is alert and oriented to person, place, and time.  Mental status is at baseline.  Psychiatric:        Mood and Affect: Mood normal.        Behavior: Behavior normal.         Assessment & Plan    I discussed the assessment and treatment plan with the patient. The patient was provided an opportunity to ask questions and all were answered. The patient agreed with the plan and demonstrated an understanding of the instructions.   The patient was advised to call back or seek an in-person evaluation if the symptoms worsen or if the condition fails to improve as anticipated.  1. Fever, unspecified fever cause 2. Acute nonintractable headache, unspecified headache type 3. Nausea 4. Rhinorrhea - new problem x1-2 days - concern for possible  COVID19 infection given constellation of symptoms - will send for OP testing - discussed symptomatic management, return precautions and self quarantine for her and all household members    Return if symptoms worsen or fail to improve.   The entirety of the information documented in the History of Present Illness, Review of Systems and Physical Exam were personally obtained by me. Portions of this information were initially documented by Tiburcio Pea, CMA and reviewed by me for thoroughness and accuracy.    Tyasia Packard, [name], MD MPH [facility]

## 2018-07-06 NOTE — Telephone Encounter (Addendum)
Contacted pt to schedule testing; pt offered and accepted appointment at [facility] site 07/06/2018 at 1515; pt given address, location, and instructions that she and all occupants of her vehicle should wear masks; she verbalized understanding; orders placed per protocol.   ----- Message from [place] Crews, MD sent at 07/06/2018  2:09 PM EDT ----- Fever, rhinorrhea, headache, nausea. Elderly.  Please set up for OP COVID testing. Thanks!

## 2018-07-08 LAB — NOVEL CORONAVIRUS, NAA: SARS-CoV-2, NAA: NOT DETECTED

## 2018-07-11 ENCOUNTER — Ambulatory Visit (INDEPENDENT_AMBULATORY_CARE_PROVIDER_SITE_OTHER): Payer: Medicare Other | Admitting: Physician Assistant

## 2018-07-11 ENCOUNTER — Ambulatory Visit: Payer: Medicare Other | Admitting: Physical Therapy

## 2018-07-11 DIAGNOSIS — J0110 Acute frontal sinusitis, unspecified: Secondary | ICD-10-CM

## 2018-07-11 MED ORDER — DOXYCYCLINE HYCLATE 100 MG PO TABS: 100.0000 mg | ORAL_TABLET | Freq: Two times a day (BID) | ORAL | 0 refills | Status: AC

## 2018-07-11 NOTE — Patient Instructions (Signed)

## 2018-07-11 NOTE — Progress Notes (Signed)
Patient: Amanda Tucker Female    DOB: 09/02/1945   73 y.o.   MRN: [id number] Visit Date: 07/11/2018  Today's Provider: Trinna Post, PA-C   Chief Complaint  Patient presents with  . URI   Subjective:    I, Amanda Tucker CMA, am acting as a Education administrator for [employer], PA-C.  Virtual Visit via Video Note  I connected with Amanda Tucker on 07/11/18 at 11:20 AM EDT by a video enabled telemedicine application and verified that I am speaking with the correct person using two identifiers.  Location: Patient: Home Provider: Office   I discussed the limitations of evaluation and management by telemedicine and the availability of in person appointments. The patient expressed understanding and agreed to proceed.  URI  The current episode started in the past 7 days. The problem has been unchanged. Maximum temperature: 99.5 every afternoon. Associated symptoms include congestion, coughing, headaches (sometimes), sinus pain, sneezing, a sore throat (mornings) and wheezing. She has tried acetaminophen and increased fluids for the symptoms. The treatment provided mild relief.   Reports she had COVID testing on 07/06/2018 which was negative. Reports continued low grade temp. Reports her appetite has come back. Feels sinus pain and pressure worsening. Denies productive cough, SOB, syncope, nausea, vomiting or diarrhea.   No Known Allergies   Current Outpatient Medications:  .  alendronate (FOSAMAX) 70 MG tablet, Take 1 tablet (70 mg total) by mouth once a week. Take with a full glass of water on an empty stomach., Disp: 4 tablet, Rfl: 11 .  ALPRAZolam (XANAX) 0.5 MG tablet, TAKE 1/2 TABLET BY MOUTH TWICE DAILY AS NEEDED FOR ANXIETY, Disp: 30 tablet, Rfl: 3 .  amLODipine (NORVASC) 5 MG tablet, Take 1 tablet (5 mg total) by mouth daily., Disp: 90 tablet, Rfl: 3 .  aspirin EC 81 MG tablet, Take 81 mg daily by mouth., Disp: , Rfl:  .  aspirin-acetaminophen-caffeine (EXCEDRIN MIGRAINE)  250-250-65 MG tablet, Take 2 tablets by mouth as needed for headache., Disp: , Rfl:  .  lisinopril (PRINIVIL,ZESTRIL) 40 MG tablet, Take 1 tablet (40 mg total) by mouth daily., Disp: 90 tablet, Rfl: 3 .  sertraline (ZOLOFT) 100 MG tablet, Take 1 tablet (100 mg total) by mouth daily., Disp: 90 tablet, Rfl: 3 .  simvastatin (ZOCOR) 20 MG tablet, TAKE 1 TABLET(20 MG) BY MOUTH DAILY AT 6 PM, Disp: 90 tablet, Rfl: 3  Review of Systems  Constitutional: Positive for fever (low grade).  HENT: Positive for congestion, sinus pain, sneezing and sore throat (mornings).   Respiratory: Positive for cough and wheezing.   Genitourinary: Negative.   Neurological: Positive for headaches (sometimes).    Social History   Tobacco Use  . Smoking status: Former Smoker    Packs/day: 0.25    Years: 54.00    Pack years: 13.50    Types: Cigarettes    Quit date: 02/08/2018    Years since quitting: 0.4  . Smokeless tobacco: Never Used  . Tobacco comment: started smoking at age 80; smoking about 3 cigarettes throughout the day  Substance Use Topics  . Alcohol use: Yes    Alcohol/week: 5.0 standard drinks    Types: 5 Glasses of wine per week      Objective:   There were no vitals taken for this visit. There were no vitals filed for this visit.   Physical Exam Constitutional:      Appearance: Normal appearance.  Pulmonary:     Effort: Pulmonary effort  is normal. No respiratory distress.  Neurological:     Mental Status: She is alert and oriented to person, place, and time. Mental status is at baseline.         Assessment & Plan    1. Acute non-recurrent frontal sinusitis  Viral syndrome one week ago, negative covid. Will treat for sinusitis and cover for pneumonia as below. Offered repeat COVID testing but as she is currently isolating at home I do not think this will change treatment significantly. She appears quite well at this point but has been counseled on strict return precautions.   -  doxycycline (VIBRA-TABS) 100 MG tablet; Take 1 tablet (100 mg total) by mouth 2 (two) times daily for 7 days.  Dispense: 14 tablet; Refill: 0    I discussed the assessment and treatment plan with the patient. The patient was provided an opportunity to ask questions and all were answered. The patient agreed with the plan and demonstrated an understanding of the instructions.   The patient was advised to call back or seek an in-person evaluation if the symptoms worsen or if the condition fails to improve as anticipated.  The entirety of the information documented in the History of Present Illness, Review of Systems and Physical Exam were personally obtained by me. Portions of this information were initially documented by [facility], CMA and reviewed by me for thoroughness and accuracy.         Trinna Post, PA-C  [facility]

## 2018-07-13 ENCOUNTER — Ambulatory Visit: Payer: Medicare Other | Admitting: Physical Therapy

## 2018-07-18 ENCOUNTER — Ambulatory Visit: Payer: Medicare Other | Admitting: Physical Therapy

## 2018-07-20 ENCOUNTER — Ambulatory Visit: Payer: Medicare Other | Admitting: Physical Therapy

## 2018-07-25 ENCOUNTER — Other Ambulatory Visit: Payer: Self-pay

## 2018-07-25 ENCOUNTER — Ambulatory Visit: Payer: Medicare Other | Admitting: Physical Therapy

## 2018-07-25 ENCOUNTER — Encounter: Payer: Self-pay | Admitting: Physical Therapy

## 2018-07-25 DIAGNOSIS — R262 Difficulty in walking, not elsewhere classified: Secondary | ICD-10-CM | POA: Diagnosis not present

## 2018-07-25 DIAGNOSIS — M6281 Muscle weakness (generalized): Secondary | ICD-10-CM

## 2018-07-25 DIAGNOSIS — R2689 Other abnormalities of gait and mobility: Secondary | ICD-10-CM

## 2018-07-25 NOTE — Therapy (Signed)
[place] MAIN [facility] SERVICES [address] [place], [place], [address] Phone: [phone]   Fax:  [phone]  Physical Therapy Treatment  Patient Details  Name: Amanda Tucker MRN: [id number] Date of Birth: April 09, 1945 Referring Provider (PT): [name]   Encounter Date: 07/25/2018  PT End of Session - 07/25/18 1341    Visit Number  8    Number of Visits  17    Date for PT Re-Evaluation  08/07/18    PT Start Time  0110    PT Stop Time  0150    PT Time Calculation (min)  40 min    Equipment Utilized During Treatment  Gait belt    Activity Tolerance  Patient tolerated treatment well;Patient limited by fatigue    Behavior During Therapy  WFL for tasks assessed/performed       Past Medical History:  Diagnosis Date  . Anxiety   . Depression   . Diverticulitis   . Hyperlipidemia   . Hypertension   . Right sided sciatica     Past Surgical History:  Procedure Laterality Date  . COLONOSCOPY WITH PROPOFOL N/A 02/27/2018   Procedure: COLONOSCOPY WITH PROPOFOL;  Surgeon: Jonathon Bellows, MD;  Location: [facility] ENDOSCOPY;  Service: Gastroenterology;  Laterality: N/A;  . NO PAST SURGERIES      There were no vitals filed for this visit.  Subjective Assessment - 07/25/18 1340    Subjective  Patient is feeling off balance and weakness iin BLE. She is weak from being sick for the past few weeks.    Pertinent History  Patient was doing better after her last therapy visitis and now is feeling unsteady and weak again.she doesnt use a assistive device.    Limitations  Standing;Walking    Patient Stated Goals  to walk better    Currently in Pain?  No/denies    Pain Score  0-No pain    Pain Onset  Today         Ther-ex  Nu-step x 5 mins L 2  Standing hip abd with 2# ankle weight x 15 BLE and  BUE support  Standing hip extension with 2# ankle weight x 10 BLE with BUE support  Standing hip flexion marches with 2# ankle weight x 10 BLE with BUE  support  Standing heel raises x 15  LAQ with 2# ankle weights with 3 second holds x10 each LE  STS from chair without UE support 2x10  Mini squats x10 with no UE support  Leg press 45 lbs x 20 x 2, heel raises 30 lbs , 20 x 2   hooklying marching with 2 lbs x 20 x 2 hooklying with GTB x 20 x 2    Neuromuscular Re-education  Lunges to BOSU ball  Toe taps to 6" step without UE support alternating LE x15 each  Normal stance on Airex pad 2x60sec ?  Forward and backward stepping over hurdle x15 each direction  SIde stepping over hurdle 15x each direction    Pt educated throughout session about proper posture and technique with exercises. Improved exercise technique, movement at target joints, use of target muscles after min to mod verbal, visual, tactile cues.                     PT Education - 07/25/18 1341    Education Details  HEP    Person(s) Educated  Patient    Methods  Explanation    Comprehension  Verbalized understanding;Returned demonstration  PT Short Term Goals - 06/12/18 1418      PT SHORT TERM GOAL #1   Title  Patient will be independent in home exercise program to improve strength/mobility for better functional independence with ADLs.    Time  4    Period  Weeks    Status  New    Target Date  07/03/18      PT SHORT TERM GOAL #2   Title  Patient will increase Berg Balance score by > 6 points to demonstrate decreased fall risk during functional activities.    Time  4    Period  Weeks    Status  New    Target Date  07/03/18        PT Long Term Goals - 06/12/18 1418      PT LONG TERM GOAL #1   Title   Patient will be independent with ascend/descend 12 steps using single UE in step over step pattern without LOB.    Time  8    Period  Weeks    Status  New    Target Date  08/07/18      PT LONG TERM GOAL #2   Title  Patient will increase ABC scale score >80% to demonstrate better functional mobility and better confidence with ADLs.      Time  8    Status  New    Target Date  08/07/18      PT LONG TERM GOAL #3   Title  Patient will increase dynamic gait index score to >19/24 as to demonstrate reduced fall risk and improved dynamic gait balance for better safety with community/home ambulation.     Time  8    Period  Weeks    Status  New    Target Date  08/07/18            Plan - 07/25/18 1342    Clinical Impression Statement  Pt requires direction and verbal cues for correct performance of exercise and activities. Patient demonstrates difficulties strengthening against gravity and closed chain activities and has no reports of pain increases . Pt was able to perform all exercises with min assist and VC for technique.   Patient struggles with fatigue and repetitions.  Pt encouraged continuing HEP. Follow-up as scheduled.    Rehab Potential  Good    PT Frequency  2x / week    PT Duration  8 weeks    PT Treatment/Interventions  Gait training;Therapeutic exercise;Patient/family education;Neuromuscular re-education;Balance training    PT Next Visit Plan  balance and therapeutic exericse    PT Home Exercise Plan  hip abd standing,    Consulted and Agree with Plan of Care  Patient       Patient will benefit from skilled therapeutic intervention in order to improve the following deficits and impairments:  Abnormal gait, Decreased balance, Decreased endurance, Decreased mobility, Difficulty walking, Pain, Decreased strength, Decreased activity tolerance  Visit Diagnosis: 1. Muscle weakness (generalized)   2. Other abnormalities of gait and mobility   3. Difficulty in walking, not elsewhere classified        Problem List Patient Active Problem List   Diagnosis Date Noted  . IBS (irritable bowel syndrome) 03/09/2018  . Hypokalemia 03/09/2018  . Age-related osteoporosis without current pathological fracture 03/09/2018  . Baker's cyst of knee, right 06/06/2017  . Insomnia 03/07/2017  . Tobacco abuse 12/01/2016  .  Hypertension   . Hyperlipidemia   . MDD (major depressive disorder)   .  Generalized anxiety disorder     Alanson Puls , [place] DPT 07/25/2018, 1:44 PM  [place] MAIN [facility] SERVICES [address] [place], [place], [address] Phone: [phone]   Fax:  [phone]  Name: Amanda Tucker MRN: [id number] Date of Birth: 1945-04-10

## 2018-07-27 ENCOUNTER — Ambulatory Visit: Payer: Medicare Other | Admitting: Physical Therapy

## 2018-08-01 ENCOUNTER — Encounter: Payer: Self-pay | Admitting: Physical Therapy

## 2018-08-01 ENCOUNTER — Ambulatory Visit: Payer: Medicare Other | Attending: Family Medicine | Admitting: Physical Therapy

## 2018-08-01 ENCOUNTER — Other Ambulatory Visit: Payer: Self-pay

## 2018-08-01 DIAGNOSIS — R2689 Other abnormalities of gait and mobility: Secondary | ICD-10-CM | POA: Insufficient documentation

## 2018-08-01 DIAGNOSIS — R262 Difficulty in walking, not elsewhere classified: Secondary | ICD-10-CM | POA: Insufficient documentation

## 2018-08-01 DIAGNOSIS — M6281 Muscle weakness (generalized): Secondary | ICD-10-CM | POA: Diagnosis not present

## 2018-08-01 NOTE — Therapy (Signed)
[place] MAIN [name] SERVICES [address] [place], [place], [address] Phone: [phone]   Fax:  [phone]  Physical Therapy Treatment  Patient Details  Name: Amanda Tucker MRN: [id number] Date of Birth: [birth year] Referring Provider (PT): [name]   Encounter Date: 08/01/2018  PT End of Session - 08/01/18 1210    Visit Number  9    Number of Visits  17    Date for PT Re-Evaluation  08/07/18    PT Start Time  9379    PT Stop Time  1225    PT Time Calculation (min)  40 min    Equipment Utilized During Treatment  Gait belt    Activity Tolerance  Patient tolerated treatment well;Patient limited by fatigue    Behavior During Therapy  WFL for tasks assessed/performed       Past Medical History:  Diagnosis Date  . Anxiety   . Depression   . Diverticulitis   . Hyperlipidemia   . Hypertension   . Right sided sciatica     Past Surgical History:  Procedure Laterality Date  . COLONOSCOPY WITH PROPOFOL N/A 02/27/2018   Procedure: COLONOSCOPY WITH PROPOFOL;  Surgeon: Jonathon Bellows, MD;  Location: [facility] ENDOSCOPY;  Service: Gastroenterology;  Laterality: N/A;  . NO PAST SURGERIES      There were no vitals filed for this visit.  Subjective Assessment - 08/01/18 1207    Subjective  Patient is feeling off balance and weakness iin BLE. She is weak from being sick for the past few weeks.    Pertinent History  Patient was doing better after her last therapy visitis and now is feeling unsteady and weak again.she doesnt use a assistive device.    Limitations  Standing;Walking    Patient Stated Goals  to walk better    Pain Onset  Today      Treatment:  Heel raises x20 reps bilaterally, BUE support in // bars, supervision for safety Marching x15 reps each leg, BUE support in // bars, supervision for safety Step ups to 4 " stool x 20  Eccentric step downs from 4 inch stool x 20  VCs for proper technique and positioning for each exercise   Rockerboard Lateral weight shift x10 reps each direction, no UE support, CGA for safety with VCs to control the board tap in each direction and not letting it hit too hard  AP weight shift x10 reps, no UE support, CGA for safety with VCs to utilize ankles to transfer weight over toes and back over heels without just leaning the trunk  leg press x 45 lbs x 20 x 3  Pt educated throughout session about proper posture and technique with exercises. Improved exercise technique, movement at target joints, use of target muscles after min to mod verbal, visual, tactile cues.                     PT Education - 08/01/18 1208    Education Details  HEP    Person(s) Educated  Patient    Methods  Explanation    Comprehension  Verbalized understanding       PT Short Term Goals - 06/12/18 1418      PT SHORT TERM GOAL #1   Title  Patient will be independent in home exercise program to improve strength/mobility for better functional independence with ADLs.    Time  4    Period  Weeks    Status  New  Target Date  07/03/18      PT SHORT TERM GOAL #2   Title  Patient will increase Berg Balance score by > 6 points to demonstrate decreased fall risk during functional activities.    Time  4    Period  Weeks    Status  New    Target Date  07/03/18        PT Long Term Goals - 06/12/18 1418      PT LONG TERM GOAL #1   Title   Patient will be independent with ascend/descend 12 steps using single UE in step over step pattern without LOB.    Time  8    Period  Weeks    Status  New    Target Date  08/07/18      PT LONG TERM GOAL #2   Title  Patient will increase ABC scale score >80% to demonstrate better functional mobility and better confidence with ADLs.     Time  8    Status  New    Target Date  08/07/18      PT LONG TERM GOAL #3   Title  Patient will increase dynamic gait index score to >19/24 as to demonstrate reduced fall risk and improved dynamic gait balance for  better safety with community/home ambulation.     Time  8    Period  Weeks    Status  New    Target Date  08/07/18            Plan - 08/01/18 1210    Clinical Impression Statement  Patient demonstrates improved stability and strength allowing patient to perform intermediate duration standing interventions with rest periods.  Patient performs beginning standing dynamic standing balance exercises to shift weight and perform single leg standing activities with mod assist. Patient fatigues quickly with exercises requiring rest breaks at this time. Patient will continue to benefit from skilled physical therapy to improve pain and mobility.       Patient will benefit from skilled therapeutic intervention in order to improve the following deficits and impairments:     Visit Diagnosis: 1. Muscle weakness (generalized)   2. Other abnormalities of gait and mobility   3. Difficulty in walking, not elsewhere classified        Problem List Patient Active Problem List   Diagnosis Date Noted  . IBS (irritable bowel syndrome) 03/09/2018  . Hypokalemia 03/09/2018  . Age-related osteoporosis without current pathological fracture 03/09/2018  . Baker's cyst of knee, right 06/06/2017  . Insomnia 03/07/2017  . Tobacco abuse 12/01/2016  . Hypertension   . Hyperlipidemia   . MDD (major depressive disorder)   . Generalized anxiety disorder     Alanson Puls, [place] DPT 08/01/2018, 12:12 PM  [place] [address] [place], [place], [address] Phone: [phone]   Fax:  [phone]  Name: Amanda Tucker MRN: [id number] Date of Birth: [birth year]

## 2018-08-03 ENCOUNTER — Ambulatory Visit: Payer: Medicare Other | Admitting: Physical Therapy

## 2018-08-08 ENCOUNTER — Encounter: Payer: Self-pay | Admitting: Physical Therapy

## 2018-08-08 ENCOUNTER — Ambulatory Visit: Payer: Medicare Other | Admitting: Physical Therapy

## 2018-08-08 ENCOUNTER — Other Ambulatory Visit: Payer: Self-pay

## 2018-08-08 DIAGNOSIS — R262 Difficulty in walking, not elsewhere classified: Secondary | ICD-10-CM

## 2018-08-08 DIAGNOSIS — R2689 Other abnormalities of gait and mobility: Secondary | ICD-10-CM | POA: Diagnosis not present

## 2018-08-08 DIAGNOSIS — M6281 Muscle weakness (generalized): Secondary | ICD-10-CM | POA: Diagnosis not present

## 2018-08-08 NOTE — Therapy (Signed)
[place] MAIN [facility] SERVICES [address] [place], [place], [address] Phone: [phone]   Fax:  [phone]  Physical Therapy Treatment Physical Therapy Progress Note   Dates of reporting period  06/12/18   to   08/08/18  Patient Details  Name: Amanda Tucker MRN: [id number] Date of Birth: 07-Oct-1945 Referring Provider (PT): [name]   Encounter Date: 08/08/2018  PT End of Session - 08/08/18 1502    Visit Number  10    Number of Visits  17    Date for PT Re-Evaluation  08/07/18    PT Start Time  0230    PT Stop Time  0315    PT Time Calculation (min)  45 min    Equipment Utilized During Treatment  Gait belt    Activity Tolerance  Patient tolerated treatment well;Patient limited by fatigue    Behavior During Therapy  WFL for tasks assessed/performed       Past Medical History:  Diagnosis Date  . Anxiety   . Depression   . Diverticulitis   . Hyperlipidemia   . Hypertension   . Right sided sciatica     Past Surgical History:  Procedure Laterality Date  . COLONOSCOPY WITH PROPOFOL N/A 02/27/2018   Procedure: COLONOSCOPY WITH PROPOFOL;  Surgeon: Jonathon Bellows, MD;  Location: [facility] ENDOSCOPY;  Service: Gastroenterology;  Laterality: N/A;  . NO PAST SURGERIES      There were no vitals filed for this visit.  Subjective Assessment - 08/08/18 1502    Subjective  Patient is feeling off balance and weakness iin BLE. She is weak from being sick for the past few weeks.    Pertinent History  Patient was doing better after her last therapy visitis and now is feeling unsteady and weak again.she doesnt use a assistive device.    Limitations  Standing;Walking    Patient Stated Goals  to walk better    Currently in Pain?  No/denies    Pain Score  0-No pain    Pain Onset  Today      Treatment: OUTCOME MEASURES: TEST Outcome Interpretation  5 times sit<>stand 15.68sec >24 yo, >15 sec indicates increased risk for falls  10 meter walk  test      1.2           m/s <1.0 m/s indicates increased risk for falls; limited community ambulator  Timed up and Go  11.00             sec <14 sec indicates increased risk for falls  6 minute walk test   825            Feet 1000 feet is community Conservator, museum/gallery Assessment 45/56 <36/56 (100% risk for falls), 37-45 (80% risk for falls); 46-51 (>50% risk for falls); 52-55 (lower risk <25% of falls)  DGI  18/24 24 / 24 is normal     Reviewed goals and updated them.   Ther-ex  NuStep L2 x 5 minutes  Standing marching with 2# ankle weights x 15; Standing hip extension with 2# ankle weights x 15; Standing hip abduction with 2# ankle weights x 15;  Standing HS curls 2# ankle weights x 15;                     PT Education - 08/08/18 1502    Education Details  HEP    Person(s) Educated  Patient    Methods  Explanation    Comprehension  Verbalized understanding       PT Short Term Goals - 08/08/18 1503      PT SHORT TERM GOAL #1   Title  Patient will be independent in home exercise program to improve strength/mobility for better functional independence with ADLs.    Time  4    Period  Weeks    Status  Achieved    Target Date  07/03/18      PT SHORT TERM GOAL #2   Title  Patient will increase Berg Balance score by > 6 points to demonstrate decreased fall risk during functional activities.    Time  4    Period  Weeks    Status  Partially Met    Target Date  07/03/18        PT Long Term Goals - 08/08/18 1512      PT LONG TERM GOAL #1   Title   Patient will be independent with ascend/descend 12 steps using single UE in step over step pattern without LOB.    Baseline  needs BUE railings for 12 steps    Time  8    Period  Weeks    Status  Partially Met    Target Date  10/03/18      PT LONG TERM GOAL #2   Title  Patient will increase ABC scale score >80% to demonstrate better functional mobility and better confidence with ADLs.     Time  8     Status  Partially Met    Target Date  10/03/18      PT LONG TERM GOAL #3   Title  Patient will increase dynamic gait index score to >19/24 as to demonstrate reduced fall risk and improved dynamic gait balance for better safety with community/home ambulation.     Time  8    Period  Weeks    Status  Partially Met    Target Date  10/03/18            Plan - 08/08/18 1550    Clinical Impression Statement Patient's condition has the potential to improve in response to therapy. Maximum improvement is yet to be obtained. The anticipated improvement is attainable and reasonable in a generally predictable time.  Patient reports that she feels weak and has good and bad days.  Patient is having decreased strength in BLE and decreased dynamic standing balance and is making progress towards all goals. She will continue to benefit from skilled PT to improve strength and mobility.    Rehab Potential  Good    PT Frequency  2x / week    PT Duration  8 weeks    PT Treatment/Interventions  Gait training;Therapeutic exercise;Patient/family education;Neuromuscular re-education;Balance training    PT Next Visit Plan  balance and therapeutic exericse    PT Home Exercise Plan  hip abd standing,    Consulted and Agree with Plan of Care  Patient       Patient will benefit from skilled therapeutic intervention in order to improve the following deficits and impairments:  Abnormal gait, Decreased balance, Decreased endurance, Decreased mobility, Difficulty walking, Pain, Decreased strength, Decreased activity tolerance  Visit Diagnosis: 1. Muscle weakness (generalized)   2. Other abnormalities of gait and mobility   3. Difficulty in walking, not elsewhere classified        Problem List Patient Active Problem List   Diagnosis Date Noted  . IBS (irritable bowel syndrome) 03/09/2018  . Hypokalemia 03/09/2018  . Age-related osteoporosis without current  pathological fracture 03/09/2018  . Baker's cyst of  knee, right 06/06/2017  . Insomnia 03/07/2017  . Tobacco abuse 12/01/2016  . Hypertension   . Hyperlipidemia   . MDD (major depressive disorder)   . Generalized anxiety disorder     Alanson Puls, [place] DPT 08/08/2018, 3:55 PM  [place] MAIN [facility] SERVICES [address] [place], [place], [address] Phone: [phone]   Fax:  [phone]  Name: Amanda Tucker MRN: [id number] Date of Birth: Dec 27, 1945

## 2018-08-10 ENCOUNTER — Ambulatory Visit: Payer: Medicare Other | Admitting: Physical Therapy

## 2018-08-15 ENCOUNTER — Ambulatory Visit: Payer: Medicare Other | Admitting: Physical Therapy

## 2018-08-15 ENCOUNTER — Encounter: Payer: Self-pay | Admitting: Physical Therapy

## 2018-08-15 ENCOUNTER — Other Ambulatory Visit: Payer: Self-pay

## 2018-08-15 DIAGNOSIS — R262 Difficulty in walking, not elsewhere classified: Secondary | ICD-10-CM

## 2018-08-15 DIAGNOSIS — M6281 Muscle weakness (generalized): Secondary | ICD-10-CM

## 2018-08-15 DIAGNOSIS — R2689 Other abnormalities of gait and mobility: Secondary | ICD-10-CM | POA: Diagnosis not present

## 2018-08-15 NOTE — Therapy (Signed)
[place] MAIN [facility] SERVICES [address] [place], [place], [address] Phone: [phone]   Fax:  [phone]  Physical Therapy Treatment  Patient Details  Name: Amanda Tucker MRN: [id number] Date of Birth: 1945/03/06 Referring Provider (PT): [name]   Encounter Date: 08/15/2018  PT End of Session - 08/15/18 1603    Visit Number  11    Number of Visits  17    Date for PT Re-Evaluation  08/07/18    PT Start Time  0330    PT Stop Time  0415    PT Time Calculation (min)  45 min    Equipment Utilized During Treatment  Gait belt    Activity Tolerance  Patient tolerated treatment well;Patient limited by fatigue    Behavior During Therapy  WFL for tasks assessed/performed       Past Medical History:  Diagnosis Date  . Anxiety   . Depression   . Diverticulitis   . Hyperlipidemia   . Hypertension   . Right sided sciatica     Past Surgical History:  Procedure Laterality Date  . COLONOSCOPY WITH PROPOFOL N/A 02/27/2018   Procedure: COLONOSCOPY WITH PROPOFOL;  Surgeon: Jonathon Bellows, MD;  Location: [facility] ENDOSCOPY;  Service: Gastroenterology;  Laterality: N/A;  . NO PAST SURGERIES      There were no vitals filed for this visit.  Subjective Assessment - 08/15/18 1552    Subjective  Patient is feeling off balance and weakness iin BLE.She has no new concerns.    Pertinent History  Patient was doing better after her last therapy visitis and now is feeling unsteady and weak again.she doesnt use a assistive device.    Limitations  Standing;Walking    Patient Stated Goals  to walk better    Currently in Pain?  No/denies    Pain Score  0-No pain    Pain Onset  Today        Ther-ex  Octane fitness L4 x 5 mins Quantum single leg press RLE 60# x 20 x 3  Squats on firm ground x12 SLR x 20 x 2 Bridges x 20  sidelying hip abd x 20 x 2      Neuromuscular Re-education  Squats on foam with CGA 2x12 Backwards lunges attempted from BOSU  and dynadisc x2 each, with difficulty Forwards lunges onto dynadisc x12 bilaterally.  Demonstrates tendency to rotate hips.  CGA to minA for steadying. Step ups on 5" step and airex pad with head turns with RUE support on II bars. x10 bilaterally.     Pt educated throughout session about proper posture and technique with exercises. Improved exercise technique, movement at target joints, use of target muscles after min to mod verbal, visual, tactile cues.                         PT Education - 08/15/18 1553    Education Details  HEP    Person(s) Educated  Patient    Methods  Explanation    Comprehension  Verbalized understanding       PT Short Term Goals - 08/08/18 1503      PT SHORT TERM GOAL #1   Title  Patient will be independent in home exercise program to improve strength/mobility for better functional independence with ADLs.    Time  4    Period  Weeks    Status  Achieved    Target Date  07/03/18  PT SHORT TERM GOAL #2   Title  Patient will increase Berg Balance score by > 6 points to demonstrate decreased fall risk during functional activities.    Time  4    Period  Weeks    Status  Partially Met    Target Date  07/03/18        PT Long Term Goals - 08/08/18 1512      PT LONG TERM GOAL #1   Title   Patient will be independent with ascend/descend 12 steps using single UE in step over step pattern without LOB.    Baseline  needs BUE railings for 12 steps    Time  8    Period  Weeks    Status  Partially Met    Target Date  10/03/18      PT LONG TERM GOAL #2   Title  Patient will increase ABC scale score >80% to demonstrate better functional mobility and better confidence with ADLs.     Time  8    Status  Partially Met    Target Date  10/03/18      PT LONG TERM GOAL #3   Title  Patient will increase dynamic gait index score to >19/24 as to demonstrate reduced fall risk and improved dynamic gait balance for better safety with community/home  ambulation.     Time  8    Period  Weeks    Status  Partially Met    Target Date  10/03/18            Plan - 08/15/18 1604    Clinical Impression Statement  Patient demonstrates improved stability and strength allowing patient to perform short duration standing interventions with rest periods.  Patient performs beginning standing dynamic standing balance exercises to shift weight and perform single leg standing activities with mod assist. Patient fatigues quickly with exercises requiring rest breaks at this time. Patient will continue to benefit from skilled physical therapy to improve pain and mobility.    Rehab Potential  Good    PT Frequency  2x / week    PT Duration  8 weeks    PT Treatment/Interventions  Gait training;Therapeutic exercise;Patient/family education;Neuromuscular re-education;Balance training    PT Next Visit Plan  balance and therapeutic exericse    PT Home Exercise Plan  hip abd standing,    Consulted and Agree with Plan of Care  Patient       Patient will benefit from skilled therapeutic intervention in order to improve the following deficits and impairments:  Abnormal gait, Decreased balance, Decreased endurance, Decreased mobility, Difficulty walking, Pain, Decreased strength, Decreased activity tolerance  Visit Diagnosis: 1. Muscle weakness (generalized)   2. Other abnormalities of gait and mobility   3. Difficulty in walking, not elsewhere classified        Problem List Patient Active Problem List   Diagnosis Date Noted  . IBS (irritable bowel syndrome) 03/09/2018  . Hypokalemia 03/09/2018  . Age-related osteoporosis without current pathological fracture 03/09/2018  . Baker's cyst of knee, right 06/06/2017  . Insomnia 03/07/2017  . Tobacco abuse 12/01/2016  . Hypertension   . Hyperlipidemia   . MDD (major depressive disorder)   . Generalized anxiety disorder     Alanson Puls, [place] DPT 08/15/2018, 4:06 PM  [place] MAIN [facility] SERVICES [address] [place], [place], [address] Phone: [phone]   Fax:  [phone]  Name: Shar Paez MRN: [id number] Date of Birth: Apr 17, 1945

## 2018-08-17 ENCOUNTER — Ambulatory Visit: Payer: Medicare Other | Admitting: Physical Therapy

## 2018-08-17 ENCOUNTER — Other Ambulatory Visit: Payer: Self-pay

## 2018-08-17 DIAGNOSIS — R262 Difficulty in walking, not elsewhere classified: Secondary | ICD-10-CM | POA: Diagnosis not present

## 2018-08-17 DIAGNOSIS — R2689 Other abnormalities of gait and mobility: Secondary | ICD-10-CM

## 2018-08-17 DIAGNOSIS — M6281 Muscle weakness (generalized): Secondary | ICD-10-CM | POA: Diagnosis not present

## 2018-08-17 NOTE — Therapy (Signed)
[place] MAIN [facility] SERVICES [address] [place], [place], [address] Phone: [phone]   Fax:  [phone]  Physical Therapy Treatment  Patient Details  Name: Amanda Tucker MRN: [id number] Date of Birth: February 22, 1945 Referring Provider (PT): [name]   Encounter Date: 08/17/2018  PT End of Session - 08/17/18 1107    Visit Number  12    Number of Visits  17    Date for PT Re-Evaluation  08/07/18    PT Start Time  1100    PT Stop Time  1140    PT Time Calculation (min)  40 min    Equipment Utilized During Treatment  Gait belt    Activity Tolerance  Patient tolerated treatment well;Patient limited by fatigue    Behavior During Therapy  [facility] for tasks assessed/performed       Past Medical History:  Diagnosis Date  . Anxiety   . Depression   . Diverticulitis   . Hyperlipidemia   . Hypertension   . Right sided sciatica     Past Surgical History:  Procedure Laterality Date  . COLONOSCOPY WITH PROPOFOL N/A 02/27/2018   Procedure: COLONOSCOPY WITH PROPOFOL;  Surgeon: Jonathon Bellows, MD;  Location: [facility] ENDOSCOPY;  Service: Gastroenterology;  Laterality: N/A;  . NO PAST SURGERIES      There were no vitals filed for this visit.  Subjective Assessment - 08/17/18 1107    Subjective  Patient is feeling off balance and weakness iin BLE.She has no new concerns.    Pertinent History  Patient was doing better after her last therapy visitis and now is feeling unsteady and weak again.she doesnt use a assistive device.    Limitations  Standing;Walking    Patient Stated Goals  to walk better    Pain Onset  Today      Treatment: Seated with green tband around BLE: hip flexion march x15 bilaterally; hip abduction x15 bilaterally; LAQ x15 bilaterally;ankle DF x15 bilaterally; Patient required min-moderate verbal/tactile cues for correct exercise technique with cues for tband placement and to increase ROM for better strengthening;     Standing in parallel bars: Standing on airex: toe taps to 4 inch step with 2-1 rail assist x15 reps bilaterally; standing one foot on step, Alternate BUE ball pass side/side x5 reps each foot on step; mini squat unsupported x10 reps; heel/toe raises x15 reps with B rail assist for balance; Alternate march x15 bilaterally with cues to increase hip flexion for better ROM/strength; staggered stance, head turns side/side, up/down x5 reps each, each foot in front;    Patient tolerated session well; denies any pain or fatigue at end of session                        PT Education - 08/17/18 1107    Education Details  HEP    Person(s) Educated  Patient    Methods  Explanation    Comprehension  Verbalized understanding;Tactile cues required       PT Short Term Goals - 08/08/18 1503      PT SHORT TERM GOAL #1   Title  Patient will be independent in home exercise program to improve strength/mobility for better functional independence with ADLs.    Time  4    Period  Weeks    Status  Achieved    Target Date  07/03/18      PT SHORT TERM GOAL #2   Title  Patient will increase Merrilee Jansky  Balance score by > 6 points to demonstrate decreased fall risk during functional activities.    Time  4    Period  Weeks    Status  Partially Met    Target Date  07/03/18        PT Long Term Goals - 08/08/18 1512      PT LONG TERM GOAL #1   Title   Patient will be independent with ascend/descend 12 steps using single UE in step over step pattern without LOB.    Baseline  needs BUE railings for 12 steps    Time  8    Period  Weeks    Status  Partially Met    Target Date  10/03/18      PT LONG TERM GOAL #2   Title  Patient will increase ABC scale score >80% to demonstrate better functional mobility and better confidence with ADLs.     Time  8    Status  Partially Met    Target Date  10/03/18      PT LONG TERM GOAL #3   Title  Patient will increase dynamic gait index score to  >19/24 as to demonstrate reduced fall risk and improved dynamic gait balance for better safety with community/home ambulation.     Time  8    Period  Weeks    Status  Partially Met    Target Date  10/03/18            Plan - 08/17/18 1109    Clinical Impression Statement  Pt. demonstrates ability to maintain static balance on airex utilizing increased ankle strategy. Pt. Requires UE support in tandem stance with LLE as rear stance leg to stabilize when LOB. Pt. has increased difficulty with EC airex standing with  head turns using minimal hip strategy with posterior weight displacement and occasionally finger touch to bar to stabilize Pt. LOB during side stepping with vertical head turns able to self-recover with step posteriorly    Rehab Potential  Good    PT Frequency  2x / week    PT Duration  8 weeks    PT Treatment/Interventions  Gait training;Therapeutic exercise;Patient/family education;Neuromuscular re-education;Balance training    PT Next Visit Plan  balance and therapeutic exericse    PT Home Exercise Plan  hip abd standing,    Consulted and Agree with Plan of Care  Patient       Patient will benefit from skilled therapeutic intervention in order to improve the following deficits and impairments:  Abnormal gait, Decreased balance, Decreased endurance, Decreased mobility, Difficulty walking, Pain, Decreased strength, Decreased activity tolerance  Visit Diagnosis: 1. Muscle weakness (generalized)   2. Other abnormalities of gait and mobility   3. Difficulty in walking, not elsewhere classified        Problem List Patient Active Problem List   Diagnosis Date Noted  . IBS (irritable bowel syndrome) 03/09/2018  . Hypokalemia 03/09/2018  . Age-related osteoporosis without current pathological fracture 03/09/2018  . Baker's cyst of knee, right 06/06/2017  . Insomnia 03/07/2017  . Tobacco abuse 12/01/2016  . Hypertension   . Hyperlipidemia   . MDD (major depressive  disorder)   . Generalized anxiety disorder     Alanson Puls, [place] DPT 08/17/2018, 11:14 AM  [place] MAIN [facility] SERVICES [address] [place], [place], [address] Phone: [phone]   Fax:  [phone]  Name: Amanda Tucker MRN: [id number] Date of Birth: Dec 07, 1945

## 2018-08-22 ENCOUNTER — Ambulatory Visit: Payer: Medicare Other | Admitting: Physical Therapy

## 2018-08-22 ENCOUNTER — Encounter: Payer: Self-pay | Admitting: Physical Therapy

## 2018-08-22 ENCOUNTER — Other Ambulatory Visit: Payer: Self-pay

## 2018-08-22 DIAGNOSIS — R2689 Other abnormalities of gait and mobility: Secondary | ICD-10-CM

## 2018-08-22 DIAGNOSIS — R262 Difficulty in walking, not elsewhere classified: Secondary | ICD-10-CM | POA: Diagnosis not present

## 2018-08-22 DIAGNOSIS — M6281 Muscle weakness (generalized): Secondary | ICD-10-CM | POA: Diagnosis not present

## 2018-08-22 NOTE — Therapy (Signed)
[place] MAIN [facility] SERVICES [address] [place], [place], [address] Phone: [phone]   Fax:  [phone]  Physical Therapy Treatment  Patient Details  Name: Amanda Tucker MRN: [id number] Date of Birth: [birth year] Referring Provider (PT): [name]   Encounter Date: 08/22/2018  PT End of Session - 08/22/18 1115    Visit Number  13    Number of Visits  17    Date for PT Re-Evaluation  08/07/18    PT Start Time  1100    PT Stop Time  1145    PT Time Calculation (min)  45 min    Equipment Utilized During Treatment  Gait belt    Activity Tolerance  Patient tolerated treatment well;Patient limited by fatigue    Behavior During Therapy  [facility] for tasks assessed/performed       Past Medical History:  Diagnosis Date  . Anxiety   . Depression   . Diverticulitis   . Hyperlipidemia   . Hypertension   . Right sided sciatica     Past Surgical History:  Procedure Laterality Date  . COLONOSCOPY WITH PROPOFOL N/A 02/27/2018   Procedure: COLONOSCOPY WITH PROPOFOL;  Surgeon: Jonathon Bellows, MD;  Location: [facility] ENDOSCOPY;  Service: Gastroenterology;  Laterality: N/A;  . NO PAST SURGERIES      There were no vitals filed for this visit.  Subjective Assessment - 08/22/18 1114    Subjective  Patient is feeling off balance and weakness iin BLE.She has no new concerns.    Pertinent History  Patient was doing better after her last therapy visitis and now is feeling unsteady and weak again.she doesnt use a assistive device.    Limitations  Standing;Walking    Patient Stated Goals  to walk better    Currently in Pain?  No/denies    Pain Score  0-No pain    Pain Onset  Today        Treatment; Squats on foam x 10 x 2   Heel raises x 20 x 2 sets     Resisted walking forward/backward 2.5 lbs x10. CGA   set 105lbs   Single leg press 35# x15 each side. Verbal cues to slow down movement.     Step ups to 6 inch stool x 10. No UE suport. CGA    Eccentric step downs from 6 inch stool x 10 each LE. Visual cues for technique. Verbal cues to complete slow and tap heel.  BUE CGA   Standing on airex pad taps on 6in step. No UE support  tandem standing and modified tandem standing on flat surfaces and on foam with UE support and head turns  Side stepping on air balance beam and CGA with posterior lean and many self corrections  CGA for safety and cues to improve ankle strategies for better stance control                    PT Education - 08/22/18 1114    Education Details  hep    Person(s) Educated  Patient    Methods  Explanation    Comprehension  Verbalized understanding;Returned demonstration;Verbal cues required       PT Short Term Goals - 08/08/18 1503      PT SHORT TERM GOAL #1   Title  Patient will be independent in home exercise program to improve strength/mobility for better functional independence with ADLs.    Time  4    Period  Weeks  Status  Achieved    Target Date  07/03/18      PT SHORT TERM GOAL #2   Title  Patient will increase Berg Balance score by > 6 points to demonstrate decreased fall risk during functional activities.    Time  4    Period  Weeks    Status  Partially Met    Target Date  07/03/18        PT Long Term Goals - 08/08/18 1512      PT LONG TERM GOAL #1   Title   Patient will be independent with ascend/descend 12 steps using single UE in step over step pattern without LOB.    Baseline  needs BUE railings for 12 steps    Time  8    Period  Weeks    Status  Partially Met    Target Date  10/03/18      PT LONG TERM GOAL #2   Title  Patient will increase ABC scale score >80% to demonstrate better functional mobility and better confidence with ADLs.     Time  8    Status  Partially Met    Target Date  10/03/18      PT LONG TERM GOAL #3   Title  Patient will increase dynamic gait index score to >19/24 as to demonstrate reduced fall risk and improved dynamic gait  balance for better safety with community/home ambulation.     Time  8    Period  Weeks    Status  Partially Met    Target Date  10/03/18            Plan - 08/22/18 1130    Clinical Impression Statement  Pt demonstrates excellent motivation with therapy today.Initiated HEP to include sit to stand without UE support as well as semitandem balance progressing to full tandem balance. Pt will benefit from PT services to address deficits in strength, balance, and mobility in order to return to full function at home.    Rehab Potential  Good    PT Frequency  2x / week    PT Duration  8 weeks    PT Treatment/Interventions  Gait training;Therapeutic exercise;Patient/family education;Neuromuscular re-education;Balance training    PT Next Visit Plan  balance and therapeutic exericse    PT Home Exercise Plan  hip abd standing,    Consulted and Agree with Plan of Care  Patient       Patient will benefit from skilled therapeutic intervention in order to improve the following deficits and impairments:  Abnormal gait, Decreased balance, Decreased endurance, Decreased mobility, Difficulty walking, Pain, Decreased strength, Decreased activity tolerance  Visit Diagnosis: 1. Other abnormalities of gait and mobility   2. Muscle weakness (generalized)   3. Difficulty in walking, not elsewhere classified        Problem List Patient Active Problem List   Diagnosis Date Noted  . IBS (irritable bowel syndrome) 03/09/2018  . Hypokalemia 03/09/2018  . Age-related osteoporosis without current pathological fracture 03/09/2018  . Baker's cyst of knee, right 06/06/2017  . Insomnia 03/07/2017  . Tobacco abuse 12/01/2016  . Hypertension   . Hyperlipidemia   . MDD (major depressive disorder)   . Generalized anxiety disorder     Alanson Puls, [place] DPT 08/22/2018, 11:49 AM  [place] MAIN [facility] SERVICES [address] [place], [place], [address] Phone:  [phone]   Fax:  [phone]  Name: Amanda Tucker MRN: [id number] Date of Birth: 04/23/1945

## 2018-08-23 ENCOUNTER — Encounter: Payer: Self-pay | Admitting: *Deleted

## 2018-08-24 ENCOUNTER — Encounter: Payer: Self-pay | Admitting: Physical Therapy

## 2018-08-24 ENCOUNTER — Other Ambulatory Visit: Payer: Self-pay

## 2018-08-24 ENCOUNTER — Ambulatory Visit: Payer: Medicare Other | Admitting: Physical Therapy

## 2018-08-24 DIAGNOSIS — M6281 Muscle weakness (generalized): Secondary | ICD-10-CM | POA: Diagnosis not present

## 2018-08-24 DIAGNOSIS — H2511 Age-related nuclear cataract, right eye: Secondary | ICD-10-CM | POA: Diagnosis not present

## 2018-08-24 DIAGNOSIS — R2689 Other abnormalities of gait and mobility: Secondary | ICD-10-CM

## 2018-08-24 DIAGNOSIS — I10 Essential (primary) hypertension: Secondary | ICD-10-CM | POA: Diagnosis not present

## 2018-08-24 DIAGNOSIS — R262 Difficulty in walking, not elsewhere classified: Secondary | ICD-10-CM | POA: Diagnosis not present

## 2018-08-24 NOTE — Therapy (Signed)
[place] MAIN [facility] SERVICES [address] [place], [place], [address] Phone: [phone]   Fax:  [phone]  Physical Therapy Treatment  Patient Details  Name: Amanda Tucker MRN: [id number] Date of Birth: 12/12/1945 Referring Provider (PT): [name]   Encounter Date: 08/24/2018  PT End of Session - 08/24/18 1101    Visit Number  14    Number of Visits  17    Date for PT Re-Evaluation  08/07/18    PT Start Time  1058    PT Stop Time  1140    PT Time Calculation (min)  42 min    Equipment Utilized During Treatment  Gait belt    Activity Tolerance  Patient tolerated treatment well;Patient limited by fatigue    Behavior During Therapy  [facility] for tasks assessed/performed       Past Medical History:  Diagnosis Date  . Anxiety   . Arthritis   . Balance problem   . Depression   . Diabetes mellitus without complication ([place])   . Diverticulitis   . Environmental and seasonal allergies   . GERD (gastroesophageal reflux disease)   . HOH (hard of hearing)   . Hyperlipidemia   . Hypertension   . Osteoporosis   . Right sided sciatica   . Vertigo     Past Surgical History:  Procedure Laterality Date  . COLONOSCOPY WITH PROPOFOL N/A 02/27/2018   Procedure: COLONOSCOPY WITH PROPOFOL;  Surgeon: Jonathon Bellows, MD;  Location: [facility] ENDOSCOPY;  Service: Gastroenterology;  Laterality: N/A;  . NO PAST SURGERIES      There were no vitals filed for this visit.  Subjective Assessment - 08/24/18 1100    Subjective  Patient is feeling off balance and weakness iin BLE.She has no new concerns.    Pertinent History  Patient was doing better after her last therapy visitis and now is feeling unsteady and weak again.she doesnt use a assistive device.    Limitations  Standing;Walking    Patient Stated Goals  to walk better    Currently in Pain?  Yes    Pain Score  2     Pain Location  Knee    Pain Orientation  Right    Pain Descriptors / Indicators   Aching    Pain Onset  Today       Treatment:   Airex pad  Hold ball and trunk rotation   x2 min, CGA for safety, demonstrated difficulty with trunk rotation and keeping arms extended  Airex pad,   x2 min, supervision for safety with varying directions and speed of balloon, VCs for utilizing both hands and minimizing UE support  Step over hurdle fwd/ bwd, side to side , CGA for safety, VCs to take big enough steps and to try to increase speed to work on coordination   Side stepping x10 on blue balance CGA for safety, VCs for taking a big enough step   Step ups from foam to 6 inch stool x 20   Lateral step ups from foam to 6 inch stool x 20   Bridges x 10 x 2   hooklying marching with 2 lbs LLE, 0 lbs RLE x 20 x 2   SLR x 10 x 2 BLE   Left knee ice x 10 mins to reduce pain   Leg press x 35  lbs x 20 x 2   Patient required min VCs for balance stability, including to increase trunk control for less loss of balance  with smaller base of support                           PT Education - 08/24/18 1101    Education Details  HEP    Person(s) Educated  Patient    Methods  Explanation    Comprehension  Verbalized understanding;Tactile cues required       PT Short Term Goals - 08/08/18 1503      PT SHORT TERM GOAL #1   Title  Patient will be independent in home exercise program to improve strength/mobility for better functional independence with ADLs.    Time  4    Period  Weeks    Status  Achieved    Target Date  07/03/18      PT SHORT TERM GOAL #2   Title  Patient will increase Berg Balance score by > 6 points to demonstrate decreased fall risk during functional activities.    Time  4    Period  Weeks    Status  Partially Met    Target Date  07/03/18        PT Long Term Goals - 08/08/18 1512      PT LONG TERM GOAL #1   Title   Patient will be independent with ascend/descend 12 steps using single UE in step over step pattern without LOB.     Baseline  needs BUE railings for 12 steps    Time  8    Period  Weeks    Status  Partially Met    Target Date  10/03/18      PT LONG TERM GOAL #2   Title  Patient will increase ABC scale score >80% to demonstrate better functional mobility and better confidence with ADLs.     Time  8    Status  Partially Met    Target Date  10/03/18      PT LONG TERM GOAL #3   Title  Patient will increase dynamic gait index score to >19/24 as to demonstrate reduced fall risk and improved dynamic gait balance for better safety with community/home ambulation.     Time  8    Period  Weeks    Status  Partially Met    Target Date  10/03/18            Plan - 08/24/18 1102    Clinical Impression Statement  Patient demonstrates weakness in BLE hips and knees and has unstable balance on uneven surfaces. Patient challenged closed chain and open chain exercises with multiple repetitions due to fatigue. Weak LE combined with weak core musculature results in fair postural control and minimal balance deficits. Patient will benefit from continued skilled PT to improve mobility and safety.    Rehab Potential  Good    PT Frequency  2x / week    PT Duration  8 weeks    PT Treatment/Interventions  Gait training;Therapeutic exercise;Patient/family education;Neuromuscular re-education;Balance training    PT Next Visit Plan  balance and therapeutic exericse    PT Home Exercise Plan  hip abd standing,    Consulted and Agree with Plan of Care  Patient       Patient will benefit from skilled therapeutic intervention in order to improve the following deficits and impairments:  Abnormal gait, Decreased balance, Decreased endurance, Decreased mobility, Difficulty walking, Pain, Decreased strength, Decreased activity tolerance  Visit Diagnosis: 1. Other abnormalities of gait and mobility   2. Muscle weakness (  generalized)   3. Difficulty in walking, not elsewhere classified        Problem List Patient Active  Problem List   Diagnosis Date Noted  . IBS (irritable bowel syndrome) 03/09/2018  . Hypokalemia 03/09/2018  . Age-related osteoporosis without current pathological fracture 03/09/2018  . Baker's cyst of knee, right 06/06/2017  . Insomnia 03/07/2017  . Tobacco abuse 12/01/2016  . Hypertension   . Hyperlipidemia   . MDD (major depressive disorder)   . Generalized anxiety disorder     Alanson Puls, [place] DPT 08/24/2018, 11:04 AM  [place] MAIN [facility] SERVICES [address] [place], [place], [address] Phone: [phone]   Fax:  [phone]  Name: Amanda Tucker MRN: [id number] Date of Birth: [birth year]

## 2018-08-28 ENCOUNTER — Ambulatory Visit: Payer: Medicare Other | Attending: Family Medicine | Admitting: Physical Therapy

## 2018-08-28 DIAGNOSIS — M6281 Muscle weakness (generalized): Secondary | ICD-10-CM | POA: Insufficient documentation

## 2018-08-28 DIAGNOSIS — R262 Difficulty in walking, not elsewhere classified: Secondary | ICD-10-CM | POA: Insufficient documentation

## 2018-08-28 DIAGNOSIS — R2689 Other abnormalities of gait and mobility: Secondary | ICD-10-CM | POA: Insufficient documentation

## 2018-08-30 ENCOUNTER — Ambulatory Visit: Payer: Self-pay | Admitting: Ophthalmology

## 2018-08-30 ENCOUNTER — Other Ambulatory Visit: Payer: Self-pay

## 2018-08-30 ENCOUNTER — Ambulatory Visit: Payer: Medicare Other | Admitting: Physical Therapy

## 2018-08-30 ENCOUNTER — Encounter: Payer: Self-pay | Admitting: Physical Therapy

## 2018-08-30 DIAGNOSIS — R2689 Other abnormalities of gait and mobility: Secondary | ICD-10-CM

## 2018-08-30 DIAGNOSIS — R262 Difficulty in walking, not elsewhere classified: Secondary | ICD-10-CM | POA: Diagnosis not present

## 2018-08-30 DIAGNOSIS — M6281 Muscle weakness (generalized): Secondary | ICD-10-CM

## 2018-08-30 NOTE — Therapy (Signed)
[place] MAIN [facility] SERVICES [address] [place], [place], [address] Phone: [phone]   Fax:  [phone]  Physical Therapy Treatment  Patient Details  Name: Amanda Tucker MRN: [id number] Date of Birth: [birth year] Referring Provider (PT): [name]   Encounter Date: 08/30/2018  PT End of Session - 08/30/18 1327    Visit Number  15    Number of Visits  17    Date for PT Re-Evaluation  08/07/18    PT Start Time  0100    PT Stop Time  0140    PT Time Calculation (min)  40 min    Equipment Utilized During Treatment  Gait belt    Activity Tolerance  Patient tolerated treatment well;Patient limited by fatigue    Behavior During Therapy  [facility] for tasks assessed/performed       Past Medical History:  Diagnosis Date  . Anxiety   . Arthritis   . Balance problem   . Depression   . Diabetes mellitus without complication ([place])   . Diverticulitis   . Environmental and seasonal allergies   . GERD (gastroesophageal reflux disease)   . HOH (hard of hearing)   . Hyperlipidemia   . Hypertension   . Osteoporosis   . Right sided sciatica   . Vertigo     Past Surgical History:  Procedure Laterality Date  . COLONOSCOPY WITH PROPOFOL N/A 02/27/2018   Procedure: COLONOSCOPY WITH PROPOFOL;  Surgeon: Jonathon Bellows, MD;  Location: [facility] ENDOSCOPY;  Service: Gastroenterology;  Laterality: N/A;  . NO PAST SURGERIES      There were no vitals filed for this visit.  Subjective Assessment - 08/30/18 1326    Subjective  Patient is feeling off balance and weakness iin BLE.She has no new concerns.    Pertinent History  Patient was doing better after her last therapy visitis and now is feeling unsteady and weak again.she doesnt use a assistive device.    Limitations  Standing;Walking    Patient Stated Goals  to walk better    Currently in Pain?  No/denies    Pain Score  0-No pain    Pain Onset  Today          Ther-ex  Nu-step x 5 mins L  2 Standing hip abd with 2# ankle weight x 15 BLE and  BUE support  Standing hip extension with 2# ankle weight x 10 BLE with BUE support  Standing hip flexion marches with 2# ankle weight x 10 BLE with BUE support  Standing heel raises x 15 with 2# ankle weights  LAQ with 2# ankle weights with 3 second holds x10 each LE  Mini squats x10 with no UE support  Prone hip extension x 10 x 2 with vc for correct technique and positions   Neuromuscular Re-education  Toe taps to 6" step without UE support alternating LE x15 each  Normal stance on Airex pad 2x60sec (requires minA for falls recovery 2 times)?  Forward and backward stepping over hurdle x15 each direction  SIde stepping over hurdle 15x each direction    Pt educated  about  posture and technique with exercises to improve exercise technique, movement at target joints, with min  verbal, visual, tactile cues.                    PT Education - 08/30/18 1326    Education Details  HEP    Person(s) Educated  Patient    Methods  Explanation    Comprehension  Verbalized understanding;Need further instruction       PT Short Term Goals - 08/08/18 1503      PT SHORT TERM GOAL #1   Title  Patient will be independent in home exercise program to improve strength/mobility for better functional independence with ADLs.    Time  4    Period  Weeks    Status  Achieved    Target Date  07/03/18      PT SHORT TERM GOAL #2   Title  Patient will increase Berg Balance score by > 6 points to demonstrate decreased fall risk during functional activities.    Time  4    Period  Weeks    Status  Partially Met    Target Date  07/03/18        PT Long Term Goals - 08/08/18 1512      PT LONG TERM GOAL #1   Title   Patient will be independent with ascend/descend 12 steps using single UE in step over step pattern without LOB.    Baseline  needs BUE railings for 12 steps    Time  8    Period  Weeks    Status  Partially Met    Target  Date  10/03/18      PT LONG TERM GOAL #2   Title  Patient will increase ABC scale score >80% to demonstrate better functional mobility and better confidence with ADLs.     Time  8    Status  Partially Met    Target Date  10/03/18      PT LONG TERM GOAL #3   Title  Patient will increase dynamic gait index score to >19/24 as to demonstrate reduced fall risk and improved dynamic gait balance for better safety with community/home ambulation.     Time  8    Period  Weeks    Status  Partially Met    Target Date  10/03/18            Plan - 08/30/18 1330    Clinical Impression Statement  Pt requires direction and verbal cues for correct performance of gait and strengthening exercises. Patient has fatigue with endurance and difficulty with longer standing tasks.  Patient struggles with posture and ability to straighten legs due to weakness, as well as balance with mobility.  Pt encouraged continuing HEP   Patient will benefit from continued skilled PT to improve mobility and safety.    Rehab Potential  Good    PT Frequency  2x / week    PT Duration  8 weeks    PT Treatment/Interventions  Gait training;Therapeutic exercise;Patient/family education;Neuromuscular re-education;Balance training    PT Next Visit Plan  balance and therapeutic exericse    PT Home Exercise Plan  hip abd standing,    Consulted and Agree with Plan of Care  Patient       Patient will benefit from skilled therapeutic intervention in order to improve the following deficits and impairments:  Abnormal gait, Decreased balance, Decreased endurance, Decreased mobility, Difficulty walking, Pain, Decreased strength, Decreased activity tolerance  Visit Diagnosis: 1. Other abnormalities of gait and mobility   2. Muscle weakness (generalized)   3. Difficulty in walking, not elsewhere classified        Problem List Patient Active Problem List   Diagnosis Date Noted  . IBS (irritable bowel syndrome) 03/09/2018  .  Hypokalemia 03/09/2018  . Age-related osteoporosis without current pathological fracture 03/09/2018  .  Baker's cyst of knee, right 06/06/2017  . Insomnia 03/07/2017  . Tobacco abuse 12/01/2016  . Hypertension   . Hyperlipidemia   . MDD (major depressive disorder)   . Generalized anxiety disorder     Alanson Puls, [place] DPT 08/30/2018, 1:46 PM  [place] MAIN [facility] SERVICES [address] [place], [place], [address] Phone: [phone]   Fax:  [phone]  Name: Jasia Hiltunen MRN: [id number] Date of Birth: November 23, 1945

## 2018-09-01 ENCOUNTER — Other Ambulatory Visit: Payer: Self-pay

## 2018-09-01 ENCOUNTER — Other Ambulatory Visit
Admission: RE | Admit: 2018-09-01 | Discharge: 2018-09-01 | Disposition: A | Discharge status: Home / Self Care | Payer: Medicare Other | Source: Ambulatory Visit | Attending: Ophthalmology | Admitting: Ophthalmology

## 2018-09-01 DIAGNOSIS — Z01812 Encounter for preprocedural laboratory examination: Secondary | ICD-10-CM | POA: Insufficient documentation

## 2018-09-01 DIAGNOSIS — Z20828 Contact with and (suspected) exposure to other viral communicable diseases: Secondary | ICD-10-CM | POA: Insufficient documentation

## 2018-09-02 LAB — SARS CORONAVIRUS 2 (TAT 6-24 HRS): SARS Coronavirus 2: NEGATIVE

## 2018-09-04 ENCOUNTER — Other Ambulatory Visit: Payer: Medicare Other

## 2018-09-06 ENCOUNTER — Ambulatory Visit: Payer: Medicare Other | Admitting: Physical Therapy

## 2018-09-07 ENCOUNTER — Encounter: Payer: Self-pay | Admitting: Anesthesiology

## 2018-09-07 ENCOUNTER — Encounter
Admission: RE | Disposition: A | Discharge status: Home / Self Care | Payer: Self-pay | Source: Home / Self Care | Attending: Ophthalmology

## 2018-09-07 ENCOUNTER — Ambulatory Visit
Admission: RE | Admit: 2018-09-07 | Discharge: 2018-09-07 | Disposition: A | Discharge status: Home / Self Care | Payer: Medicare Other | Attending: Ophthalmology | Admitting: Ophthalmology

## 2018-09-07 DIAGNOSIS — K219 Gastro-esophageal reflux disease without esophagitis: Secondary | ICD-10-CM | POA: Diagnosis not present

## 2018-09-07 DIAGNOSIS — I10 Essential (primary) hypertension: Secondary | ICD-10-CM | POA: Insufficient documentation

## 2018-09-07 DIAGNOSIS — M810 Age-related osteoporosis without current pathological fracture: Secondary | ICD-10-CM | POA: Diagnosis not present

## 2018-09-07 DIAGNOSIS — F419 Anxiety disorder, unspecified: Secondary | ICD-10-CM | POA: Diagnosis not present

## 2018-09-07 DIAGNOSIS — E7800 Pure hypercholesterolemia, unspecified: Secondary | ICD-10-CM | POA: Insufficient documentation

## 2018-09-07 DIAGNOSIS — E1136 Type 2 diabetes mellitus with diabetic cataract: Secondary | ICD-10-CM | POA: Diagnosis not present

## 2018-09-07 DIAGNOSIS — M1990 Unspecified osteoarthritis, unspecified site: Secondary | ICD-10-CM | POA: Insufficient documentation

## 2018-09-07 DIAGNOSIS — H2511 Age-related nuclear cataract, right eye: Secondary | ICD-10-CM | POA: Diagnosis not present

## 2018-09-07 DIAGNOSIS — Z87891 Personal history of nicotine dependence: Secondary | ICD-10-CM | POA: Insufficient documentation

## 2018-09-07 DIAGNOSIS — E785 Hyperlipidemia, unspecified: Secondary | ICD-10-CM | POA: Diagnosis not present

## 2018-09-07 DIAGNOSIS — F329 Major depressive disorder, single episode, unspecified: Secondary | ICD-10-CM | POA: Diagnosis not present

## 2018-09-07 DIAGNOSIS — E119 Type 2 diabetes mellitus without complications: Secondary | ICD-10-CM | POA: Diagnosis not present

## 2018-09-07 HISTORY — DX: Age-related osteoporosis without current pathological fracture: M81.0

## 2018-09-07 HISTORY — DX: Gastro-esophageal reflux disease without esophagitis: K21.9

## 2018-09-07 HISTORY — DX: Other abnormalities of gait and mobility: R26.89

## 2018-09-07 HISTORY — DX: Dizziness and giddiness: R42

## 2018-09-07 HISTORY — DX: Unspecified osteoarthritis, unspecified site: M19.90

## 2018-09-07 HISTORY — DX: Unspecified hearing loss, unspecified ear: H91.90

## 2018-09-07 HISTORY — DX: Other allergic rhinitis: J30.89

## 2018-09-07 HISTORY — PX: CATARACT EXTRACTION W/PHACO: SHX586

## 2018-09-07 SURGERY — PHACOEMULSIFICATION, CATARACT, WITH IOL INSERTION
Anesthesia: Monitor Anesthesia Care | Site: Eye | Laterality: Right

## 2018-09-07 MED ORDER — MIDAZOLAM HCL 2 MG/2ML IJ SOLN
INTRAMUSCULAR | Status: DC | PRN
  Administered 2018-09-07: 1 mg via INTRAVENOUS
  Administered 2018-09-07: 0.5 mg via INTRAVENOUS

## 2018-09-07 MED ORDER — TETRACAINE HCL 0.5 % OP SOLN
OPHTHALMIC | Status: AC
  Administered 2018-09-07: 09:00:00 1 [drp] via OPHTHALMIC
  Filled 2018-09-07: qty 4

## 2018-09-07 MED ORDER — MIDAZOLAM HCL 2 MG/2ML IJ SOLN
INTRAMUSCULAR | Status: AC
  Filled 2018-09-07: qty 2

## 2018-09-07 MED ORDER — SODIUM CHLORIDE 0.9 % IV SOLN
INTRAVENOUS | Status: DC
  Administered 2018-09-07: 10:00:00 via INTRAVENOUS

## 2018-09-07 MED ORDER — MOXIFLOXACIN HCL 0.5 % OP SOLN
OPHTHALMIC | Status: DC | PRN
  Administered 2018-09-07: 0.2 mL via OPHTHALMIC

## 2018-09-07 MED ORDER — FENTANYL CITRATE (PF) 100 MCG/2ML IJ SOLN
INTRAMUSCULAR | Status: AC
  Filled 2018-09-07: qty 2

## 2018-09-07 MED ORDER — TETRACAINE HCL 0.5 % OP SOLN
1.0000 [drp] | OPHTHALMIC | Status: AC
  Administered 2018-09-07: 1 [drp] via OPHTHALMIC

## 2018-09-07 MED ORDER — PHENYLEPHRINE HCL 10 % OP SOLN
1.0000 [drp] | Freq: Once | OPHTHALMIC | Status: AC
  Administered 2018-09-07: 09:00:00 1 [drp] via OPHTHALMIC

## 2018-09-07 MED ORDER — TRYPAN BLUE 0.06 % OP SOLN
OPHTHALMIC | Status: DC | PRN
  Administered 2018-09-07: 0.5 mL via INTRAOCULAR

## 2018-09-07 MED ORDER — NA HYALUR & NA CHOND-NA HYALUR 0.55-0.5 ML IO KIT
PACK | INTRAOCULAR | Status: DC | PRN
  Administered 2018-09-07: 1 via OPHTHALMIC

## 2018-09-07 MED ORDER — MOXIFLOXACIN HCL 0.5 % OP SOLN
OPHTHALMIC | Status: AC
  Filled 2018-09-07: qty 3

## 2018-09-07 MED ORDER — PHENYLEPHRINE HCL 10 % OP SOLN
OPHTHALMIC | Status: AC
  Administered 2018-09-07: 1 [drp] via OPHTHALMIC
  Filled 2018-09-07: qty 5

## 2018-09-07 MED ORDER — LIDOCAINE HCL (PF) 4 % IJ SOLN
INTRAOCULAR | Status: DC | PRN
  Administered 2018-09-07: 4 mL via OPHTHALMIC

## 2018-09-07 MED ORDER — ARMC OPHTHALMIC DILATING DROPS
1.0000 "application " | OPHTHALMIC | Status: AC
  Administered 2018-09-07 (×2): 1 via OPHTHALMIC

## 2018-09-07 MED ORDER — POVIDONE-IODINE 5 % OP SOLN
OPHTHALMIC | Status: DC | PRN
  Administered 2018-09-07: 1 via OPHTHALMIC

## 2018-09-07 MED ORDER — ARMC OPHTHALMIC DILATING DROPS
OPHTHALMIC | Status: AC
  Administered 2018-09-07: 1 via OPHTHALMIC
  Filled 2018-09-07: qty 0.5

## 2018-09-07 MED ORDER — CYCLOPENTOLATE HCL 2 % OP SOLN
1.0000 [drp] | Freq: Once | OPHTHALMIC | Status: AC
  Administered 2018-09-07: 09:00:00 1 [drp] via OPHTHALMIC

## 2018-09-07 MED ORDER — FENTANYL CITRATE (PF) 100 MCG/2ML IJ SOLN
INTRAMUSCULAR | Status: DC | PRN
  Administered 2018-09-07 (×2): 25 ug via INTRAVENOUS

## 2018-09-07 MED ORDER — NEOMYCIN-POLYMYXIN-DEXAMETH 3.5-10000-0.1 OP OINT
TOPICAL_OINTMENT | OPHTHALMIC | Status: DC | PRN
  Administered 2018-09-07: 1 via OPHTHALMIC

## 2018-09-07 MED ORDER — EPINEPHRINE PF 1 MG/ML IJ SOLN
INTRAOCULAR | Status: DC | PRN
  Administered 2018-09-07: 10:00:00 1 mL via OPHTHALMIC

## 2018-09-07 MED ORDER — CARBACHOL 0.01 % IO SOLN
INTRAOCULAR | Status: DC | PRN
  Administered 2018-09-07: 0.5 mL via INTRAOCULAR

## 2018-09-07 MED ORDER — MOXIFLOXACIN HCL 0.5 % OP SOLN: 1.0000 [drp] | OPHTHALMIC | Status: DC | PRN

## 2018-09-07 MED ORDER — CYCLOPENTOLATE HCL 2 % OP SOLN
OPHTHALMIC | Status: AC
  Administered 2018-09-07: 09:00:00 1 [drp] via OPHTHALMIC
  Filled 2018-09-07: qty 2

## 2018-09-07 MED ORDER — NA CHONDROIT SULF-NA HYALURON 40-17 MG/ML IO SOLN
INTRAOCULAR | Status: DC | PRN
  Administered 2018-09-07: 1 mL via INTRAOCULAR

## 2018-09-07 SURGICAL SUPPLY — 18 items
BNDG EYE OVAL (GAUZE/BANDAGES/DRESSINGS) ×6 IMPLANT
DISSECTOR HYDRO NUCLEUS 50X22 (MISCELLANEOUS) ×12 IMPLANT
DRSG TEGADERM 2-3/8X2-3/4 SM (GAUZE/BANDAGES/DRESSINGS) ×3 IMPLANT
GLOVE BIOGEL M 6.5 STRL (GLOVE) ×3 IMPLANT
GOWN STRL REUS W/ TWL LRG LVL3 (GOWN DISPOSABLE) ×1 IMPLANT
GOWN STRL REUS W/ TWL XL LVL3 (GOWN DISPOSABLE) ×1 IMPLANT
GOWN STRL REUS W/TWL LRG LVL3 (GOWN DISPOSABLE) ×2
GOWN STRL REUS W/TWL XL LVL3 (GOWN DISPOSABLE) ×2
KNIFE 45D UP 2.3 (MISCELLANEOUS) ×3 IMPLANT
LABEL CATARACT MEDS ST (LABEL) ×3 IMPLANT
LENS IOL ACRYSOF IQ 20.5 (Intraocular Lens) ×3 IMPLANT
PACK CATARACT (MISCELLANEOUS) ×3 IMPLANT
PACK CATARACT KING (MISCELLANEOUS) ×3 IMPLANT
PACK EYE AFTER SURG (MISCELLANEOUS) ×3 IMPLANT
SOL BSS BAG (MISCELLANEOUS) ×3
SOLUTION BSS BAG (MISCELLANEOUS) ×1 IMPLANT
WATER STERILE IRR 250ML POUR (IV SOLUTION) ×3 IMPLANT
WIPE NON LINTING 3.25X3.25 (MISCELLANEOUS) ×3 IMPLANT

## 2018-09-07 NOTE — Anesthesia Post-op Follow-up Note (Signed)
Anesthesia QCDR form completed.        

## 2018-09-07 NOTE — H&P (Signed)
   I have reviewed the patient's H&P and agree with its findings. There have been no interval changes.  Militza Devery MD Ophthalmology 

## 2018-09-07 NOTE — Transfer of Care (Signed)
Immediate Anesthesia Transfer of Care Note  Patient: Amanda Tucker  Procedure(s) Performed: CATARACT EXTRACTION PHACO AND INTRAOCULAR LENS PLACEMENT (Right Eye)  Patient Location: PACU  Anesthesia Type:MAC  Level of Consciousness: awake, alert  and oriented  Airway & Oxygen Therapy: Patient Spontanous Breathing  Post-op Assessment: Report given to RN and Post -op Vital signs reviewed and stable  Post vital signs: Reviewed and stable  Last Vitals:  Vitals Value Taken Time  BP 126/73 09/07/18 1036  Temp 36.7 C 09/07/18 1036  Pulse 62 09/07/18 1036  Resp 18 09/07/18 1036  SpO2 99 % 09/07/18 1036    Last Pain:  Vitals:   09/07/18 1036  TempSrc: Oral  PainSc: 0-No pain         Complications: No apparent anesthesia complications

## 2018-09-07 NOTE — Anesthesia Procedure Notes (Signed)
Procedure Name: MAC Date/Time: 09/07/2018 9:50 AM Performed by: Johnna Acosta, CRNA Pre-anesthesia Checklist: Patient identified, Emergency Drugs available, Suction available, Patient being monitored and Timeout performed Patient Re-evaluated:Patient Re-evaluated prior to induction Oxygen Delivery Method: Nasal cannula

## 2018-09-07 NOTE — Anesthesia Postprocedure Evaluation (Signed)
Anesthesia Post Note  Patient: Amanda Tucker  Procedure(s) Performed: CATARACT EXTRACTION PHACO AND INTRAOCULAR LENS PLACEMENT (Right Eye)  Patient location during evaluation: PACU Anesthesia Type: MAC Level of consciousness: awake and alert Pain management: pain level controlled Vital Signs Assessment: post-procedure vital signs reviewed and stable Respiratory status: spontaneous breathing, nonlabored ventilation, respiratory function stable and patient connected to nasal cannula oxygen Cardiovascular status: stable and blood pressure returned to baseline Postop Assessment: no apparent nausea or vomiting Anesthetic complications: no     Last Vitals:  Vitals:   09/07/18 0914 09/07/18 1036  BP: 129/90 126/73  Pulse:  62  Resp:  18  Temp:  36.7 C  SpO2:  99%    Last Pain:  Vitals:   09/07/18 1036  TempSrc: Oral  PainSc: 0-No pain                 Elvyn Krohn S

## 2018-09-07 NOTE — Op Note (Signed)
PREOPERATIVE DIAGNOSIS:  Nuclear sclerotic cataract of the RIGHT eye.   POSTOPERATIVE DIAGNOSIS:  Nuclear sclerotic cataract of the RIGHT eye.   OPERATIVE PROCEDURE: Cataract surgery OD   SURGEON:  Marchia Meiers, MD.   ANESTHESIA:  Anesthesiologist: Gunnar Bulla, MD CRNA: Johnna Acosta, CRNA  1.      Managed anesthesia care. 2.     0.62ml of Shugarcaine was instilled following the paracentesis   COMPLICATIONS:  None.   TECHNIQUE:   Divide and conquer   DESCRIPTION OF PROCEDURE:  The patient was examined and consented in the preoperative holding area where the aforementioned topical anesthesia was applied to the RIGHT eye and then brought back to the Operating Room where the RIGHT eye was prepped and draped in the usual sterile ophthalmic fashion and a lid speculum was placed. A paracentesis was created with the side port blade, the anterior chamber was washed out with trypan blue to stain the anterior capsule, and the anterior chamber was filled with viscoelastic. A near clear corneal incision was performed with the steel keratome. A continuous curvilinear capsulorrhexis was performed with a cystotome followed by the capsulorrhexis forceps. Hydrodissection and hydrodelineation were carried out with BSS on a blunt cannula. The lens was removed in a divide and conquer  technique and the remaining cortical material was removed with the irrigation-aspiration handpiece. The capsular bag was inflated with viscoelastic and the lens was placed in the capsular bag without complication. The remaining viscoelastic was removed from the eye with the irrigation-aspiration handpiece. The wounds were hydrated. The anterior chamber was flushed and the eye was inflated to physiologic pressure. 0.46ml Vigamox was placed in the anterior chamber. The wounds were found to be water tight. The eye was dressed with Vigamox. The patient was given protective glasses to wear throughout the day and a shield with which  to sleep tonight. The patient was also given drops with which to begin a drop regimen today and will follow-up with me in one day. Implant Name Type Inv. Item Serial No. Manufacturer Lot No. LRB No. Used Action  LENS IOL ACRYSOF IQ 20.5 - [id number] Intraocular Lens LENS IOL ACRYSOF IQ 20.5 [id number] [employer]CON  Right 1 Implanted    Procedure(s) with comments: CATARACT EXTRACTION PHACO AND INTRAOCULAR LENS PLACEMENT (Right) - [place]: 01:08.2 CDE: 11.02 Fluid Pack Lot # [id number]  Electronically signed: Marchia Meiers 09/07/2018 11:54 AM

## 2018-09-07 NOTE — Discharge Instructions (Addendum)
Eye Surgery Discharge Instructions  Expect mild scratchy sensation or mild soreness. DO NOT RUB YOUR EYE!  The day of surgery:  Minimal physical activity, but bed rest is not required  No reading, computer work, or close hand work  No bending, lifting, or straining.  May watch TV  For 24 hours:  No driving, legal decisions, or alcoholic beverages  Safety precautions  Eat anything you prefer: It is better to start with liquids, then soup then solid foods.  Keep eye shield in place, do not remove.  Resume all regular medications including aspirin or Coumadin if these were discontinued prior to surgery. You may shower, bathe, shave, or wash your hair. Tylenol may be taken for mild discomfort.  Call your doctor if you experience significant pain, nausea, or vomiting, fever > 101 or other signs of infection. [phone] or [phone] Specific instructions:  Follow-up Information    Marchia Meiers, MD Follow up.   Specialty: Ophthalmology Why: 09/08/18 @ 9:30 am  Contact information: [place] [address] [phone]

## 2018-09-07 NOTE — Anesthesia Preprocedure Evaluation (Signed)
Anesthesia Evaluation  Patient identified by MRN, date of birth, ID band Patient awake    Reviewed: Allergy & Precautions, NPO status , Patient's Chart, lab work & pertinent test results, reviewed documented beta blocker date and time   Airway Mallampati: II  TM Distance: >3 FB     Dental  (+) Chipped   Pulmonary former smoker,           Cardiovascular hypertension, Pt. on medications      Neuro/Psych PSYCHIATRIC DISORDERS Anxiety Depression  Neuromuscular disease    GI/Hepatic GERD  Controlled,  Endo/Other  diabetes, Type 2  Renal/GU      Musculoskeletal  (+) Arthritis ,   Abdominal   Peds  Hematology   Anesthesia Other Findings   Reproductive/Obstetrics                             Anesthesia Physical Anesthesia Plan  ASA: III  Anesthesia Plan: MAC   Post-op Pain Management:    Induction:   PONV Risk Score and Plan:   Airway Management Planned:   Additional Equipment:   Intra-op Plan:   Post-operative Plan:   Informed Consent: I have reviewed the patients History and Physical, chart, labs and discussed the procedure including the risks, benefits and alternatives for the proposed anesthesia with the patient or authorized representative who has indicated his/her understanding and acceptance.       Plan Discussed with: CRNA  Anesthesia Plan Comments:         Anesthesia Quick Evaluation

## 2018-09-11 ENCOUNTER — Ambulatory Visit: Payer: Medicare Other | Admitting: Physical Therapy

## 2018-09-13 ENCOUNTER — Ambulatory Visit: Payer: Medicare Other | Admitting: Physical Therapy

## 2018-09-18 ENCOUNTER — Ambulatory Visit: Payer: Medicare Other | Admitting: Physical Therapy

## 2018-09-18 ENCOUNTER — Encounter: Payer: Self-pay | Admitting: Physical Therapy

## 2018-09-18 ENCOUNTER — Other Ambulatory Visit: Payer: Self-pay

## 2018-09-18 DIAGNOSIS — R2689 Other abnormalities of gait and mobility: Secondary | ICD-10-CM

## 2018-09-18 DIAGNOSIS — R262 Difficulty in walking, not elsewhere classified: Secondary | ICD-10-CM

## 2018-09-18 DIAGNOSIS — M6281 Muscle weakness (generalized): Secondary | ICD-10-CM | POA: Diagnosis not present

## 2018-09-18 NOTE — Therapy (Signed)
[place] MAIN [facility] SERVICES [address] [place], [place], [address] Phone: [phone]   Fax:  [phone]  Physical Therapy Treatment  Patient Details  Name: Amanda Tucker MRN: [id number] Date of Birth: Jun 16, 1945 Referring Provider (PT): [name]   Encounter Date: 09/18/2018  PT End of Session - 09/18/18 1514    Visit Number  17    Number of Visits  17    Date for PT Re-Evaluation  08/07/18    PT Start Time  0300    PT Stop Time  0343    PT Time Calculation (min)  43 min    Equipment Utilized During Treatment  Gait belt    Activity Tolerance  Patient tolerated treatment well;Patient limited by fatigue    Behavior During Therapy  WFL for tasks assessed/performed       Past Medical History:  Diagnosis Date  . Anxiety   . Arthritis   . Balance problem   . Depression   . Diabetes mellitus without complication ([place])   . Diverticulitis   . Environmental and seasonal allergies   . GERD (gastroesophageal reflux disease)   . HOH (hard of hearing)   . Hyperlipidemia   . Hypertension   . Osteoporosis   . Right sided sciatica   . Vertigo     Past Surgical History:  Procedure Laterality Date  . CATARACT EXTRACTION W/PHACO Right 09/07/2018   Procedure: CATARACT EXTRACTION PHACO AND INTRAOCULAR LENS PLACEMENT;  Surgeon: Marchia Meiers, MD;  Location: [facility] ORS;  Service: Ophthalmology;  Laterality: Right;  [place]: 01:08.2 CDE: 11.02 Fluid Pack Lot # [id number]  . COLONOSCOPY WITH PROPOFOL N/A 02/27/2018   Procedure: COLONOSCOPY WITH PROPOFOL;  Surgeon: Jonathon Bellows, MD;  Location: [facility] ENDOSCOPY;  Service: Gastroenterology;  Laterality: N/A;  . NO PAST SURGERIES      There were no vitals filed for this visit.  Subjective Assessment - 09/18/18 1513    Subjective  Patient is feeling off balance and weakness iin BLE.She has no new concerns.    Pertinent History  Patient was doing better after her last therapy visitis and now is feeling  unsteady and weak again.she doesnt use a assistive device.    Limitations  Standing;Walking    Patient Stated Goals  to walk better    Currently in Pain?  Yes    Pain Score  5     Pain Location  Knee    Pain Orientation  Right    Pain Descriptors / Indicators  Aching    Pain Onset  Today             Ther-ex  Nu-step x 5 mins L 2 Heel raises x 20  Seated Marching with 2 lbs x 20  Standing hip abd with 2# ankle weight x 15 BLE and  BUE support  Standing hip extension with 2# ankle weight x 10 BLE with BUE support  Standing hip flexion marches with 2# ankle weight x 10 BLE with BUE support  Standing heel raises x 15 with 2# ankle weights  LAQ with 2# ankle weights with 3 second holds x10 each LE  Mini squats x10 with no UE support  Prone hip extension x 10 x 2 with vc for correct technique and positions  Neuromuscular Re-education  Toe taps to 6" step without UE support alternating LE x15 each  Normal stance on Airex pad 2x60sec (requires minA for falls recovery 2 times)? Forward and backward stepping over hurdle x15 each direction  SIde stepping over hurdle 15x each direction    Pt educated  about  posture and technique with exercises to improve exercise technique, movement at target joints, with min  verbal, visual, tactile cues                   PT Education - 09/18/18 1514    Education Details  HEP    Person(s) Educated  Patient    Methods  Explanation    Comprehension  Returned demonstration;Verbalized understanding;Need further instruction       PT Short Term Goals - 08/08/18 1503      PT SHORT TERM GOAL #1   Title  Patient will be independent in home exercise program to improve strength/mobility for better functional independence with ADLs.    Time  4    Period  Weeks    Status  Achieved    Target Date  07/03/18      PT SHORT TERM GOAL #2   Title  Patient will increase Berg Balance score by > 6 points to demonstrate decreased fall risk  during functional activities.    Time  4    Period  Weeks    Status  Partially Met    Target Date  07/03/18        PT Long Term Goals - 08/08/18 1512      PT LONG TERM GOAL #1   Title   Patient will be independent with ascend/descend 12 steps using single UE in step over step pattern without LOB.    Baseline  needs BUE railings for 12 steps    Time  8    Period  Weeks    Status  Partially Met    Target Date  10/03/18      PT LONG TERM GOAL #2   Title  Patient will increase ABC scale score >80% to demonstrate better functional mobility and better confidence with ADLs.     Time  8    Status  Partially Met    Target Date  10/03/18      PT LONG TERM GOAL #3   Title  Patient will increase dynamic gait index score to >19/24 as to demonstrate reduced fall risk and improved dynamic gait balance for better safety with community/home ambulation.     Time  8    Period  Weeks    Status  Partially Met    Target Date  10/03/18            Plan - 09/18/18 1517    Clinical Impression Statement  Pt presents shuffling feet without AD.   Patient requires cues for upright posture and forward head with all exercises but tolerated all exercises well. Pt performed well with cues to raise step height with stepping activity. Patient will benefit from continued skilled PT interventions for improved posture, strength, balance, and QOL.    Rehab Potential  Good    PT Frequency  2x / week    PT Duration  8 weeks    PT Treatment/Interventions  Gait training;Therapeutic exercise;Patient/family education;Neuromuscular re-education;Balance training    PT Next Visit Plan  balance and therapeutic exericse    PT Home Exercise Plan  hip abd standing,    Consulted and Agree with Plan of Care  Patient       Patient will benefit from skilled therapeutic intervention in order to improve the following deficits and impairments:  Abnormal gait, Decreased balance, Decreased endurance, Decreased mobility,  Difficulty walking, Pain, Decreased  strength, Decreased activity tolerance  Visit Diagnosis: Other abnormalities of gait and mobility  Muscle weakness (generalized)  Difficulty in walking, not elsewhere classified     Problem List Patient Active Problem List   Diagnosis Date Noted  . IBS (irritable bowel syndrome) 03/09/2018  . Hypokalemia 03/09/2018  . Age-related osteoporosis without current pathological fracture 03/09/2018  . Baker's cyst of knee, right 06/06/2017  . Insomnia 03/07/2017  . Tobacco abuse 12/01/2016  . Hypertension   . Hyperlipidemia   . MDD (major depressive disorder)   . Generalized anxiety disorder     Alanson Puls, [place] DPT 09/18/2018, 3:19 PM  [place] MAIN [facility] SERVICES [address] [place], [place], [address] Phone: [phone]   Fax:  [phone]  Name: Joss Friedel MRN: [id number] Date of Birth: [birth year]

## 2018-09-20 ENCOUNTER — Other Ambulatory Visit: Payer: Self-pay

## 2018-09-20 ENCOUNTER — Ambulatory Visit: Payer: Medicare Other | Admitting: Physical Therapy

## 2018-09-20 ENCOUNTER — Encounter: Payer: Self-pay | Admitting: Physical Therapy

## 2018-09-20 DIAGNOSIS — R262 Difficulty in walking, not elsewhere classified: Secondary | ICD-10-CM | POA: Diagnosis not present

## 2018-09-20 DIAGNOSIS — M6281 Muscle weakness (generalized): Secondary | ICD-10-CM

## 2018-09-20 DIAGNOSIS — R2689 Other abnormalities of gait and mobility: Secondary | ICD-10-CM

## 2018-09-20 NOTE — Therapy (Signed)
[place] MAIN [facility] SERVICES [address] [place], [place], [address] Phone: [phone]   Fax:  [phone]  Physical Therapy Treatment  Patient Details  Name: Amanda Tucker MRN: [id number] Date of Birth: August 19, 1945 Referring Provider (PT): [name]   Encounter Date: 09/20/2018  PT End of Session - 09/20/18 1022    Visit Number  17    Number of Visits  33    Date for PT Re-Evaluation  10/02/18    PT Start Time  3825    PT Stop Time  1100    PT Time Calculation (min)  45 min    Equipment Utilized During Treatment  Gait belt    Activity Tolerance  Patient tolerated treatment well;Patient limited by fatigue    Behavior During Therapy  [facility] for tasks assessed/performed       Past Medical History:  Diagnosis Date  . Anxiety   . Arthritis   . Balance problem   . Depression   . Diabetes mellitus without complication ([place])   . Diverticulitis   . Environmental and seasonal allergies   . GERD (gastroesophageal reflux disease)   . HOH (hard of hearing)   . Hyperlipidemia   . Hypertension   . Osteoporosis   . Right sided sciatica   . Vertigo     Past Surgical History:  Procedure Laterality Date  . CATARACT EXTRACTION W/PHACO Right 09/07/2018   Procedure: CATARACT EXTRACTION PHACO AND INTRAOCULAR LENS PLACEMENT;  Surgeon: Marchia Meiers, MD;  Location: [facility] ORS;  Service: Ophthalmology;  Laterality: Right;  [place]: 01:08.2 CDE: 11.02 Fluid Pack Lot # [id number]  . COLONOSCOPY WITH PROPOFOL N/A 02/27/2018   Procedure: COLONOSCOPY WITH PROPOFOL;  Surgeon: Jonathon Bellows, MD;  [facility]: [facility] ENDOSCOPY;  Service: Gastroenterology;  Laterality: N/A;  . NO PAST SURGERIES      There were no vitals filed for this visit.  Subjective Assessment - 09/20/18 1022    Subjective  Patient is feeling off balance and weakness iin BLE.She has no new concerns.    Pertinent History  Patient was doing better after her last therapy visitis and now is feeling  unsteady and weak again.she doesnt use a assistive device.    Limitations  Standing;Walking    Patient Stated Goals  to walk better    Currently in Pain?  No/denies    Pain Score  0-No pain    Pain Onset  Today            Ther-ex Quantum leg press 45 # x 20, Quantum heel raises 45# 2 x 20; Step-ups to 6' step without UE support alternating LE x 10 each; Eccentric step downs from 2 inch x 5 reps x 4 sets BLE   Neuromuscular Re-education Lunges left and right x 10  Toe taps to 6" step without UE support alternating LE x10each  1/2 foam roll (round side up) static tandem balance alternating forward LE x 30s each;; Modified tandem stand with head turns on foam x 2 mins , CGA and cues for correct posture Pt educated throughout session about proper posture and technique with exercises. Improved exercise technique, movement at target joints, use of target muscles after min to mod verbal, visual, tactile cues. Patient tends to speed up and walk faster pace with distance Pt requires direction and verbal cues for correct performance of standing dynamic balance exercises and strengthening exercises.  PT Education - 09/20/18 1022    Education Details  hep    Person(s) Educated  Patient    Methods  Explanation;Demonstration    Comprehension  Verbalized understanding;Returned demonstration;Need further instruction       PT Short Term Goals - 08/08/18 1503      PT SHORT TERM GOAL #1   Title  Patient will be independent in home exercise program to improve strength/mobility for better functional independence with ADLs.    Time  4    Period  Weeks    Status  Achieved    Target Date  07/03/18      PT SHORT TERM GOAL #2   Title  Patient will increase Berg Balance score by > 6 points to demonstrate decreased fall risk during functional activities.    Time  4    Period  Weeks    Status  Partially Met    Target Date  07/03/18        PT Long  Term Goals - 08/08/18 1512      PT LONG TERM GOAL #1   Title   Patient will be independent with ascend/descend 12 steps using single UE in step over step pattern without LOB.    Baseline  needs BUE railings for 12 steps    Time  8    Period  Weeks    Status  Partially Met    Target Date  10/03/18      PT LONG TERM GOAL #2   Title  Patient will increase ABC scale score >80% to demonstrate better functional mobility and better confidence with ADLs.     Time  8    Status  Partially Met    Target Date  10/03/18      PT LONG TERM GOAL #3   Title  Patient will increase dynamic gait index score to >19/24 as to demonstrate reduced fall risk and improved dynamic gait balance for better safety with community/home ambulation.     Time  8    Period  Weeks    Status  Partially Met    Target Date  10/03/18            Plan - 09/20/18 1025    Clinical Impression Statement  Patient demonstrates weakness in BLE hips and knees and has unstable balance on uneven surfaces. Patient challenged closed chain and open chain exercises with multiple repetitions due to fatigue. Weak LE combined with weak core musculature results in fair postural control and minimal balance deficits. Patient will benefit from continued skilled PT to improve mobility and safety.    Rehab Potential  Good    PT Frequency  2x / week    PT Duration  8 weeks    PT Treatment/Interventions  Gait training;Therapeutic exercise;Patient/family education;Neuromuscular re-education;Balance training    PT Next Visit Plan  balance and therapeutic exericse    PT Home Exercise Plan  hip abd standing,    Consulted and Agree with Plan of Care  Patient       Patient will benefit from skilled therapeutic intervention in order to improve the following deficits and impairments:  Abnormal gait, Decreased balance, Decreased endurance, Decreased mobility, Difficulty walking, Pain, Decreased strength, Decreased activity tolerance  Visit  Diagnosis: Other abnormalities of gait and mobility  Muscle weakness (generalized)  Difficulty in walking, not elsewhere classified     Problem List Patient Active Problem List   Diagnosis Date Noted  . IBS (irritable bowel syndrome) 03/09/2018  .  Hypokalemia 03/09/2018  . Age-related osteoporosis without current pathological fracture 03/09/2018  . Baker's cyst of knee, right 06/06/2017  . Insomnia 03/07/2017  . Tobacco abuse 12/01/2016  . Hypertension   . Hyperlipidemia   . MDD (major depressive disorder)   . Generalized anxiety disorder     Alanson Puls, [place] DPT 09/20/2018, 10:27 AM  [place] MAIN [facility] SERVICES [address] [place], [place], [address] Phone: [phone]   Fax:  [phone]  Name: Amanda Tucker MRN: [id number] Date of Birth: Jun 17, 1945

## 2018-09-21 ENCOUNTER — Other Ambulatory Visit: Payer: Self-pay | Admitting: Family Medicine

## 2018-09-21 MED ORDER — ALPRAZOLAM 0.5 MG PO TABS: ORAL_TABLET | ORAL | 3 refills | Status: DC

## 2018-09-21 NOTE — Telephone Encounter (Signed)
Pt needs refill   Alprazolam 0.5 mg  [facility] [address] [employer]

## 2018-09-25 ENCOUNTER — Ambulatory Visit: Payer: BLUE CROSS/BLUE SHIELD | Admitting: Physical Therapy

## 2018-09-27 ENCOUNTER — Ambulatory Visit: Payer: BLUE CROSS/BLUE SHIELD | Admitting: Physical Therapy

## 2018-09-28 DIAGNOSIS — H2512 Age-related nuclear cataract, left eye: Secondary | ICD-10-CM | POA: Diagnosis not present

## 2018-10-04 ENCOUNTER — Encounter: Payer: Self-pay | Admitting: *Deleted

## 2018-10-05 ENCOUNTER — Encounter: Payer: Self-pay | Admitting: Physical Therapy

## 2018-10-05 ENCOUNTER — Ambulatory Visit: Payer: Medicare Other | Attending: Family Medicine | Admitting: Physical Therapy

## 2018-10-05 ENCOUNTER — Other Ambulatory Visit: Payer: Self-pay

## 2018-10-05 DIAGNOSIS — R2689 Other abnormalities of gait and mobility: Secondary | ICD-10-CM | POA: Insufficient documentation

## 2018-10-05 DIAGNOSIS — R262 Difficulty in walking, not elsewhere classified: Secondary | ICD-10-CM | POA: Insufficient documentation

## 2018-10-05 DIAGNOSIS — M6281 Muscle weakness (generalized): Secondary | ICD-10-CM | POA: Diagnosis not present

## 2018-10-05 NOTE — Therapy (Signed)
[place] MAIN [facility] SERVICES [address] [place], [place], [address] Phone: [phone]   Fax:  [phone]  Physical Therapy Treatment  Patient Details  Name: Amanda Tucker MRN: [id number] Date of Birth: 1946-01-25 Referring Provider (PT): [name]   Encounter Date: 10/05/2018  PT End of Session - 10/05/18 1034    Visit Number  19    Number of Visits  33    Date for PT Re-Evaluation  11/27/18    PT Start Time  1020    PT Stop Time  1100    PT Time Calculation (min)  40 min    Equipment Utilized During Treatment  Gait belt    Activity Tolerance  Patient tolerated treatment well;Patient limited by fatigue    Behavior During Therapy  [facility] for tasks assessed/performed       Past Medical History:  Diagnosis Date  . Anxiety   . Arthritis   . Balance problem   . Depression   . Diabetes mellitus without complication ([place])   . Diverticulitis   . Environmental and seasonal allergies   . GERD (gastroesophageal reflux disease)   . HOH (hard of hearing)   . Hyperlipidemia   . Hypertension   . IBS (irritable bowel syndrome)   . Osteoporosis   . Right sided sciatica   . Vertigo     Past Surgical History:  Procedure Laterality Date  . CATARACT EXTRACTION W/PHACO Right 09/07/2018   Procedure: CATARACT EXTRACTION PHACO AND INTRAOCULAR LENS PLACEMENT;  Surgeon: Marchia Meiers, MD;  Location: [facility] ORS;  Service: Ophthalmology;  Laterality: Right;  [place]: 01:08.2 CDE: 11.02 Fluid Pack Lot # [id number]  . COLONOSCOPY WITH PROPOFOL N/A 02/27/2018   Procedure: COLONOSCOPY WITH PROPOFOL;  Surgeon: Jonathon Bellows, MD;  Location: [facility] ENDOSCOPY;  Service: Gastroenterology;  Laterality: N/A;  . NO PAST SURGERIES      There were no vitals filed for this visit.  Subjective Assessment - 10/05/18 1033    Subjective  Patient is feeling off balance and weakness iin BLE.She has no new concerns.    Pertinent History  Patient was doing better after her last  therapy visitis and now is feeling unsteady and weak again.she doesnt use a assistive device.    Limitations  Standing;Walking    Patient Stated Goals  to walk better    Currently in Pain?  No/denies    Pain Score  0-No pain    Pain Onset  Today        Treatment: Ther-ex Nu-step x 5 mins L 2 Heel raises x 20  Seated Marching with 2 lbs x 20  Standing hip abd witth YTB  x 15 BLE and BUE support  Standing hip extension with YTB x 10 BLE with BUE support  Standing hip flexion marches with YTBx 10 BLE with BUE support  Standing heel raises x 15  Leg press  45 lbs x 20 x 3   Neuromuscular Re-education  Stepping over hurdle fwd/bwd x 15 , cues for posture and slowing down Tapping 1/2 foam x 15 from blue foam , cues for foot placement Toe taps to 6" step without UE support alternating LE x15 each , cues for placement Normal stance on Airex pad 2x 30 sec ? Forward and backward stepping over hurdle x15 each direction , cues for posture , up right  SIde stepping over hurdle 15x each direction  Pt educated about posture and technique with exercises to improve exercise technique, movement at target joints,  with min verbal, visual, tactile cues                        PT Education - 10/05/18 1033    Education Details  HEP    Person(s) Educated  Patient    Methods  Explanation    Comprehension  Verbalized understanding;Returned demonstration;Tactile cues required;Need further instruction       PT Short Term Goals - 08/08/18 1503      PT SHORT TERM GOAL #1   Title  Patient will be independent in home exercise program to improve strength/mobility for better functional independence with ADLs.    Time  4    Period  Weeks    Status  Achieved    Target Date  07/03/18      PT SHORT TERM GOAL #2   Title  Patient will increase Berg Balance score by > 6 points to demonstrate decreased fall risk during functional activities.    Time  4    Period  Weeks     Status  Partially Met    Target Date  07/03/18        PT Long Term Goals - 10/05/18 1042      PT LONG TERM GOAL #1   Title   Patient will be independent with ascend/descend 12 steps using single UE in step over step pattern without LOB.    Baseline  needs BUE railings for 12 steps    Time  8    Period  Weeks    Status  Partially Met    Target Date  11/27/18      PT LONG TERM GOAL #2   Title  Patient will increase ABC scale score >80% to demonstrate better functional mobility and better confidence with ADLs.     Time  8    Status  Partially Met    Target Date  11/27/18      PT LONG TERM GOAL #3   Title  Patient will increase dynamic gait index score to >19/24 as to demonstrate reduced fall risk and improved dynamic gait balance for better safety with community/home ambulation.     Time  8    Period  Weeks    Status  Partially Met    Target Date  11/27/18            Plan - 10/05/18 1035    Clinical Impression Statement  Pt requires direction and verbal cues for correct performance of exercises. Patient demonstrates weakness in BUE and performs open and closed chain exercises with no reports of pain. Pt was able to perform all exercises with min assist and VC for technique  Pt encouraged continuing HEP .Follow-up as scheduled.    Rehab Potential  Good    PT Frequency  2x / week    PT Duration  8 weeks    PT Treatment/Interventions  Gait training;Therapeutic exercise;Patient/family education;Neuromuscular re-education;Balance training    PT Next Visit Plan  balance and therapeutic exericse    PT Home Exercise Plan  hip abd standing,    Consulted and Agree with Plan of Care  Patient       Patient will benefit from skilled therapeutic intervention in order to improve the following deficits and impairments:  Abnormal gait, Decreased balance, Decreased endurance, Decreased mobility, Difficulty walking, Pain, Decreased strength, Decreased activity tolerance  Visit  Diagnosis: Other abnormalities of gait and mobility - Plan: PT plan of care cert/re-cert  Muscle weakness (generalized) -  Plan: PT plan of care cert/re-cert  Difficulty in walking, not elsewhere classified - Plan: PT plan of care cert/re-cert     Problem List Patient Active Problem List   Diagnosis Date Noted  . IBS (irritable bowel syndrome) 03/09/2018  . Hypokalemia 03/09/2018  . Age-related osteoporosis without current pathological fracture 03/09/2018  . Baker's cyst of knee, right 06/06/2017  . Insomnia 03/07/2017  . Tobacco abuse 12/01/2016  . Hypertension   . Hyperlipidemia   . MDD (major depressive disorder)   . Generalized anxiety disorder     Arelia Sneddon S,PT DPT 10/05/2018, 11:26 AM  [place] MAIN [facility] SERVICES [address] [place], [place], [address] Phone: [phone]   Fax:  [phone]  Name: Amanda Tucker MRN: [id number] Date of Birth: 09-15-1945

## 2018-10-09 ENCOUNTER — Other Ambulatory Visit: Payer: Self-pay | Admitting: Family Medicine

## 2018-10-09 NOTE — Telephone Encounter (Signed)
[facility] faxed refill request for the following medications:  sertraline (ZOLOFT) 100 MG tablet   Please advise.  

## 2018-10-09 NOTE — Telephone Encounter (Signed)
RX was refilled on 06/29/2018 for a 90 day supply with 3 refills.

## 2018-10-10 ENCOUNTER — Ambulatory Visit: Payer: Medicare Other | Admitting: Physical Therapy

## 2018-10-12 ENCOUNTER — Ambulatory Visit: Payer: Medicare Other | Admitting: Physical Therapy

## 2018-10-12 ENCOUNTER — Encounter: Payer: Self-pay | Admitting: Physical Therapy

## 2018-10-12 ENCOUNTER — Other Ambulatory Visit: Payer: Self-pay

## 2018-10-12 DIAGNOSIS — R2689 Other abnormalities of gait and mobility: Secondary | ICD-10-CM

## 2018-10-12 DIAGNOSIS — R262 Difficulty in walking, not elsewhere classified: Secondary | ICD-10-CM

## 2018-10-12 DIAGNOSIS — M6281 Muscle weakness (generalized): Secondary | ICD-10-CM | POA: Diagnosis not present

## 2018-10-12 NOTE — Therapy (Addendum)
[place] MAIN [facility] SERVICES [address] [place], [place], [address] Phone: [phone]   Fax:  [phone]  Physical Therapy Treatment  Patient Details  Name: Amanda Tucker MRN: [id number] Date of Birth: March 16, 1945 Referring Provider (PT): [name]   Encounter Date: 10/12/2018  PT End of Session - 10/12/18 1152    Visit Number  19    Number of Visits  33    Date for PT Re-Evaluation  11/27/18    PT Start Time  1150    PT Stop Time  1230    PT Time Calculation (min)  40 min    Equipment Utilized During Treatment  Gait belt    Activity Tolerance  Patient tolerated treatment well;Patient limited by fatigue    Behavior During Therapy  [facility] for tasks assessed/performed       Past Medical History:  Diagnosis Date  . Anxiety   . Arthritis   . Balance problem   . Depression   . Diabetes mellitus without complication ([place])   . Diverticulitis   . Environmental and seasonal allergies   . GERD (gastroesophageal reflux disease)   . HOH (hard of hearing)   . Hyperlipidemia   . Hypertension   . IBS (irritable bowel syndrome)   . Osteoporosis   . Right sided sciatica   . Vertigo     Past Surgical History:  Procedure Laterality Date  . CATARACT EXTRACTION W/PHACO Right 09/07/2018   Procedure: CATARACT EXTRACTION PHACO AND INTRAOCULAR LENS PLACEMENT;  Surgeon: Marchia Meiers, MD;  Location: [facility] ORS;  Service: Ophthalmology;  Laterality: Right;  [place]: 01:08.2 CDE: 11.02 Fluid Pack Lot # [id number]  . COLONOSCOPY WITH PROPOFOL N/A 02/27/2018   Procedure: COLONOSCOPY WITH PROPOFOL;  Surgeon: Jonathon Bellows, MD;  Location: [facility] ENDOSCOPY;  Service: Gastroenterology;  Laterality: N/A;  . NO PAST SURGERIES      There were no vitals filed for this visit.  Subjective Assessment - 10/12/18 1151    Subjective  Patient is feeling off balance and weakness iin BLE.She has no new concerns.    Pertinent History  Patient was doing better after her last  therapy visitis and now is feeling unsteady and weak again.she doesnt use a assistive device.    Limitations  Standing;Walking    Patient Stated Goals  to walk better    Currently in Pain?  No/denies    Pain Score  0-No pain    Pain Onset  Today       Ther-ex Nu-step x 5 mins L 2 SAQ with 3 lbs x 20 BLE with 3 sec hold  Quantum leg press45# x 20,cues for correct technique Step-ups to 6' step without UE support alternating LE x 10 each; Standing hip ext with YTB x 15 x 2 BLE  Standing hip abd  with YTB x 15 x 2 BLE  SLR x 10 x 2 BLE, cues for correct height and speed  Neuromuscular Re-education Lunges left and right x 10 Toe taps to 6" step without UE support alternating LE x10each  1/2 foam roll (round side up) static tandem balance alternating forward LE x 30s each;;  Pt educated throughout session about proper posture and technique with exercises. Improved exercise technique, movement at target joints, use of target muscles after min to mod verbal, visual, tactile cues. Pt requires direction and verbal cues for correct performance of standing dynamic balance exercises and strengthening exercises  PT Education - 10/12/18 1151    Education Details  HEP    Person(s) Educated  Patient    Methods  Explanation    Comprehension  Verbalized understanding;Returned demonstration;Tactile cues required;Need further instruction       PT Short Term Goals - 08/08/18 1503      PT SHORT TERM GOAL #1   Title  Patient will be independent in home exercise program to improve strength/mobility for better functional independence with ADLs.    Time  4    Period  Weeks    Status  Achieved    Target Date  07/03/18      PT SHORT TERM GOAL #2   Title  Patient will increase Berg Balance score by > 6 points to demonstrate decreased fall risk during functional activities.    Time  4    Period  Weeks    Status  Partially Met    Target Date   07/03/18        PT Long Term Goals - 10/05/18 1042      PT LONG TERM GOAL #1   Title   Patient will be independent with ascend/descend 12 steps using single UE in step over step pattern without LOB.    Baseline  needs BUE railings for 12 steps    Time  8    Period  Weeks    Status  Partially Met    Target Date  11/27/18      PT LONG TERM GOAL #2   Title  Patient will increase ABC scale score >80% to demonstrate better functional mobility and better confidence with ADLs.     Time  8    Status  Partially Met    Target Date  11/27/18      PT LONG TERM GOAL #3   Title  Patient will increase dynamic gait index score to >19/24 as to demonstrate reduced fall risk and improved dynamic gait balance for better safety with community/home ambulation.     Time  8    Period  Weeks    Status  Partially Met    Target Date  11/27/18            Plan - 10/12/18 1153    Clinical Impression Statement  Patient demonstrates LOB with standing balance exercises indicating decreased balancing strategies. Patient did require UE support to perform intermediate balance challenges.  Pt was encouraged to perform HEP during the week in order to continue progressing balance and strength interventions.  Pt would continue to benefit from skilled therapy services in order to further address LE strength deficits and balance deficits in order to decrease fall risk and improve mobility    Rehab Potential  Good    PT Frequency  2x / week    PT Duration  8 weeks    PT Treatment/Interventions  Gait training;Therapeutic exercise;Patient/family education;Neuromuscular re-education;Balance training    PT Next Visit Plan  balance and therapeutic exericse    PT Home Exercise Plan  hip abd standing,    Consulted and Agree with Plan of Care  Patient       Patient will benefit from skilled therapeutic intervention in order to improve the following deficits and impairments:  Abnormal gait, Decreased balance, Decreased  endurance, Decreased mobility, Difficulty walking, Pain, Decreased strength, Decreased activity tolerance  Visit Diagnosis: Other abnormalities of gait and mobility  Muscle weakness (generalized)  Difficulty in walking, not elsewhere classified     Problem List Patient Active Problem  List   Diagnosis Date Noted  . IBS (irritable bowel syndrome) 03/09/2018  . Hypokalemia 03/09/2018  . Age-related osteoporosis without current pathological fracture 03/09/2018  . Baker's cyst of knee, right 06/06/2017  . Insomnia 03/07/2017  . Tobacco abuse 12/01/2016  . Hypertension   . Hyperlipidemia   . MDD (major depressive disorder)   . Generalized anxiety disorder     Alanson Puls, [place] DPT 10/12/2018, 11:54 AM  [place] MAIN [facility] SERVICES [address] [place], [place], [address] Phone: [phone]   Fax:  [phone]  Name: Loren Vicens MRN: [id number] Date of Birth: [birth year]

## 2018-10-17 ENCOUNTER — Ambulatory Visit: Payer: Medicare Other | Admitting: Physical Therapy

## 2018-10-19 ENCOUNTER — Ambulatory Visit: Payer: Medicare Other | Admitting: Physical Therapy

## 2018-10-23 ENCOUNTER — Other Ambulatory Visit: Admission: RE | Admit: 2018-10-23 | Payer: Medicare Other | Source: Ambulatory Visit

## 2018-10-24 ENCOUNTER — Ambulatory Visit: Payer: Medicare Other | Admitting: Physical Therapy

## 2018-10-25 ENCOUNTER — Other Ambulatory Visit: Payer: Self-pay

## 2018-10-25 ENCOUNTER — Ambulatory Visit: Payer: Medicare Other | Admitting: Physical Therapy

## 2018-10-25 DIAGNOSIS — M6281 Muscle weakness (generalized): Secondary | ICD-10-CM | POA: Diagnosis not present

## 2018-10-25 DIAGNOSIS — R262 Difficulty in walking, not elsewhere classified: Secondary | ICD-10-CM

## 2018-10-25 DIAGNOSIS — R2689 Other abnormalities of gait and mobility: Secondary | ICD-10-CM

## 2018-10-25 NOTE — Therapy (Signed)
[place] MAIN [facility] SERVICES [address] [place], [place], [address] Phone: [phone]   Fax:  [phone]  Physical Therapy Treatment Physical Therapy Progress Note   Dates of reporting period  08/08/18   to   10/25/18 Patient Details  Name: Amanda Tucker MRN: [id number] Date of Birth: 1946/01/12 Referring Provider (PT): [name]   Encounter Date: 10/25/2018  PT End of Session - 10/25/18 1400    Visit Number  20    Number of Visits  33    Date for PT Re-Evaluation  11/27/18    PT Start Time  0145    PT Stop Time  0230    PT Time Calculation (min)  45 min    Equipment Utilized During Treatment  Gait belt    Activity Tolerance  Patient tolerated treatment well;Patient limited by fatigue    Behavior During Therapy  [facility] for tasks assessed/performed       Past Medical History:  Diagnosis Date  . Anxiety   . Arthritis   . Balance problem   . Depression   . Diabetes mellitus without complication ([place])   . Diverticulitis   . Environmental and seasonal allergies   . GERD (gastroesophageal reflux disease)   . HOH (hard of hearing)   . Hyperlipidemia   . Hypertension   . IBS (irritable bowel syndrome)   . Osteoporosis   . Right sided sciatica   . Vertigo     Past Surgical History:  Procedure Laterality Date  . CATARACT EXTRACTION W/PHACO Right 09/07/2018   Procedure: CATARACT EXTRACTION PHACO AND INTRAOCULAR LENS PLACEMENT;  Surgeon: Marchia Meiers, MD;  Location: [facility] ORS;  Service: Ophthalmology;  Laterality: Right;  [place]: 01:08.2 CDE: 11.02 Fluid Pack Lot # [id number]  . COLONOSCOPY WITH PROPOFOL N/A 02/27/2018   Procedure: COLONOSCOPY WITH PROPOFOL;  Surgeon: Jonathon Bellows, MD;  Location: [facility] ENDOSCOPY;  Service: Gastroenterology;  Laterality: N/A;  . NO PAST SURGERIES      There were no vitals filed for this visit.  Subjective Assessment - 10/25/18 1359    Subjective  Patient is feeling off balance and weakness iin BLE.She  has no new concerns.    Pertinent History  Patient was doing better after her last therapy visitis and now is feeling unsteady and weak again.she doesnt use a assistive device.    Limitations  Standing;Walking    Patient Stated Goals  to walk better    Currently in Pain?  No/denies    Pain Score  0-No pain    Pain Onset  Today       Neuromuscular Re-education  Tandem gait on 2"x4" without UE support x 2 lengths; Side stepping on 2"x4" without UE support x 2 lengths; Heel/toe raises without UE support 3s hold x 10 each; 1/2 foam roll balance with flat side up 30s x 2; 1/2 foam roll tandem balance alternating forward LE 30s x 2 each LE forward; Resisted walking forward/backward 12.5 lbs x10. CGA  Single leg press 45# x15 each side. Verbal cues to slow down movement.  Step ups to 6 inch stool x 10. No UE suport. CGA Eccentric step downs from 6 inch stool x 10 each LE. Visual cues for technique. Verbal cues to complete slow and tap heel.  BUE CGA Leg press 35 lbs x 20 x 3 , heel raises 35 lbs x 20 x 2   Pt educated throughout session about proper posture and technique with exercises. Improved exercise technique, movement at target  joints, use of target muscles after min to mod verbal, visual, tactile cues.                       PT Education - 10/25/18 1359    Education Details  HEP    Person(s) Educated  Patient    Methods  Explanation;Demonstration;Verbal cues    Comprehension  Verbalized understanding;Returned demonstration;Verbal cues required       PT Short Term Goals - 08/08/18 1503      PT SHORT TERM GOAL #1   Title  Patient will be independent in home exercise program to improve strength/mobility for better functional independence with ADLs.    Time  4    Period  Weeks    Status  Achieved    Target Date  07/03/18      PT SHORT TERM GOAL #2   Title  Patient will increase Berg Balance score by > 6 points to demonstrate decreased fall risk during  functional activities.    Time  4    Period  Weeks    Status  Partially Met    Target Date  07/03/18        PT Long Term Goals - 10/25/18 1400      PT LONG TERM GOAL #1   Title   Patient will be independent with ascend/descend 12 steps using single UE in step over step pattern without LOB.    Baseline  needs BUE railings for 12 steps    Time  8    Period  Weeks    Status  Partially Met    Target Date  11/27/18      PT LONG TERM GOAL #2   Title  Patient will increase ABC scale score >80% to demonstrate better functional mobility and better confidence with ADLs.     Baseline  10/25/18=75%    Time  8    Period  Weeks    Status  Partially Met    Target Date  11/27/18      PT LONG TERM GOAL #3   Title  Patient will increase dynamic gait index score to >19/24 as to demonstrate reduced fall risk and improved dynamic gait balance for better safety with community/home ambulation.     Baseline  10/25/18=20/24    Time  8    Period  Weeks    Status  Partially Met    Target Date  11/27/18            Plan - 10/25/18 1405    Clinical Impression Statement Patient's condition has the potential to improve in response to therapy. Maximum improvement is yet to be obtained. The anticipated improvement is attainable and reasonable in a generally predictable time.  Patient reports that she is getting more stability with her walking.  Pt presents with unsteadiness on uneven surfaces and fatigues with therapeutic exercises. Patient needs assist with instruction for balance with side stepping on uneven surfaces and needs CGA assist with balance activities. Patient demonstrates difficulty with side stepping. Patient tolerated all interventions well this date and will benefit from continued skilled PT interventions to improve strength and balance and decrease risk of falling    Rehab Potential  Good    PT Frequency  2x / week    PT Duration  8 weeks    PT Treatment/Interventions  Gait  training;Therapeutic exercise;Patient/family education;Neuromuscular re-education;Balance training    PT Next Visit Plan  balance and therapeutic exericse    PT  Home Exercise Plan  hip abd standing,    Consulted and Agree with Plan of Care  Patient       Patient will benefit from skilled therapeutic intervention in order to improve the following deficits and impairments:  Abnormal gait, Decreased balance, Decreased endurance, Decreased mobility, Difficulty walking, Pain, Decreased strength, Decreased activity tolerance  Visit Diagnosis: Other abnormalities of gait and mobility  Muscle weakness (generalized)  Difficulty in walking, not elsewhere classified     Problem List Patient Active Problem List   Diagnosis Date Noted  . IBS (irritable bowel syndrome) 03/09/2018  . Hypokalemia 03/09/2018  . Age-related osteoporosis without current pathological fracture 03/09/2018  . Baker's cyst of knee, right 06/06/2017  . Insomnia 03/07/2017  . Tobacco abuse 12/01/2016  . Hypertension   . Hyperlipidemia   . MDD (major depressive disorder)   . Generalized anxiety disorder     Arelia Sneddon SPT DPT 10/25/2018, 2:12 PM  [place] MAIN [facility] SERVICES [address] [place], [place], [address] Phone: [phone]   Fax:  [phone]  Name: Amanda Tucker MRN: [id number] Date of Birth: 1945-07-27

## 2018-10-26 ENCOUNTER — Ambulatory Visit: Payer: Medicare Other | Admitting: Physical Therapy

## 2018-10-31 ENCOUNTER — Ambulatory Visit: Payer: Medicare Other | Attending: Family Medicine | Admitting: Physical Therapy

## 2018-10-31 ENCOUNTER — Other Ambulatory Visit: Payer: Self-pay

## 2018-10-31 ENCOUNTER — Ambulatory Visit: Payer: Medicare Other | Admitting: Physical Therapy

## 2018-10-31 ENCOUNTER — Encounter: Payer: Self-pay | Admitting: Physical Therapy

## 2018-10-31 DIAGNOSIS — R2689 Other abnormalities of gait and mobility: Secondary | ICD-10-CM | POA: Diagnosis not present

## 2018-10-31 DIAGNOSIS — M6281 Muscle weakness (generalized): Secondary | ICD-10-CM | POA: Insufficient documentation

## 2018-10-31 DIAGNOSIS — R262 Difficulty in walking, not elsewhere classified: Secondary | ICD-10-CM | POA: Insufficient documentation

## 2018-10-31 NOTE — Therapy (Signed)
[place] MAIN [facility] SERVICES [address] [place], [place], [address] Phone: [phone]   Fax:  [phone]  Physical Therapy Treatment  Patient Details  Name: Amanda Tucker MRN: [id number] Date of Birth: March 04, 1945 Referring Provider (PT): [name]   Encounter Date: 10/31/2018  PT End of Session - 10/31/18 1354    Visit Number  21    Number of Visits  33    Date for PT Re-Evaluation  11/27/18    PT Start Time  1346    PT Stop Time  1430    PT Time Calculation (min)  44 min    Equipment Utilized During Treatment  Gait belt    Activity Tolerance  Patient tolerated treatment well;Patient limited by fatigue    Behavior During Therapy  [facility] for tasks assessed/performed       Past Medical History:  Diagnosis Date  . Anxiety   . Arthritis   . Balance problem   . Depression   . Diabetes mellitus without complication ([place])   . Diverticulitis   . Environmental and seasonal allergies   . GERD (gastroesophageal reflux disease)   . HOH (hard of hearing)   . Hyperlipidemia   . Hypertension   . IBS (irritable bowel syndrome)   . Osteoporosis   . Right sided sciatica   . Vertigo     Past Surgical History:  Procedure Laterality Date  . CATARACT EXTRACTION W/PHACO Right 09/07/2018   Procedure: CATARACT EXTRACTION PHACO AND INTRAOCULAR LENS PLACEMENT;  Surgeon: Marchia Meiers, MD;  Location: [facility] ORS;  Service: Ophthalmology;  Laterality: Right;  [place]: 01:08.2 CDE: 11.02 Fluid Pack Lot # [id number]  . COLONOSCOPY WITH PROPOFOL N/A 02/27/2018   Procedure: COLONOSCOPY WITH PROPOFOL;  Surgeon: Jonathon Bellows, MD;  Location: [facility] ENDOSCOPY;  Service: Gastroenterology;  Laterality: N/A;  . NO PAST SURGERIES      There were no vitals filed for this visit.  Neuro Re-ed: Patient requires CGA during all standing interventions due to limited stability and patient fear of LOB. Cueing for reduction of UE support required as well as postural alignment  for optimal stability within COM  Subjective Assessment - 10/31/18 1447    Subjective  Patient states she is doing well today. Claims her right knee is a little stiff this afternoon. No new falls since last session.    Pertinent History  Patient was doing better after her last therapy visitis and now is feeling unsteady and weak again.she doesnt use a assistive device.    Limitations  Standing;Walking    Patient Stated Goals  to walk better    Currently in Pain?  No/denies    Pain Score  0-No pain    Pain Onset  Today     Warmup: 5 min Nustep level 0  This entire session was performed under direct supervision and direction of a licensed therapist/therapist assistant . I have personally read, edited and approve of the note as written. NMRE:  -Tandem gait along 2"x4" with orange hurdle halfway, x2 laps  -Side stepping along 2"x4" with orange hurdle halfway, x2 laps; demonstration and min VC for step-over sequencing; occasional reaching for rail support  -Heel/toe raises x10 with 3 sec hold; less stable static balance with toe lift with occasional UE support  -Standing on airex, toe taps to 6" step x20 alternating LE; frequent LOB with mod rail assist.   Regressed to standing on firm ground, toe taps to 6"  step x20 with only occasional rail support.  -  Side stepping on long foam x2 laps   -Side stepping on long foam x2 laps with orange hurdle halfway; demonstration and min VC for step-over sequencing; occasional reaching for rail support which improved with repetition  -Fwd/bwd walking on matrix 12.5# x5 laps; cues to go slowly with a wide stance going backwards in order to decrease LOB episodes  -Side/side walking on matrix 12.5# x2 laps each direction; more difficult walking toward right side, cues to go slowly with a wide stance to prevent LOB   -Leg press x20 35#  -Calf raises on leg press x20 35#  Pt educated throughout session about proper posture and technique with exercises.  Improved exercise technique, movement at target joints, use of target muscles after min to mod verbal, visual, tactile cues.   Patient demonstrates excellent motivation throughout today's session. Patient was able to perform balance interventions on narrow and compliant surfaces with minimal use of rail support, required demonstrations and min VC for obstacle negotiation sequencing. Patient continues to be challenged by LOB during resisted walk-outs, namely with walking backwards and side to side, which improved with weight distribution cuing and demonstrations for a wider stance.  PT Education - 10/31/18 1354    Education Details  HEP    Person(s) Educated  Patient    Methods  Explanation;Demonstration;Verbal cues    Comprehension  Verbalized understanding;Returned demonstration;Verbal cues required       PT Short Term Goals - 08/08/18 1503      PT SHORT TERM GOAL #1   Title  Patient will be independent in home exercise program to improve strength/mobility for better functional independence with ADLs.    Time  4    Period  Weeks    Status  Achieved    Target Date  07/03/18      PT SHORT TERM GOAL #2   Title  Patient will increase Berg Balance score by > 6 points to demonstrate decreased fall risk during functional activities.    Time  4    Period  Weeks    Status  Partially Met    Target Date  07/03/18        PT Long Term Goals - 10/25/18 1400      PT LONG TERM GOAL #1   Title   Patient will be independent with ascend/descend 12 steps using single UE in step over step pattern without LOB.    Baseline  needs BUE railings for 12 steps    Time  8    Period  Weeks    Status  Partially Met    Target Date  11/27/18      PT LONG TERM GOAL #2   Title  Patient will increase ABC scale score >80% to demonstrate better functional mobility and better confidence with ADLs.     Baseline  10/25/18=75%    Time  8    Period  Weeks    Status  Partially Met    Target Date  11/27/18       PT LONG TERM GOAL #3   Title  Patient will increase dynamic gait index score to >19/24 as to demonstrate reduced fall risk and improved dynamic gait balance for better safety with community/home ambulation.     Baseline  10/25/18=20/24    Time  8    Period  Weeks    Status  Partially Met    Target Date  11/27/18            Plan - 10/31/18 1442  Clinical Impression Statement  Patient presents with excellent motivation during today's session. She continues to fatigue following balance interventions and therapeutic exercises. She demonstrated good obstacle negotiation strategies, requiring only min verbal and visual cues to ensure large enough step over hurdle for safety. She continues to be challenged by static and dynamic balance activities on compliant surfaces with occasional rail support and resisted walk outs, requiring min VC and demonstrations for proper weight shift and stance to prevent loss of balance. She would continue to benefit from skilledPT to address the deficits outlined in this note.    Rehab Potential  Good    PT Frequency  2x / week    PT Duration  8 weeks    PT Treatment/Interventions  Gait training;Therapeutic exercise;Patient/family education;Neuromuscular re-education;Balance training    PT Next Visit Plan  balance and therapeutic exericse    PT Home Exercise Plan  hip abd standing,    Consulted and Agree with Plan of Care  Patient       Patient will benefit from skilled therapeutic intervention in order to improve the following deficits and impairments:  Abnormal gait, Decreased balance, Decreased endurance, Decreased mobility, Difficulty walking, Pain, Decreased strength, Decreased activity tolerance  Visit Diagnosis: Other abnormalities of gait and mobility  Muscle weakness (generalized)  Difficulty in walking, not elsewhere classified     Problem List Patient Active Problem List   Diagnosis Date Noted  . IBS (irritable bowel syndrome) 03/09/2018   . Hypokalemia 03/09/2018  . Age-related osteoporosis without current pathological fracture 03/09/2018  . Baker's cyst of knee, right 06/06/2017  . Insomnia 03/07/2017  . Tobacco abuse 12/01/2016  . Hypertension   . Hyperlipidemia   . MDD (major depressive disorder)   . Generalized anxiety disorder    Joyice Magda A. [address], SPT  [place], [place], [place] DPT 11/01/2018, 10:54 AM  [place] MAIN [facility] SERVICES [address] [place], [place], [address] Phone: [phone]   Fax:  [phone]  Name: Amanda Tucker MRN: [id number] Date of Birth: 1945/08/10

## 2018-11-02 ENCOUNTER — Ambulatory Visit: Payer: Medicare Other | Admitting: Physical Therapy

## 2018-11-02 ENCOUNTER — Other Ambulatory Visit: Payer: Self-pay

## 2018-11-02 DIAGNOSIS — R2689 Other abnormalities of gait and mobility: Secondary | ICD-10-CM

## 2018-11-02 DIAGNOSIS — R262 Difficulty in walking, not elsewhere classified: Secondary | ICD-10-CM | POA: Diagnosis not present

## 2018-11-02 DIAGNOSIS — M6281 Muscle weakness (generalized): Secondary | ICD-10-CM

## 2018-11-02 NOTE — Therapy (Signed)
[place] MAIN [facility] SERVICES [address] [place], [place], [address] Phone: [phone]   Fax:  [phone]  Physical Therapy Treatment  Patient Details  Name: Amanda Tucker MRN: [id number] Date of Birth: [birth year] Referring Provider (PT): [name]   Encounter Date: 11/02/2018  PT End of Session - 11/02/18 1527    Visit Number  22    Number of Visits  33    Date for PT Re-Evaluation  11/27/18    PT Start Time  0354    PT Stop Time  1515    PT Time Calculation (min)  43 min    Equipment Utilized During Treatment  Gait belt    Activity Tolerance  Patient tolerated treatment well;Patient limited by fatigue    Behavior During Therapy  [facility] for tasks assessed/performed       Past Medical History:  Diagnosis Date  . Anxiety   . Arthritis   . Balance problem   . Depression   . Diabetes mellitus without complication ([place])   . Diverticulitis   . Environmental and seasonal allergies   . GERD (gastroesophageal reflux disease)   . HOH (hard of hearing)   . Hyperlipidemia   . Hypertension   . IBS (irritable bowel syndrome)   . Osteoporosis   . Right sided sciatica   . Vertigo     Past Surgical History:  Procedure Laterality Date  . CATARACT EXTRACTION W/PHACO Right 09/07/2018   Procedure: CATARACT EXTRACTION PHACO AND INTRAOCULAR LENS PLACEMENT;  Surgeon: Marchia Meiers, MD;  Location: [facility] ORS;  Service: Ophthalmology;  Laterality: Right;  [place]: 01:08.2 CDE: 11.02 Fluid Pack Lot # [id number]  . COLONOSCOPY WITH PROPOFOL N/A 02/27/2018   Procedure: COLONOSCOPY WITH PROPOFOL;  Surgeon: Jonathon Bellows, MD;  Location: [facility] ENDOSCOPY;  Service: Gastroenterology;  Laterality: N/A;  . NO PAST SURGERIES      There were no vitals filed for this visit.  Subjective Assessment - 11/02/18 1525    Subjective  Patient reports that she is having some right leg pain from helping her daughter move around in bed.    Pertinent History  Patient was  doing better after her last therapy visitis and now is feeling unsteady and weak again.she doesnt use a assistive device.    Limitations  Standing;Walking    Patient Stated Goals  to walk better    Currently in Pain?  Yes    Pain Score  4     Pain Location  Leg    Pain Orientation  Right    Pain Descriptors / Indicators  Aching    Pain Type  Acute pain    Pain Radiating Towards  ankle    Pain Onset  Today    Pain Frequency  Rarely    Aggravating Factors   standing    Pain Relieving Factors  rest    Effect of Pain on Daily Activities  hard to do activiites       Ther-ex Nu-step x 5 mins L 2 SAQ with 3 lbs x 20 BLE with 3 sec hold  Quantum leg press45# x 20,cues for correct technique Step-ups to 6' step without UE support alternating LE x 10 each; Standing hip ext with YTB x 15 x 2 BLE  Standing hip abd with YTB x 15 x 2 BLE  SLR x 10 x 2 BLE, cues for correct height and speed  Neuromuscular Re-education 1/2 foam flat side down x 2 mins , UE assist  Lunges left  and right x 10 Toe taps to 6" step without UE support alternating LE x10each  1/2 foam roll (round side up) static tandem balance alternating forward LE x 30s each;  Pt educated throughout session about proper posture and technique with exercises. Improved exercise technique, movement at target joints, use of target muscles after min to mod verbal, visual, tactile cues. Pt requires direction and verbal cues for correct performance of standing dynamic balance exercises and strengthening exercises                        PT Education - 11/02/18 1527    Education Details  HEP    Person(s) Educated  Patient    Methods  Explanation;Demonstration    Comprehension  Verbalized understanding;Returned demonstration;Tactile cues required;Need further instruction       PT Short Term Goals - 08/08/18 1503      PT SHORT TERM GOAL #1   Title  Patient will be independent in home exercise program to  improve strength/mobility for better functional independence with ADLs.    Time  4    Period  Weeks    Status  Achieved    Target Date  07/03/18      PT SHORT TERM GOAL #2   Title  Patient will increase Berg Balance score by > 6 points to demonstrate decreased fall risk during functional activities.    Time  4    Period  Weeks    Status  Partially Met    Target Date  07/03/18        PT Long Term Goals - 10/25/18 1400      PT LONG TERM GOAL #1   Title   Patient will be independent with ascend/descend 12 steps using single UE in step over step pattern without LOB.    Baseline  needs BUE railings for 12 steps    Time  8    Period  Weeks    Status  Partially Met    Target Date  11/27/18      PT LONG TERM GOAL #2   Title  Patient will increase ABC scale score >80% to demonstrate better functional mobility and better confidence with ADLs.     Baseline  10/25/18=75%    Time  8    Period  Weeks    Status  Partially Met    Target Date  11/27/18      PT LONG TERM GOAL #3   Title  Patient will increase dynamic gait index score to >19/24 as to demonstrate reduced fall risk and improved dynamic gait balance for better safety with community/home ambulation.     Baseline  10/25/18=20/24    Time  8    Period  Weeks    Status  Partially Met    Target Date  11/27/18            Plan - 11/02/18 1528    Clinical Impression Statement  Patient demonstrates LE and core weakness, and impaired mobility and lack of LE strength that is causing difficulty with gait activities.  Patient still fatigues quickly to due very weak quad muscles. Patient is able to perform exercises that were difficult several weeks ago. Patient will continue to benefit from skilled PT to improve gait and safety.    Rehab Potential  Good    PT Frequency  2x / week    PT Duration  8 weeks    PT Treatment/Interventions  Gait training;Therapeutic exercise;Patient/family  education;Neuromuscular re-education;Balance  training    PT Next Visit Plan  balance and therapeutic exericse    PT Home Exercise Plan  hip abd standing,    Consulted and Agree with Plan of Care  Patient       Patient will benefit from skilled therapeutic intervention in order to improve the following deficits and impairments:  Abnormal gait, Decreased balance, Decreased endurance, Decreased mobility, Difficulty walking, Pain, Decreased strength, Decreased activity tolerance  Visit Diagnosis: Other abnormalities of gait and mobility  Muscle weakness (generalized)  Difficulty in walking, not elsewhere classified     Problem List Patient Active Problem List   Diagnosis Date Noted  . IBS (irritable bowel syndrome) 03/09/2018  . Hypokalemia 03/09/2018  . Age-related osteoporosis without current pathological fracture 03/09/2018  . Baker's cyst of knee, right 06/06/2017  . Insomnia 03/07/2017  . Tobacco abuse 12/01/2016  . Hypertension   . Hyperlipidemia   . MDD (major depressive disorder)   . Generalized anxiety disorder     Alanson Puls, [place] DPT 11/02/2018, 3:29 PM  [place] MAIN [facility] SERVICES [address] [place], [place], [address] Phone: [phone]   Fax:  [phone]  Name: Debhora Titus MRN: [id number] Date of Birth: 16-Aug-1945

## 2018-11-06 ENCOUNTER — Ambulatory Visit: Payer: Medicare Other | Admitting: Physical Therapy

## 2018-11-08 ENCOUNTER — Ambulatory Visit: Payer: Medicare Other | Admitting: Physical Therapy

## 2018-11-08 ENCOUNTER — Encounter: Payer: Self-pay | Admitting: Physical Therapy

## 2018-11-08 ENCOUNTER — Other Ambulatory Visit: Payer: Self-pay

## 2018-11-08 DIAGNOSIS — R2689 Other abnormalities of gait and mobility: Secondary | ICD-10-CM

## 2018-11-08 DIAGNOSIS — R262 Difficulty in walking, not elsewhere classified: Secondary | ICD-10-CM | POA: Diagnosis not present

## 2018-11-08 DIAGNOSIS — M6281 Muscle weakness (generalized): Secondary | ICD-10-CM

## 2018-11-08 NOTE — Therapy (Signed)
[place] MAIN [facility] SERVICES [address] [place], [place], [address] Phone: [phone]   Fax:  [phone]  Physical Therapy Treatment  Patient Details  Name: Amanda Tucker MRN: [id number] Date of Birth: Mar 03, 1945 Referring Provider (PT): [name]   Encounter Date: 11/08/2018  PT End of Session - 11/08/18 1514    Visit Number  23    PT Start Time  1430    PT Stop Time  1512    PT Time Calculation (min)  42 min       Past Medical History:  Diagnosis Date  . Anxiety   . Arthritis   . Balance problem   . Depression   . Diabetes mellitus without complication ([place])   . Diverticulitis   . Environmental and seasonal allergies   . GERD (gastroesophageal reflux disease)   . HOH (hard of hearing)   . Hyperlipidemia   . Hypertension   . IBS (irritable bowel syndrome)   . Osteoporosis   . Right sided sciatica   . Vertigo     Past Surgical History:  Procedure Laterality Date  . CATARACT EXTRACTION W/PHACO Right 09/07/2018   Procedure: CATARACT EXTRACTION PHACO AND INTRAOCULAR LENS PLACEMENT;  Surgeon: Marchia Meiers, MD;  Location: [facility] ORS;  Service: Ophthalmology;  Laterality: Right;  [place]: 01:08.2 CDE: 11.02 Fluid Pack Lot # [id number]  . COLONOSCOPY WITH PROPOFOL N/A 02/27/2018   Procedure: COLONOSCOPY WITH PROPOFOL;  Surgeon: Jonathon Bellows, MD;  Location: [facility] ENDOSCOPY;  Service: Gastroenterology;  Laterality: N/A;  . NO PAST SURGERIES      There were no vitals filed for this visit.  Subjective Assessment - 11/08/18 1502    Subjective  Patient reports that she is having some right leg pain from helping her daughter move around in bed.    Pertinent History  Patient was doing better after her last therapy visitis and now is feeling unsteady and weak again.she doesnt use a assistive device.    Limitations  Standing;Walking    Patient Stated Goals  to walk better    Currently in Pain?  No/denies    Pain Score  0-No pain    Pain Onset  Today       Treatment:  Side stepping on blue foam 4 laps with cGA Step stepping 4 square, fwd/bwd , diagonals x 10 in four square Purple foam to floor side stepping x 10 left and 10 right  Matrix x 3 laps 22. 5 lbs  Fwd/bwd Standing on purple foam with head turns, trunk rotation x 20 each Leg press 35 lbs x 20 x 3  Heel raises x 20  Stepping over hurdle fwd/bwd and side to side x 10 x 2  2 x 4 stepping sideways left and right x 3 laps, fwd x 3 laps with CGA    Pt educated throughout session about proper posture and technique with exercises. Improved exercise technique, movement at target joints, use of target muscles after min to mod verbal, visual, tactile cues.                       PT Education - 11/08/18 1503    Education Details  HEP    Person(s) Educated  Patient    Methods  Explanation    Comprehension  Verbalized understanding;Returned demonstration;Tactile cues required       PT Short Term Goals - 08/08/18 1503      PT SHORT TERM GOAL #1   Title  Patient will be independent in home exercise program to improve strength/mobility for better functional independence with ADLs.    Time  4    Period  Weeks    Status  Achieved    Target Date  07/03/18      PT SHORT TERM GOAL #2   Title  Patient will increase Berg Balance score by > 6 points to demonstrate decreased fall risk during functional activities.    Time  4    Period  Weeks    Status  Partially Met    Target Date  07/03/18        PT Long Term Goals - 10/25/18 1400      PT LONG TERM GOAL #1   Title   Patient will be independent with ascend/descend 12 steps using single UE in step over step pattern without LOB.    Baseline  needs BUE railings for 12 steps    Time  8    Period  Weeks    Status  Partially Met    Target Date  11/27/18      PT LONG TERM GOAL #2   Title  Patient will increase ABC scale score >80% to demonstrate better functional mobility and better confidence  with ADLs.     Baseline  10/25/18=75%    Time  8    Period  Weeks    Status  Partially Met    Target Date  11/27/18      PT LONG TERM GOAL #3   Title  Patient will increase dynamic gait index score to >19/24 as to demonstrate reduced fall risk and improved dynamic gait balance for better safety with community/home ambulation.     Baseline  10/25/18=20/24    Time  8    Period  Weeks    Status  Partially Met    Target Date  11/27/18            Plan - 11/08/18 1515    Clinical Impression Statement  Pt requires direction and verbal cues for correct performance of gait and strengthening exercises. Patient has fatigue with endurance and difficulty with longer standing tasks.  Patient struggles with posture and ability to perform many repetitions due to weakness, as well as balance with mobility.  Pt encouraged continuing HEP   Patient will benefit from continued skilled PT to improve mobility and safety.    Rehab Potential  Good    PT Frequency  2x / week    PT Duration  8 weeks    PT Treatment/Interventions  Gait training;Therapeutic exercise;Patient/family education;Neuromuscular re-education;Balance training    PT Next Visit Plan  balance and therapeutic exericse    PT Home Exercise Plan  hip abd standing,    Consulted and Agree with Plan of Care  Patient       Patient will benefit from skilled therapeutic intervention in order to improve the following deficits and impairments:  Abnormal gait, Decreased balance, Decreased endurance, Decreased mobility, Difficulty walking, Pain, Decreased strength, Decreased activity tolerance  Visit Diagnosis: Other abnormalities of gait and mobility  Muscle weakness (generalized)  Difficulty in walking, not elsewhere classified     Problem List Patient Active Problem List   Diagnosis Date Noted  . IBS (irritable bowel syndrome) 03/09/2018  . Hypokalemia 03/09/2018  . Age-related osteoporosis without current pathological fracture  03/09/2018  . Baker's cyst of knee, right 06/06/2017  . Insomnia 03/07/2017  . Tobacco abuse 12/01/2016  . Hypertension   . Hyperlipidemia   .  MDD (major depressive disorder)   . Generalized anxiety disorder     Faylene Kurtz DPT 11/08/2018, 3:16 PM  [place] MAIN [facility] SERVICES [address] [place], [place], [address] Phone: [phone]   Fax:  [phone]  Name: Amanda Tucker MRN: [id number] Date of Birth: [birth year]

## 2018-11-13 ENCOUNTER — Ambulatory Visit: Payer: Medicare Other | Admitting: Physical Therapy

## 2018-11-13 ENCOUNTER — Encounter: Payer: Self-pay | Admitting: Physical Therapy

## 2018-11-13 ENCOUNTER — Other Ambulatory Visit: Payer: Self-pay

## 2018-11-13 DIAGNOSIS — R2689 Other abnormalities of gait and mobility: Secondary | ICD-10-CM | POA: Diagnosis not present

## 2018-11-13 DIAGNOSIS — M6281 Muscle weakness (generalized): Secondary | ICD-10-CM | POA: Diagnosis not present

## 2018-11-13 DIAGNOSIS — R262 Difficulty in walking, not elsewhere classified: Secondary | ICD-10-CM | POA: Diagnosis not present

## 2018-11-13 NOTE — Therapy (Signed)
[place] MAIN [facility] SERVICES [address] [place], [place], [address] Phone: [phone]   Fax:  [phone]  Physical Therapy Treatment  Patient Details  Name: Amanda Tucker MRN: [id number] Date of Birth: 1945-06-25 Referring Provider (PT): [name]   Encounter Date: 11/13/2018    Past Medical History:  Diagnosis Date  . Anxiety   . Arthritis   . Balance problem   . Depression   . Diabetes mellitus without complication ([place])   . Diverticulitis   . Environmental and seasonal allergies   . GERD (gastroesophageal reflux disease)   . HOH (hard of hearing)   . Hyperlipidemia   . Hypertension   . IBS (irritable bowel syndrome)   . Osteoporosis   . Right sided sciatica   . Vertigo     Past Surgical History:  Procedure Laterality Date  . CATARACT EXTRACTION W/PHACO Right 09/07/2018   Procedure: CATARACT EXTRACTION PHACO AND INTRAOCULAR LENS PLACEMENT;  Surgeon: Marchia Meiers, MD;  Location: [facility] ORS;  Service: Ophthalmology;  Laterality: Right;  [place]: 01:08.2 CDE: 11.02 Fluid Pack Lot # [id number]  . COLONOSCOPY WITH PROPOFOL N/A 02/27/2018   Procedure: COLONOSCOPY WITH PROPOFOL;  Surgeon: Jonathon Bellows, MD;  Location: [facility] ENDOSCOPY;  Service: Gastroenterology;  Laterality: N/A;  . NO PAST SURGERIES      There were no vitals filed for this visit.    Neuro Re-ed: Patient requires CGA during all standing interventions due to limited stability and patient fear of LOB. Cueing for reduction of UE support required as well as postural alignment for optimal stability within COM  This entire session was performed under direct supervision and direction of a licensed therapist/therapist assistant . I have personally read, edited and approve of the note as written.  Treatment:   Ther-ex  Nu-step x 5 mins L 2 Quantum leg press 45 #  2x20 Calf raises 45# 2x20 Step-ups to 6' step without UE support alternating LE x 10 each; LAQ with 3#  ankle weight x 20 BLE with 3 sec hold  Standing hip ext with 3# ankle weight 1x20 BLE  Standing hip abd with 3# ankle weight 1x20 BLE Standing marches with 3# ankle weight x20   Neuromuscular Re-education  Purple foam to floor side stepping x 10 left and 10 right Toe taps to 6" step from purple airex foam without UE support alternating LE x 10 each  Step ups to 6" step from purple foam x20 alternating without UE support Lunge to bosu ball x10 BLE Lunge stance with bosu ball, head turns x10; cues to slow down head turns to maintain balance Lunge to bosu ball with trunk rotations x10 holding rainbow ball; cues to slow down and face shoulders in direction of turn 1/2 foam roll (round side up) static tandem balance alternating forward LE x 30s each;   Pt educated throughout session about proper posture and technique with exercises. Improved exercise technique, movement at target joints, use of target muscles after min to mod verbal, visual, tactile cues.   Patient demonstrates excellent motivation throughout today's session. Patient was able to perform advanced balance intervention with occasional UE assist, required min VC for sequencing and to slow down dynamic movements for improved stability. Patient continues to be challenged by maintaining a narrow BOS, SLS time, and balancing on a compliant surface, which improved with repetition, demonstration, and min VC.           PT Short Term Goals - 08/08/18 1503  PT SHORT TERM GOAL #1   Title  Patient will be independent in home exercise program to improve strength/mobility for better functional independence with ADLs.    Time  4    Period  Weeks    Status  Achieved    Target Date  07/03/18      PT SHORT TERM GOAL #2   Title  Patient will increase Berg Balance score by > 6 points to demonstrate decreased fall risk during functional activities.    Time  4    Period  Weeks    Status  Partially Met    Target Date  07/03/18         PT Long Term Goals - 10/25/18 1400      PT LONG TERM GOAL #1   Title   Patient will be independent with ascend/descend 12 steps using single UE in step over step pattern without LOB.    Baseline  needs BUE railings for 12 steps    Time  8    Period  Weeks    Status  Partially Met    Target Date  11/27/18      PT LONG TERM GOAL #2   Title  Patient will increase ABC scale score >80% to demonstrate better functional mobility and better confidence with ADLs.     Baseline  10/25/18=75%    Time  8    Period  Weeks    Status  Partially Met    Target Date  11/27/18      PT LONG TERM GOAL #3   Title  Patient will increase dynamic gait index score to >19/24 as to demonstrate reduced fall risk and improved dynamic gait balance for better safety with community/home ambulation.     Baseline  10/25/18=20/24    Time  8    Period  Weeks    Status  Partially Met    Target Date  11/27/18              Patient will benefit from skilled therapeutic intervention in order to improve the following deficits and impairments:     Visit Diagnosis: No diagnosis found.     Problem List Patient Active Problem List   Diagnosis Date Noted  . IBS (irritable bowel syndrome) 03/09/2018  . Hypokalemia 03/09/2018  . Age-related osteoporosis without current pathological fracture 03/09/2018  . Baker's cyst of knee, right 06/06/2017  . Insomnia 03/07/2017  . Tobacco abuse 12/01/2016  . Hypertension   . Hyperlipidemia   . MDD (major depressive disorder)   . Generalized anxiety disorder    Onofre Gains A. Rosana Hoes, SPT  Alean Rinne 11/13/2018, 2:32 PM Alanson Puls, PT, DPT [place] MAIN [facility] SERVICES [address] [place], [place], [address] Phone: [phone]   Fax:  [phone]  Name: Amanda Tucker MRN: [id number] Date of Birth: August 02, 1945

## 2018-11-15 ENCOUNTER — Ambulatory Visit: Payer: Medicare Other | Admitting: Physical Therapy

## 2018-11-15 ENCOUNTER — Other Ambulatory Visit: Payer: Self-pay

## 2018-11-15 ENCOUNTER — Encounter: Payer: Self-pay | Admitting: Physical Therapy

## 2018-11-15 DIAGNOSIS — M6281 Muscle weakness (generalized): Secondary | ICD-10-CM | POA: Diagnosis not present

## 2018-11-15 DIAGNOSIS — R262 Difficulty in walking, not elsewhere classified: Secondary | ICD-10-CM | POA: Diagnosis not present

## 2018-11-15 DIAGNOSIS — R2689 Other abnormalities of gait and mobility: Secondary | ICD-10-CM

## 2018-11-15 NOTE — Therapy (Signed)
[place] MAIN [facility] SERVICES [address] [place], [place], [address] Phone: [phone]   Fax:  [phone]  Physical Therapy Treatment  Patient Details  Name: Amanda Tucker MRN: [id number] Date of Birth: [birth year] Referring Provider (PT): [name]   Encounter Date: 11/15/2018  PT End of Session - 11/15/18 1458    Visit Number  25    Number of Visits  33    Date for PT Re-Evaluation  11/27/18    PT Start Time  1430    PT Stop Time  1510    PT Time Calculation (min)  40 min    Equipment Utilized During Treatment  Gait belt    Activity Tolerance  Patient tolerated treatment well;Patient limited by fatigue    Behavior During Therapy  WFL for tasks assessed/performed       Past Medical History:  Diagnosis Date  . Anxiety   . Arthritis   . Balance problem   . Depression   . Diabetes mellitus without complication ([place])   . Diverticulitis   . Environmental and seasonal allergies   . GERD (gastroesophageal reflux disease)   . HOH (hard of hearing)   . Hyperlipidemia   . Hypertension   . IBS (irritable bowel syndrome)   . Osteoporosis   . Right sided sciatica   . Vertigo     Past Surgical History:  Procedure Laterality Date  . CATARACT EXTRACTION W/PHACO Right 09/07/2018   Procedure: CATARACT EXTRACTION PHACO AND INTRAOCULAR LENS PLACEMENT;  Surgeon: Marchia Meiers, MD;  Location: [facility] ORS;  Service: Ophthalmology;  Laterality: Right;  [place]: 01:08.2 CDE: 11.02 Fluid Pack Lot # [id number]  . COLONOSCOPY WITH PROPOFOL N/A 02/27/2018   Procedure: COLONOSCOPY WITH PROPOFOL;  Surgeon: Jonathon Bellows, MD;  Location: [facility] ENDOSCOPY;  Service: Gastroenterology;  Laterality: N/A;  . NO PAST SURGERIES      There were no vitals filed for this visit.  Subjective Assessment - 11/15/18 1455    Subjective  Patient is doing well today, reports minimal pain in her right knee.    Pertinent History  Patient was doing better after her last  therapy visitis and now is feeling unsteady and weak again.she doesnt use a assistive device.    Limitations  Standing;Walking    Patient Stated Goals  to walk better    Currently in Pain?  Yes    Pain Score  1     Pain Location  Knee    Pain Orientation  Right    Pain Descriptors / Indicators  Aching    Pain Type  Chronic pain    Pain Onset  Today    Aggravating Factors   standing    Pain Relieving Factors  rest    Effect of Pain on Daily Activities  difficult for steps       Neuromuscular Re-education  Rocker board fwd/bwd, side to side x 20 each direction Tandem gait on 2"x4" without UE support x 2 lengths Side stepping on 2"x4" without UE support x 2 lengths Heel/toe raises without UE support 3s hold x 10 each 1/2 foam roll balance with flat side up 30s x 2 reps 1/2 foam roll balance with flat side down 30s x 2 reps 1/2 foam roll tandem balance alternating forward LE 30s x 2 each LE forward Lateral side steps from foam to 6 inch stool left and right x 15 Backwards stepping from foam to 6 inch stool x 15  Four [place] fwd/bwd, side to side ,  diagonal x 10 ,cues for posture and stepping strategies, occasional LOB Star stepping left and right x 10       Pt educated throughout session about proper posture and technique with exercises. Improved exercise technique, movement at target joints, use of target muscles after min to mod verbal, visual, tactile cues. CGA and Min to mod verbal cues used throughout with increased in postural sway and LOB most seen with narrow base of support and while on uneven surfaces. Continues to have balance deficits typical with diagnosis. Patient performs intermediate level exercises without pain behaviors and needs verbal cuing for postural alignment and head positioning Tactile cues and assistance needed to keep lower leg and knee in neutral to avoid compensations with ankle motions.                        PT Education - 11/15/18  1457    Education Details  hep    Person(s) Educated  Patient    Methods  Explanation    Comprehension  Verbalized understanding;Returned demonstration;Need further instruction       PT Short Term Goals - 08/08/18 1503      PT SHORT TERM GOAL #1   Title  Patient will be independent in home exercise program to improve strength/mobility for better functional independence with ADLs.    Time  4    Period  Weeks    Status  Achieved    Target Date  07/03/18      PT SHORT TERM GOAL #2   Title  Patient will increase Berg Balance score by > 6 points to demonstrate decreased fall risk during functional activities.    Time  4    Period  Weeks    Status  Partially Met    Target Date  07/03/18        PT Long Term Goals - 10/25/18 1400      PT LONG TERM GOAL #1   Title   Patient will be independent with ascend/descend 12 steps using single UE in step over step pattern without LOB.    Baseline  needs BUE railings for 12 steps    Time  8    Period  Weeks    Status  Partially Met    Target Date  11/27/18      PT LONG TERM GOAL #2   Title  Patient will increase ABC scale score >80% to demonstrate better functional mobility and better confidence with ADLs.     Baseline  10/25/18=75%    Time  8    Period  Weeks    Status  Partially Met    Target Date  11/27/18      PT LONG TERM GOAL #3   Title  Patient will increase dynamic gait index score to >19/24 as to demonstrate reduced fall risk and improved dynamic gait balance for better safety with community/home ambulation.     Baseline  10/25/18=20/24    Time  8    Period  Weeks    Status  Partially Met    Target Date  11/27/18            Plan - 11/15/18 1459    Clinical Impression Statement  Pt demonstrates increased postural sway when standing on uneven surface and requires // bars to steady, and demonstrates fatigue at end of set of exercises focused on strength and endurance.  Patient will continue to benefit from skilled PT for  improved balance and  strength.   Rehab Potential  Good    PT Frequency  2x / week    PT Duration  8 weeks    PT Treatment/Interventions  Gait training;Therapeutic exercise;Patient/family education;Neuromuscular re-education;Balance training    PT Next Visit Plan  balance and therapeutic exericse    PT Home Exercise Plan  hip abd standing,    Consulted and Agree with Plan of Care  Patient       Patient will benefit from skilled therapeutic intervention in order to improve the following deficits and impairments:  Abnormal gait, Decreased balance, Decreased endurance, Decreased mobility, Difficulty walking, Pain, Decreased strength, Decreased activity tolerance  Visit Diagnosis: Other abnormalities of gait and mobility  Muscle weakness (generalized)  Difficulty in walking, not elsewhere classified     Problem List Patient Active Problem List   Diagnosis Date Noted  . IBS (irritable bowel syndrome) 03/09/2018  . Hypokalemia 03/09/2018  . Age-related osteoporosis without current pathological fracture 03/09/2018  . Baker's cyst of knee, right 06/06/2017  . Insomnia 03/07/2017  . Tobacco abuse 12/01/2016  . Hypertension   . Hyperlipidemia   . MDD (major depressive disorder)   . Generalized anxiety disorder     Alanson Puls, [place], DPT 11/15/2018, 3:01 PM  [place] MAIN [facility] SERVICES [address] [place], [place], [address] Phone: [phone]   Fax:  [phone]  Name: Amanda Tucker MRN: [id number] Date of Birth: [birth year]

## 2018-11-20 ENCOUNTER — Ambulatory Visit: Payer: Medicare Other | Admitting: Physical Therapy

## 2018-11-20 ENCOUNTER — Other Ambulatory Visit: Payer: Self-pay

## 2018-11-20 ENCOUNTER — Encounter: Payer: Self-pay | Admitting: Physical Therapy

## 2018-11-20 DIAGNOSIS — R2689 Other abnormalities of gait and mobility: Secondary | ICD-10-CM

## 2018-11-20 DIAGNOSIS — R262 Difficulty in walking, not elsewhere classified: Secondary | ICD-10-CM

## 2018-11-20 DIAGNOSIS — M6281 Muscle weakness (generalized): Secondary | ICD-10-CM | POA: Diagnosis not present

## 2018-11-20 NOTE — Therapy (Signed)
[place] MAIN [facility] SERVICES [address] [place], [place], [address] Phone: [phone]   Fax:  [phone]  Physical Therapy Treatment  Patient Details  Name: Amanda Tucker MRN: [id number] Date of Birth: July 22, 1945 Referring Provider (PT): [name]   Encounter Date: 11/20/2018  PT End of Session - 11/20/18 1438    Visit Number  26    Number of Visits  33    Date for PT Re-Evaluation  11/27/18    PT Start Time  1430    PT Stop Time  1510    PT Time Calculation (min)  40 min    Equipment Utilized During Treatment  Gait belt    Activity Tolerance  Patient tolerated treatment well;Patient limited by fatigue    Behavior During Therapy  WFL for tasks assessed/performed       Past Medical History:  Diagnosis Date  . Anxiety   . Arthritis   . Balance problem   . Depression   . Diabetes mellitus without complication ([place])   . Diverticulitis   . Environmental and seasonal allergies   . GERD (gastroesophageal reflux disease)   . HOH (hard of hearing)   . Hyperlipidemia   . Hypertension   . IBS (irritable bowel syndrome)   . Osteoporosis   . Right sided sciatica   . Vertigo     Past Surgical History:  Procedure Laterality Date  . CATARACT EXTRACTION W/PHACO Right 09/07/2018   Procedure: CATARACT EXTRACTION PHACO AND INTRAOCULAR LENS PLACEMENT;  Surgeon: Marchia Meiers, MD;  Location: [facility] ORS;  Service: Ophthalmology;  Laterality: Right;  [place]: 01:08.2 CDE: 11.02 Fluid Pack Lot # [id number]  . COLONOSCOPY WITH PROPOFOL N/A 02/27/2018   Procedure: COLONOSCOPY WITH PROPOFOL;  Surgeon: Jonathon Bellows, MD;  Location: [facility] ENDOSCOPY;  Service: Gastroenterology;  Laterality: N/A;  . NO PAST SURGERIES      There were no vitals filed for this visit.  Subjective Assessment - 11/20/18 1435    Subjective  Patient is doing well today, reports minimal pain in her right knee.    Pertinent History  Patient was doing better after her last  therapy visitis and now is feeling unsteady and weak again.she doesnt use a assistive device.    Limitations  Standing;Walking    Patient Stated Goals  to walk better    Currently in Pain?  No/denies    Pain Score  0-No pain    Pain Onset  Today       Ther-ex  Nu-step  x 5 mins  Hip flexion marches with 2# ankle weights x 10 bilateral; Hip abduction with 2# x 10 bilateral; HS curls with 2# AW x 10 bilateral; Hip extension with 2# x 10 bilateral; Quantum leg press 35# x 20 x 3 sets, 45 heel raises x 20   Standing 3-way hip: flex, abd, ext x 15 x 2 sets Squats x 15 with cues for correct posture Lunges to BOSU ball x 15 BLE Heel raises x 15 x 2 sets High marching x 20 BLE  Step ups to 6-inch stool x 20  Eccentric step downs from 3 inch stool x 10 verbal cues to complete slow and tap heel.  BUE CGA Patient needs occasional verbal cueing to improve posture and cueing to correctly perform exercises slowly, holding at end of range to increase motor firing of desired muscle to encourage fatigue.    Cues for weight shift and to avoid falling to LLE for better foot clearance; Patient able  to exhibit better weight shift with increased repetition with less heavy step with LLE                      PT Education - 11/20/18 1435    Education Details  HEP    Person(s) Educated  Patient    Methods  Explanation    Comprehension  Verbalized understanding;Returned demonstration;Need further instruction       PT Short Term Goals - 08/08/18 1503      PT SHORT TERM GOAL #1   Title  Patient will be independent in home exercise program to improve strength/mobility for better functional independence with ADLs.    Time  4    Period  Weeks    Status  Achieved    Target Date  07/03/18      PT SHORT TERM GOAL #2   Title  Patient will increase Berg Balance score by > 6 points to demonstrate decreased fall risk during functional activities.    Time  4    Period  Weeks    Status   Partially Met    Target Date  07/03/18        PT Long Term Goals - 10/25/18 1400      PT LONG TERM GOAL #1   Title   Patient will be independent with ascend/descend 12 steps using single UE in step over step pattern without LOB.    Baseline  needs BUE railings for 12 steps    Time  8    Period  Weeks    Status  Partially Met    Target Date  11/27/18      PT LONG TERM GOAL #2   Title  Patient will increase ABC scale score >80% to demonstrate better functional mobility and better confidence with ADLs.     Baseline  10/25/18=75%    Time  8    Period  Weeks    Status  Partially Met    Target Date  11/27/18      PT LONG TERM GOAL #3   Title  Patient will increase dynamic gait index score to >19/24 as to demonstrate reduced fall risk and improved dynamic gait balance for better safety with community/home ambulation.     Baseline  10/25/18=20/24    Time  8    Period  Weeks    Status  Partially Met    Target Date  11/27/18            Plan - 11/20/18 1439    Clinical Impression Statement  Patient instructed in advanced LE strengthening and intermediate balance exercise. Patient required min VCS to improve weight shift and to increase terminal knee extension for better stance control. Patient would benefit from additional skilled PT intervention to improve strength, balance and gait safety.   Rehab Potential  Good    PT Frequency  2x / week    PT Duration  8 weeks    PT Treatment/Interventions  Gait training;Therapeutic exercise;Patient/family education;Neuromuscular re-education;Balance training    PT Next Visit Plan  balance and therapeutic exericse    PT Home Exercise Plan  hip abd standing,    Consulted and Agree with Plan of Care  Patient       Patient will benefit from skilled therapeutic intervention in order to improve the following deficits and impairments:  Abnormal gait, Decreased balance, Decreased endurance, Decreased mobility, Difficulty walking, Pain, Decreased  strength, Decreased activity tolerance  Visit Diagnosis: Other abnormalities of  gait and mobility  Muscle weakness (generalized)  Difficulty in walking, not elsewhere classified     Problem List Patient Active Problem List   Diagnosis Date Noted  . IBS (irritable bowel syndrome) 03/09/2018  . Hypokalemia 03/09/2018  . Age-related osteoporosis without current pathological fracture 03/09/2018  . Baker's cyst of knee, right 06/06/2017  . Insomnia 03/07/2017  . Tobacco abuse 12/01/2016  . Hypertension   . Hyperlipidemia   . MDD (major depressive disorder)   . Generalized anxiety disorder     Alanson Puls, [place] DPT 11/20/2018, 3:03 PM  [place] MAIN [facility] SERVICES [address] [place], [place], [address] Phone: [phone]   Fax:  [phone]  Name: Amanda Tucker MRN: [id number] Date of Birth: Jan 15, 1946

## 2018-11-22 ENCOUNTER — Ambulatory Visit: Payer: Medicare Other | Admitting: Physical Therapy

## 2018-11-24 ENCOUNTER — Other Ambulatory Visit: Payer: Self-pay | Admitting: Family Medicine

## 2018-11-24 MED ORDER — SERTRALINE HCL 100 MG PO TABS: 100.0000 mg | ORAL_TABLET | Freq: Every day | ORAL | 3 refills | Status: DC

## 2018-11-24 NOTE — Telephone Encounter (Signed)
Refill on  sertraline 100 mg  [facility] [address] [employer]

## 2018-11-27 ENCOUNTER — Ambulatory Visit: Payer: Medicare Other | Admitting: Physical Therapy

## 2018-11-28 ENCOUNTER — Other Ambulatory Visit: Payer: Self-pay

## 2018-11-28 ENCOUNTER — Ambulatory Visit: Payer: Medicare Other | Attending: Family Medicine | Admitting: Physical Therapy

## 2018-11-28 ENCOUNTER — Encounter: Payer: Self-pay | Admitting: Physical Therapy

## 2018-11-28 DIAGNOSIS — R262 Difficulty in walking, not elsewhere classified: Secondary | ICD-10-CM | POA: Insufficient documentation

## 2018-11-28 DIAGNOSIS — R2689 Other abnormalities of gait and mobility: Secondary | ICD-10-CM | POA: Diagnosis not present

## 2018-11-28 DIAGNOSIS — M6281 Muscle weakness (generalized): Secondary | ICD-10-CM | POA: Diagnosis not present

## 2018-11-28 NOTE — Therapy (Signed)
[place] MAIN [facility] SERVICES [address] [place], [place], [address] Phone: [phone]   Fax:  [phone]  Physical Therapy Treatment  Patient Details  Name: Amanda Tucker MRN: [id number] Date of Birth: 1945-02-02 Referring Provider (PT): [name]   Encounter Date: 11/28/2018  PT End of Session - 11/28/18 1527    Visit Number  27    Number of Visits  33    Date for PT Re-Evaluation  11/27/18    PT Start Time  7622    PT Stop Time  1600    PT Time Calculation (min)  45 min    Equipment Utilized During Treatment  Gait belt    Activity Tolerance  Patient tolerated treatment well;Patient limited by fatigue    Behavior During Therapy  [facility] for tasks assessed/performed       Past Medical History:  Diagnosis Date  . Anxiety   . Arthritis   . Balance problem   . Depression   . Diabetes mellitus without complication ([place])   . Diverticulitis   . Environmental and seasonal allergies   . GERD (gastroesophageal reflux disease)   . HOH (hard of hearing)   . Hyperlipidemia   . Hypertension   . IBS (irritable bowel syndrome)   . Osteoporosis   . Right sided sciatica   . Vertigo     Past Surgical History:  Procedure Laterality Date  . CATARACT EXTRACTION W/PHACO Right 09/07/2018   Procedure: CATARACT EXTRACTION PHACO AND INTRAOCULAR LENS PLACEMENT;  Surgeon: Marchia Meiers, MD;  Location: [facility] ORS;  Service: Ophthalmology;  Laterality: Right;  [place]: 01:08.2 CDE: 11.02 Fluid Pack Lot # [id number]  . COLONOSCOPY WITH PROPOFOL N/A 02/27/2018   Procedure: COLONOSCOPY WITH PROPOFOL;  Surgeon: Jonathon Bellows, MD;  Location: [facility] ENDOSCOPY;  Service: Gastroenterology;  Laterality: N/A;  . NO PAST SURGERIES      There were no vitals filed for this visit.  Subjective Assessment - 11/28/18 1521    Subjective  Patient is doing well today, reports minimal pain in her right knee.    Pertinent History  Patient was doing better after her last therapy  visitis and now is feeling unsteady and weak again.she doesnt use a assistive device.    Limitations  Standing;Walking    Patient Stated Goals  to walk better    Currently in Pain?  Yes    Pain Score  1     Pain Location  Knee    Pain Orientation  Right    Pain Descriptors / Indicators  Aching    Pain Type  Chronic pain    Pain Onset  Today    Aggravating Factors   standing    Pain Relieving Factors  rest    Effect of Pain on Daily Activities  difficult to do tasks    Multiple Pain Sites  No         Neuromuscular Re-education  Rocker board fwd/bwd, side to side x 20 each direction Tandem gait on balance beam without UE support x 2 lengths Side stepping on balance beam without UE support x 2 lengths Heel/toe raises without UE support 3s hold x 10 each 1/2 foam roll balance with flat side up 30s x 2 reps 1/2 foam roll balance with flat side down 30s x 2 reps Lateral side steps from foam to 6 inch stool left and right x 15 Backwards stepping from foam to 6 inch stool x 15  Four [place] fwd/bwd, side to side ,  diagonal x 10 ,cues for posture and stepping strategies, occasional LOB       Pt educated throughout session about proper posture and technique with exercises. Improved exercise technique, movement at target joints, use of target muscles after min to mod verbal, visual, tactile cues. CGA and Min to mod verbal cues used throughout with increased in postural sway and LOB most seen with narrow base of support and while on uneven surfaces. Continues to have balance deficits typical with diagnosis. Patient performs intermediate level exercises without pain behaviors and needs verbal cuing for postural alignment and head positioning Tactile cues and assistance needed to keep lower leg and knee in neutral to avoid compensations with ankle motions.                       PT Education - 11/28/18 1527    Education Details  HEP    Person(s) Educated  Patient    Methods   Explanation    Comprehension  Verbalized understanding;Returned demonstration;Tactile cues required;Need further instruction       PT Short Term Goals - 08/08/18 1503      PT SHORT TERM GOAL #1   Title  Patient will be independent in home exercise program to improve strength/mobility for better functional independence with ADLs.    Time  4    Period  Weeks    Status  Achieved    Target Date  07/03/18      PT SHORT TERM GOAL #2   Title  Patient will increase Berg Balance score by > 6 points to demonstrate decreased fall risk during functional activities.    Time  4    Period  Weeks    Status  Partially Met    Target Date  07/03/18        PT Long Term Goals - 11/28/18 1542      PT LONG TERM GOAL #1   Title   Patient will be independent with ascend/descend 12 steps using single UE in step over step pattern without LOB.    Baseline  needs BUE railings for 12 steps    Time  8    Period  Weeks    Status  Partially Met    Target Date  01/23/19      PT LONG TERM GOAL #2   Title  Patient will increase ABC scale score >80% to demonstrate better functional mobility and better confidence with ADLs.     Baseline  10/25/18=75%    Time  8    Period  Weeks    Status  Partially Met    Target Date  01/23/19      PT LONG TERM GOAL #3   Title  Patient will increase dynamic gait index score to >19/24 as to demonstrate reduced fall risk and improved dynamic gait balance for better safety with community/home ambulation.     Baseline  10/25/18=20/24    Time  8    Period  Weeks    Status  Partially Met    Target Date  01/23/19            Plan - 11/28/18 1528    Clinical Impression Statement  Patient instructed in advanced LE strengthening and intermediate balance exercise. Patient required min VCS to improve weight shift and to increased step height and length for gait and upright posture. Patient would benefit from additional skilled PT intervention to improve strength, balance and  gait safety.    Rehab  Potential  Good    PT Frequency  2x / week    PT Duration  8 weeks    PT Treatment/Interventions  Gait training;Therapeutic exercise;Patient/family education;Neuromuscular re-education;Balance training    PT Next Visit Plan  balance and therapeutic exericse    PT Home Exercise Plan  hip abd standing,    Consulted and Agree with Plan of Care  Patient       Patient will benefit from skilled therapeutic intervention in order to improve the following deficits and impairments:  Abnormal gait, Decreased balance, Decreased endurance, Decreased mobility, Difficulty walking, Pain, Decreased strength, Decreased activity tolerance  Visit Diagnosis: Other abnormalities of gait and mobility  Muscle weakness (generalized)  Difficulty in walking, not elsewhere classified     Problem List Patient Active Problem List   Diagnosis Date Noted  . IBS (irritable bowel syndrome) 03/09/2018  . Hypokalemia 03/09/2018  . Age-related osteoporosis without current pathological fracture 03/09/2018  . Baker's cyst of knee, right 06/06/2017  . Insomnia 03/07/2017  . Tobacco abuse 12/01/2016  . Hypertension   . Hyperlipidemia   . MDD (major depressive disorder)   . Generalized anxiety disorder     Alanson Puls, [place] DPT 11/28/2018, 3:43 PM  [place] MAIN [facility] SERVICES [address] [place], [place], [address] Phone: [phone]   Fax:  [phone]  Name: Keysi Oelkers MRN: [id number] Date of Birth: 1945-03-30

## 2018-11-29 ENCOUNTER — Ambulatory Visit: Payer: Medicare Other | Admitting: Physical Therapy

## 2018-11-30 ENCOUNTER — Ambulatory Visit: Payer: Medicare Other | Admitting: Physical Therapy

## 2018-12-05 ENCOUNTER — Ambulatory Visit: Payer: Medicare Other | Admitting: Physical Therapy

## 2018-12-05 ENCOUNTER — Encounter: Payer: Self-pay | Admitting: Physical Therapy

## 2018-12-05 ENCOUNTER — Other Ambulatory Visit: Payer: Self-pay

## 2018-12-05 DIAGNOSIS — R2689 Other abnormalities of gait and mobility: Secondary | ICD-10-CM

## 2018-12-05 DIAGNOSIS — R262 Difficulty in walking, not elsewhere classified: Secondary | ICD-10-CM

## 2018-12-05 DIAGNOSIS — M6281 Muscle weakness (generalized): Secondary | ICD-10-CM

## 2018-12-05 NOTE — Therapy (Signed)
[place] MAIN [facility] SERVICES [address] [place], [place], [address] Phone: [phone]   Fax:  [phone]  Physical Therapy Treatment  Patient Details  Name: Amanda Tucker MRN: [id number] Date of Birth: 1945/08/07 Referring Provider (PT): [name]   Encounter Date: 12/05/2018  PT End of Session - 12/05/18 1438    Visit Number  28    Number of Visits  33    Date for PT Re-Evaluation  11/27/18    PT Start Time  1430    PT Stop Time  1510    PT Time Calculation (min)  40 min    Equipment Utilized During Treatment  Gait belt    Activity Tolerance  Patient tolerated treatment well;Patient limited by fatigue    Behavior During Therapy  WFL for tasks assessed/performed       Past Medical History:  Diagnosis Date  . Anxiety   . Arthritis   . Balance problem   . Depression   . Diabetes mellitus without complication ([place])   . Diverticulitis   . Environmental and seasonal allergies   . GERD (gastroesophageal reflux disease)   . HOH (hard of hearing)   . Hyperlipidemia   . Hypertension   . IBS (irritable bowel syndrome)   . Osteoporosis   . Right sided sciatica   . Vertigo     Past Surgical History:  Procedure Laterality Date  . CATARACT EXTRACTION W/PHACO Right 09/07/2018   Procedure: CATARACT EXTRACTION PHACO AND INTRAOCULAR LENS PLACEMENT;  Surgeon: Marchia Meiers, MD;  Location: [facility] ORS;  Service: Ophthalmology;  Laterality: Right;  [place]: 01:08.2 CDE: 11.02 Fluid Pack Lot # [id number]  . COLONOSCOPY WITH PROPOFOL N/A 02/27/2018   Procedure: COLONOSCOPY WITH PROPOFOL;  Surgeon: Jonathon Bellows, MD;  Location: [facility] ENDOSCOPY;  Service: Gastroenterology;  Laterality: N/A;  . NO PAST SURGERIES      There were no vitals filed for this visit.  Subjective Assessment - 12/05/18 1437    Subjective  Patient is doing well today, reports minimal pain in her right knee.    Pertinent History  Patient was doing better after her last  therapy visitis and now is feeling unsteady and weak again.she doesnt use a assistive device.    Limitations  Standing;Walking    Patient Stated Goals  to walk better    Currently in Pain?  Yes    Pain Score  3     Pain Location  Knee    Pain Orientation  Right    Pain Descriptors / Indicators  Aching    Pain Type  Chronic pain    Pain Onset  Today    Pain Frequency  Intermittent    Aggravating Factors   standing    Pain Relieving Factors  rest    Effect of Pain on Daily Activities  difficulty with tasks    Multiple Pain Sites  No       Treatment: Neuromuscular training: side stepping left and right in parallel bars 10 feet x 3 standing on blue foam with head turns x 1 min  Standing feet together on blue foam with head turns x 1 min step ups from floor to 6 inch stool x 20 bilateral Step ups from blue foam to 6 inch stool x 20 BLE sit to stand x 10 marching in parallel bars x 20 stepping pattern with weight shifting fwd/bwd x 10.  Pt educated throughout session about proper posture and technique with exercises. Improved exercise technique, movement at  target joints, use of target muscles after min to mod verbal, visual, tactile cues. CGA and Min to mod verbal cues used throughout with increased in postural sway and LOB most seen with narrow base of support and while on uneven surfaces. Continues to have balance deficits typical with diagnosis. Patient performs intermediate level exercises without pain behaviors and needs verbal cuing for postural alignment                       PT Education - 12/05/18 1438    Education Details  HEP    Person(s) Educated  Patient    Methods  Explanation;Demonstration    Comprehension  Verbalized understanding;Returned demonstration;Need further instruction       PT Short Term Goals - 08/08/18 1503      PT SHORT TERM GOAL #1   Title  Patient will be independent in home exercise program to improve strength/mobility for  better functional independence with ADLs.    Time  4    Period  Weeks    Status  Achieved    Target Date  07/03/18      PT SHORT TERM GOAL #2   Title  Patient will increase Berg Balance score by > 6 points to demonstrate decreased fall risk during functional activities.    Time  4    Period  Weeks    Status  Partially Met    Target Date  07/03/18        PT Long Term Goals - 11/28/18 1542      PT LONG TERM GOAL #1   Title   Patient will be independent with ascend/descend 12 steps using single UE in step over step pattern without LOB.    Baseline  needs BUE railings for 12 steps    Time  8    Period  Weeks    Status  Partially Met    Target Date  01/23/19      PT LONG TERM GOAL #2   Title  Patient will increase ABC scale score >80% to demonstrate better functional mobility and better confidence with ADLs.     Baseline  10/25/18=75%    Time  8    Period  Weeks    Status  Partially Met    Target Date  01/23/19      PT LONG TERM GOAL #3   Title  Patient will increase dynamic gait index score to >19/24 as to demonstrate reduced fall risk and improved dynamic gait balance for better safety with community/home ambulation.     Baseline  10/25/18=20/24    Time  8    Period  Weeks    Status  Partially Met    Target Date  01/23/19            Plan - 12/05/18 1439    Clinical Impression Statement  Patient instructed in beginning balance and coordination exercise. Patient required mod VCs and min A for gait to improve weight shift and postural control. Patient requires min VCs to improve ankle stability with R AFO with gait.  Patients would benefit from additional skilled PT intervention to improve balance/gait safety and reduce fall risk.   Rehab Potential  Good    PT Frequency  2x / week    PT Duration  8 weeks    PT Treatment/Interventions  Gait training;Therapeutic exercise;Patient/family education;Neuromuscular re-education;Balance training    PT Next Visit Plan  balance  and therapeutic exericse    PT Home  Exercise Plan  hip abd standing,    Consulted and Agree with Plan of Care  Patient       Patient will benefit from skilled therapeutic intervention in order to improve the following deficits and impairments:  Abnormal gait, Decreased balance, Decreased endurance, Decreased mobility, Difficulty walking, Pain, Decreased strength, Decreased activity tolerance  Visit Diagnosis: Other abnormalities of gait and mobility  Muscle weakness (generalized)  Difficulty in walking, not elsewhere classified     Problem List Patient Active Problem List   Diagnosis Date Noted  . IBS (irritable bowel syndrome) 03/09/2018  . Hypokalemia 03/09/2018  . Age-related osteoporosis without current pathological fracture 03/09/2018  . Baker's cyst of knee, right 06/06/2017  . Insomnia 03/07/2017  . Tobacco abuse 12/01/2016  . Hypertension   . Hyperlipidemia   . MDD (major depressive disorder)   . Generalized anxiety disorder     Alanson Puls, [place] DPT 12/05/2018, 2:40 PM  [place] MAIN [facility] SERVICES [address] [place], [place], [address] Phone: [phone]   Fax:  [phone]  Name: Amanda Tucker MRN: [id number] Date of Birth: February 17, 1945

## 2018-12-07 ENCOUNTER — Ambulatory Visit: Payer: Medicare Other | Admitting: Physical Therapy

## 2018-12-07 ENCOUNTER — Other Ambulatory Visit: Payer: Self-pay

## 2018-12-07 ENCOUNTER — Encounter: Payer: Self-pay | Admitting: Physical Therapy

## 2018-12-07 DIAGNOSIS — R2689 Other abnormalities of gait and mobility: Secondary | ICD-10-CM

## 2018-12-07 DIAGNOSIS — R262 Difficulty in walking, not elsewhere classified: Secondary | ICD-10-CM | POA: Diagnosis not present

## 2018-12-07 DIAGNOSIS — M6281 Muscle weakness (generalized): Secondary | ICD-10-CM | POA: Diagnosis not present

## 2018-12-07 NOTE — Therapy (Signed)
[place] MAIN [facility] SERVICES [address] [place], [place], [address] Phone: [phone]   Fax:  [phone]  Physical Therapy Treatment  Patient Details  Name: Amanda Tucker MRN: [id number] Date of Birth: 1945-10-13 Referring Provider (PT): [name]   Encounter Date: 12/07/2018  PT End of Session - 12/07/18 1439    Visit Number  29    Number of Visits  33    Date for PT Re-Evaluation  01/22/19    PT Start Time  1430    PT Stop Time  1510    PT Time Calculation (min)  40 min    Equipment Utilized During Treatment  Gait belt    Activity Tolerance  Patient tolerated treatment well;Patient limited by fatigue    Behavior During Therapy  [facility] for tasks assessed/performed       Past Medical History:  Diagnosis Date  . Anxiety   . Arthritis   . Balance problem   . Depression   . Diabetes mellitus without complication ([place])   . Diverticulitis   . Environmental and seasonal allergies   . GERD (gastroesophageal reflux disease)   . HOH (hard of hearing)   . Hyperlipidemia   . Hypertension   . IBS (irritable bowel syndrome)   . Osteoporosis   . Right sided sciatica   . Vertigo     Past Surgical History:  Procedure Laterality Date  . CATARACT EXTRACTION W/PHACO Right 09/07/2018   Procedure: CATARACT EXTRACTION PHACO AND INTRAOCULAR LENS PLACEMENT;  Surgeon: Marchia Meiers, MD;  Location: [facility] ORS;  Service: Ophthalmology;  Laterality: Right;  [place]: 01:08.2 CDE: 11.02 Fluid Pack Lot # [id number]  . COLONOSCOPY WITH PROPOFOL N/A 02/27/2018   Procedure: COLONOSCOPY WITH PROPOFOL;  Surgeon: Jonathon Bellows, MD;  Location: [facility] ENDOSCOPY;  Service: Gastroenterology;  Laterality: N/A;  . NO PAST SURGERIES      There were no vitals filed for this visit.  Subjective Assessment - 12/07/18 1435    Subjective  Patient is doing well today, reports minimal pain in her right knee.    Pertinent History  Patient was doing better after her last  therapy visitis and now is feeling unsteady and weak again.she doesnt use a assistive device.    Limitations  Standing;Walking    Patient Stated Goals  to walk better    Currently in Pain?  Yes    Pain Score  2     Pain Location  Knee    Pain Orientation  Right    Pain Descriptors / Indicators  Aching    Pain Type  Chronic pain    Pain Onset  Today    Pain Frequency  Intermittent    Aggravating Factors   standing    Pain Relieving Factors  rest    Effect of Pain on Daily Activities  difficulty walking       Therapeutic exercise: Octane fitness x 5 mins L 4  Supine: 2 lbs BLE SLR x 15 BLE [place] marching x 15  [place] abd/ER x 15  Bridging x 15 SAQ x 15 BLE Hip abd/add x 15, BLE Heel slides x 15, BLE  Sidelying: Hip abd x 15 , BLE Flex/ext x 15, BLE Calm x 15 , BLE  Prone: Knee flex x 15 BLE Knee flex and hip ext x 15 , BLE Knee ext and hip ext x 15 BLE  Standing hip abd 2 lbs , 15 reps BLE Standing hip ext  With 2 lbs x 15 BLe  Heel raises x 20 x 2 Squats  X 10 x 2       Patient performed with instruction, verbal cues, tactile cues of therapist: goal: increase tissue extensibility, promote proper posture, improve mobility                    PT Education - 12/07/18 1439    Education Details  HEP    Person(s) Educated  Patient    Methods  Explanation;Demonstration    Comprehension  Verbalized understanding       PT Short Term Goals - 08/08/18 1503      PT SHORT TERM GOAL #1   Title  Patient will be independent in home exercise program to improve strength/mobility for better functional independence with ADLs.    Time  4    Period  Weeks    Status  Achieved    Target Date  07/03/18      PT SHORT TERM GOAL #2   Title  Patient will increase Berg Balance score by > 6 points to demonstrate decreased fall risk during functional activities.    Time  4    Period  Weeks    Status  Partially Met    Target Date  07/03/18        PT Long  Term Goals - 11/28/18 1542      PT LONG TERM GOAL #1   Title   Patient will be independent with ascend/descend 12 steps using single UE in step over step pattern without LOB.    Baseline  needs BUE railings for 12 steps    Time  8    Period  Weeks    Status  Partially Met    Target Date  01/23/19      PT LONG TERM GOAL #2   Title  Patient will increase ABC scale score >80% to demonstrate better functional mobility and better confidence with ADLs.     Baseline  10/25/18=75%    Time  8    Period  Weeks    Status  Partially Met    Target Date  01/23/19      PT LONG TERM GOAL #3   Title  Patient will increase dynamic gait index score to >19/24 as to demonstrate reduced fall risk and improved dynamic gait balance for better safety with community/home ambulation.     Baseline  10/25/18=20/24    Time  8    Period  Weeks    Status  Partially Met    Target Date  01/23/19            Plan - 12/07/18 1441    Clinical Impression Statement  Patient instructed in advanced LE strengthening and intermediate balance exercise. Patient required min VCS to improve weight shift and to increase terminal knee extension for better stance control. Patient would benefit from additional skilled PT intervention to improve strength, balance and gait safety.   Rehab Potential  Good    PT Frequency  2x / week    PT Duration  8 weeks    PT Treatment/Interventions  Gait training;Therapeutic exercise;Patient/family education;Neuromuscular re-education;Balance training    PT Next Visit Plan  balance and therapeutic exericse    PT Home Exercise Plan  hip abd standing,    Consulted and Agree with Plan of Care  Patient       Patient will benefit from skilled therapeutic intervention in order to improve the following deficits and impairments:  Abnormal gait, Decreased balance, Decreased  endurance, Decreased mobility, Difficulty walking, Pain, Decreased strength, Decreased activity tolerance  Visit  Diagnosis: Other abnormalities of gait and mobility  Muscle weakness (generalized)  Difficulty in walking, not elsewhere classified     Problem List Patient Active Problem List   Diagnosis Date Noted  . IBS (irritable bowel syndrome) 03/09/2018  . Hypokalemia 03/09/2018  . Age-related osteoporosis without current pathological fracture 03/09/2018  . Baker's cyst of knee, right 06/06/2017  . Insomnia 03/07/2017  . Tobacco abuse 12/01/2016  . Hypertension   . Hyperlipidemia   . MDD (major depressive disorder)   . Generalized anxiety disorder     Alanson Puls, [place] DPT 12/07/2018, 2:42 PM  [place] MAIN [facility] SERVICES [address] [place], [place], [address] Phone: [phone]   Fax:  [phone]  Name: Shannin Naab MRN: [id number] Date of Birth: 1945/07/01

## 2018-12-11 ENCOUNTER — Ambulatory Visit: Payer: Medicare Other

## 2018-12-11 ENCOUNTER — Ambulatory Visit: Payer: Medicare Other | Admitting: Family Medicine

## 2018-12-13 ENCOUNTER — Encounter: Payer: Self-pay | Admitting: *Deleted

## 2018-12-14 ENCOUNTER — Ambulatory Visit: Payer: Medicare Other | Admitting: Physical Therapy

## 2018-12-14 ENCOUNTER — Encounter: Payer: Self-pay | Admitting: Physical Therapy

## 2018-12-14 ENCOUNTER — Other Ambulatory Visit: Payer: Self-pay

## 2018-12-14 DIAGNOSIS — M6281 Muscle weakness (generalized): Secondary | ICD-10-CM

## 2018-12-14 DIAGNOSIS — R2689 Other abnormalities of gait and mobility: Secondary | ICD-10-CM

## 2018-12-14 DIAGNOSIS — R262 Difficulty in walking, not elsewhere classified: Secondary | ICD-10-CM

## 2018-12-14 NOTE — Therapy (Signed)
[place] MAIN [facility] SERVICES [address] [place], [place], [address] Phone: [phone]   Fax:  [phone]  Physical Therapy Treatment Physical Therapy Progress Note   Dates of reporting period 10/26/18  to  12/14/18  Patient Details  Name: Amanda Tucker MRN: [id number] Date of Birth: 1945-07-17 Referring Provider (PT): [name]   Encounter Date: 12/14/2018  PT End of Session - 12/14/18 1441    Visit Number  30    Number of Visits  33    Date for PT Re-Evaluation  01/22/19    PT Start Time  0237    PT Stop Time  0315    PT Time Calculation (min)  38 min    Equipment Utilized During Treatment  Gait belt    Activity Tolerance  Patient tolerated treatment well;Patient limited by fatigue    Behavior During Therapy  [facility] for tasks assessed/performed       Past Medical History:  Diagnosis Date  . Anxiety   . Arthritis   . Balance problem   . Depression   . Diabetes mellitus without complication ([place])   . Diverticulitis   . Environmental and seasonal allergies   . GERD (gastroesophageal reflux disease)   . HOH (hard of hearing)   . Hyperlipidemia   . Hypertension   . IBS (irritable bowel syndrome)   . Osteoporosis   . Right sided sciatica   . Vertigo     Past Surgical History:  Procedure Laterality Date  . CATARACT EXTRACTION W/PHACO Right 09/07/2018   Procedure: CATARACT EXTRACTION PHACO AND INTRAOCULAR LENS PLACEMENT;  Surgeon: Marchia Meiers, MD;  Location: [facility] ORS;  Service: Ophthalmology;  Laterality: Right;  [place]: 01:08.2 CDE: 11.02 Fluid Pack Lot # [id number]  . COLONOSCOPY WITH PROPOFOL N/A 02/27/2018   Procedure: COLONOSCOPY WITH PROPOFOL;  Surgeon: Jonathon Bellows, MD;  Location: [facility] ENDOSCOPY;  Service: Gastroenterology;  Laterality: N/A;    There were no vitals filed for this visit.  Subjective Assessment - 12/14/18 1440    Subjective  Patient is doing well today,    Pertinent History  Patient was doing better  after her last therapy visitis and now is feeling unsteady and weak again.she doesnt use a assistive device.    Limitations  Standing;Walking    Patient Stated Goals  to walk better    Currently in Pain?  No/denies    Pain Score  0-No pain    Pain Onset  Today          Treatment: Therapeutic activities: DGI , ABC scale and goals addressed side stepping left and right in parallel bars 10 feet x 3 standing on blue foam with head turns x 1 min  Standing feet together on blue foam with head turns x 1 min step ups from floor to 6 inch stool x 20 bilateral Step ups from blue foam to 6 inch stool x 20 BLE [place] fwd/bwd, side to side , diagonal x 10 ,cues for posture and stepping strategies, occasional LOB [place] stepping left and right x 10       Pt educated throughout session about proper posture and technique with exercises. Improved exercise technique, movement at target joints, use of target muscles after min to mod verbal, visual, tactile cues. CGA and Min to mod verbal cues used throughout with increased in postural sway and LOB most seen with narrow base of support and while on uneven surfaces.  PT Education - 12/14/18 1440    Education Details  HEP    Person(s) Educated  Patient    Methods  Explanation    Comprehension  Verbalized understanding;Returned demonstration;Tactile cues required;Need further instruction       PT Short Term Goals - 08/08/18 1503      PT SHORT TERM GOAL #1   Title  Patient will be independent in home exercise program to improve strength/mobility for better functional independence with ADLs.    Time  4    Period  Weeks    Status  Achieved    Target Date  07/03/18      PT SHORT TERM GOAL #2   Title  Patient will increase Berg Balance score by > 6 points to demonstrate decreased fall risk during functional activities.    Time  4    Period  Weeks    Status  Partially Met    Target Date  07/03/18         PT Long Term Goals - 12/14/18 1442      PT LONG TERM GOAL #1   Title   Patient will be independent with ascend/descend 12 steps using single UE in step over step pattern without LOB.    Baseline  needs BUE railings for 12 steps,12/14/18= able to use single UE    Time  8    Period  Weeks    Status  Partially Met    Target Date  01/23/19      PT LONG TERM GOAL #2   Title  Patient will increase ABC scale score >80% to demonstrate better functional mobility and better confidence with ADLs.     Baseline  10/25/18=75%, 12/14/18=91%    Time  8    Period  Weeks    Status  Achieved    Target Date  12/14/18      PT LONG TERM GOAL #3   Title  Patient will increase dynamic gait index score to >19/24 as to demonstrate reduced fall risk and improved dynamic gait balance for better safety with community/home ambulation.     Baseline  10/25/18=20/24, 12/14/18=20/24    Time  8    Period  Weeks    Status  On-going    Target Date  01/23/19            Plan - 12/14/18 1441    Clinical Impression Statement  Patient's condition has the potential to improve in response to therapy. Maximum improvement is yet to be obtained. The anticipated improvement is attainable and reasonable in a generally predictable time.  Patient reports that she is feeling more steady. Patient instructed in intermediate mobility challenges, transfer training and safety training. Patient required min-mod VCs for correct positioning; Patient had increased difficulty with dynamic balance challenges and dule task movements especially in standing.  Patient would benefit from additional skilled PT intervention to improve strength, balance.   Rehab Potential  Good    PT Frequency  2x / week    PT Duration  8 weeks    PT Treatment/Interventions  Gait training;Therapeutic exercise;Patient/family education;Neuromuscular re-education;Balance training    PT Next Visit Plan  balance and therapeutic exericse    PT Home Exercise Plan   hip abd standing,    Consulted and Agree with Plan of Care  Patient       Patient will benefit from skilled therapeutic intervention in order to improve the following deficits and impairments:  Abnormal gait, Decreased balance, Decreased endurance, Decreased mobility,  Difficulty walking, Pain, Decreased strength, Decreased activity tolerance  Visit Diagnosis: Other abnormalities of gait and mobility  Muscle weakness (generalized)  Difficulty in walking, not elsewhere classified     Problem List Patient Active Problem List   Diagnosis Date Noted  . IBS (irritable bowel syndrome) 03/09/2018  . Hypokalemia 03/09/2018  . Age-related osteoporosis without current pathological fracture 03/09/2018  . Baker's cyst of knee, right 06/06/2017  . Insomnia 03/07/2017  . Tobacco abuse 12/01/2016  . Hypertension   . Hyperlipidemia   . MDD (major depressive disorder)   . Generalized anxiety disorder     Alanson Puls, [place] DPT 12/14/2018, 3:30 PM  [place] MAIN [facility] SERVICES [address] [place], [place], [address] Phone: [phone]   Fax:  [phone]  Name: Ardene Remley MRN: [id number] Date of Birth: November 24, 1945

## 2018-12-19 ENCOUNTER — Encounter: Payer: Self-pay | Admitting: Physical Therapy

## 2018-12-19 ENCOUNTER — Ambulatory Visit: Payer: Medicare Other | Admitting: Physical Therapy

## 2018-12-19 ENCOUNTER — Other Ambulatory Visit: Payer: Self-pay

## 2018-12-19 DIAGNOSIS — M6281 Muscle weakness (generalized): Secondary | ICD-10-CM | POA: Diagnosis not present

## 2018-12-19 DIAGNOSIS — R2689 Other abnormalities of gait and mobility: Secondary | ICD-10-CM | POA: Diagnosis not present

## 2018-12-19 DIAGNOSIS — R262 Difficulty in walking, not elsewhere classified: Secondary | ICD-10-CM | POA: Diagnosis not present

## 2018-12-19 NOTE — Therapy (Signed)
[place] [facility] MAIN REHAB SERVICES [address] [place], [place], [address] Phone: [phone]   Fax:  [phone]  Physical Therapy Treatment  Patient Details  Name: Amanda Tucker MRN: [id number] Date of Birth: 02/18/1945 Referring Provider (PT): angela Bacigalupo   Encounter Date: 12/19/2018  PT End of Session - 12/19/18 1451    Visit Number  31    PT Start Time  0230    PT Stop Time  0310    PT Time Calculation (min)  40 min       Past Medical History:  Diagnosis Date  . Anxiety   . Arthritis   . Balance problem   . Depression   . Diabetes mellitus without complication (HCC)   . Diverticulitis   . Environmental and seasonal allergies   . GERD (gastroesophageal reflux disease)   . HOH (hard of hearing)   . Hyperlipidemia   . Hypertension   . IBS (irritable bowel syndrome)   . Osteoporosis   . Right sided sciatica   . Vertigo     Past Surgical History:  Procedure Laterality Date  . CATARACT EXTRACTION W/PHACO Right 09/07/2018   Procedure: CATARACT EXTRACTION PHACO AND INTRAOCULAR LENS PLACEMENT;  Surgeon: Harrow, Brian, MD;  Location: [facility] ORS;  Service: Ophthalmology;  Laterality: Right;  US: 01:08.2 CDE: 11.02 Fluid Pack Lot # [id number]  . COLONOSCOPY WITH PROPOFOL N/A 02/27/2018   Procedure: COLONOSCOPY WITH PROPOFOL;  Surgeon: Anna, Kiran, MD;  Location: [facility] ENDOSCOPY;  Service: Gastroenterology;  Laterality: N/A;    There were no vitals filed for this visit.  Subjective Assessment - 12/19/18 1450    Subjective  Patient is doing well today,    Pertinent History  Patient was doing better after her last therapy visitis and now is feeling unsteady and weak again.she doesnt use a assistive device.    Limitations  Standing;Walking    Patient Stated Goals  to walk better    Currently in Pain?  No/denies    Pain Score  0-No pain    Pain Onset  Today       Treatment: Neuromuscular training: side stepping left and right in  parallel bars 10 feet x 3 standing on blue foam with head turns x 1 min  Standing feet together on blue foam with head turns x 1 min step ups from floor to 6 inch stool x 20 bilateral Step ups from blue foam to 6 inch stool x 20 BLE sit to stand x 10 marching in parallel bars x 20 Gait training with head turns in hall way x 400 feet and path deviation observed  Pt educated throughout session about proper posture and technique with exercises. Improved exercise technique, movement at target joints, use of target muscles after min to mod verbal, visual, tactile cues. CGA and Min to mod verbal cues used throughout with increased in postural sway and LOB most seen with narrow base of support and while on uneven surfaces. Continues to have balance deficits typical with diagnosis. Patient performs intermediate level exercises without pain behaviors and needs verbal cuing for postural alignment and head positioning                       PT Education - 12/19/18 1451    Education Details  HEP    Person(s) Educated  Patient    Methods  Explanation    Comprehension  Verbalized understanding;Returned demonstration;Tactile cues required;Need further instruction         PT Short Term Goals - 08/08/18 1503      PT SHORT TERM GOAL #1   Title  Patient will be independent in home exercise program to improve strength/mobility for better functional independence with ADLs.    Time  4    Period  Weeks    Status  Achieved    Target Date  07/03/18      PT SHORT TERM GOAL #2   Title  Patient will increase Berg Balance score by > 6 points to demonstrate decreased fall risk during functional activities.    Time  4    Period  Weeks    Status  Partially Met    Target Date  07/03/18        PT Long Term Goals - 12/14/18 1442      PT LONG TERM GOAL #1   Title   Patient will be independent with ascend/descend 12 steps using single UE in step over step pattern without LOB.    Baseline   needs BUE railings for 12 steps,12/14/18= able to use single UE    Time  8    Period  Weeks    Status  Partially Met    Target Date  01/23/19      PT LONG TERM GOAL #2   Title  Patient will increase ABC scale score >80% to demonstrate better functional mobility and better confidence with ADLs.     Baseline  10/25/18=75%, 12/14/18=91%    Time  8    Period  Weeks    Status  Achieved    Target Date  12/14/18      PT LONG TERM GOAL #3   Title  Patient will increase dynamic gait index score to >19/24 as to demonstrate reduced fall risk and improved dynamic gait balance for better safety with community/home ambulation.     Baseline  10/25/18=20/24, 12/14/18=20/24    Time  8    Period  Weeks    Status  On-going    Target Date  01/23/19            Plan - 12/19/18 1451    Clinical Impression Statement  Pt demonstrates increased postural sway when standing on uneven surface and requires // bars to steady, and demonstrates fatigue at end of set of exercises focused on strength and endurance.  Patient will continue to benefit from skilled PT for improved balance and strength   Rehab Potential  Good    PT Frequency  2x / week    PT Duration  8 weeks    PT Treatment/Interventions  Gait training;Therapeutic exercise;Patient/family education;Neuromuscular re-education;Balance training    PT Next Visit Plan  balance and therapeutic exericse    PT Home Exercise Plan  hip abd standing,    Consulted and Agree with Plan of Care  Patient       Patient will benefit from skilled therapeutic intervention in order to improve the following deficits and impairments:  Abnormal gait, Decreased balance, Decreased endurance, Decreased mobility, Difficulty walking, Pain, Decreased strength, Decreased activity tolerance  Visit Diagnosis: Other abnormalities of gait and mobility  Muscle weakness (generalized)  Difficulty in walking, not elsewhere classified     Problem List Patient Active Problem  List   Diagnosis Date Noted  . IBS (irritable bowel syndrome) 03/09/2018  . Hypokalemia 03/09/2018  . Age-related osteoporosis without current pathological fracture 03/09/2018  . Baker's cyst of knee, right 06/06/2017  . Insomnia 03/07/2017  . Tobacco abuse 12/01/2016  . Hypertension   .   Hyperlipidemia   . MDD (major depressive disorder)   . Generalized anxiety disorder     Alanson Puls , [place] DPT 12/19/2018, 2:52 PM  [place] MAIN [facility] SERVICES [address] [place], [place], [address] Phone: [phone]   Fax:  [phone]  Name: Amanda Tucker MRN: [id number] Date of Birth: 1945/08/23

## 2018-12-26 ENCOUNTER — Other Ambulatory Visit: Payer: Self-pay

## 2018-12-26 ENCOUNTER — Encounter: Payer: Self-pay | Admitting: Physical Therapy

## 2018-12-26 ENCOUNTER — Ambulatory Visit: Payer: Medicare Other | Attending: Family Medicine | Admitting: Physical Therapy

## 2018-12-26 DIAGNOSIS — R2689 Other abnormalities of gait and mobility: Secondary | ICD-10-CM

## 2018-12-26 DIAGNOSIS — M6281 Muscle weakness (generalized): Secondary | ICD-10-CM | POA: Diagnosis present

## 2018-12-26 DIAGNOSIS — R262 Difficulty in walking, not elsewhere classified: Secondary | ICD-10-CM

## 2018-12-26 NOTE — Therapy (Signed)
[place] [facility] MA[facility] REHAB SERVICES [address] [place], [place], [address] Phone: [phone]   Fax:  [phone]  Physical Therapy Treatment  Patient Details  Name: Amanda Tucker MRN: [id number] Date of Birth: 05/06/1945 Referring Provider (PT): angela Bacigalupo   Encounter Date: 12/26/2018  PT End of Session - 12/26/18 1550    Visit Number  32    Number of Visits  33    Date for PT Re-Evaluation  01/22/19    PT Start Time  0230    PT Stop Time  0310    PT Time Calculation (min)  40 min    Equipment Utilized During Treatment  Gait belt    Activity Tolerance  Patient tolerated treatment well;Patient limited by fatigue    Behavior During Therapy  WFL for tasks assessed/performed       Past Medical History:  Diagnosis Date  . Anxiety   . Arthritis   . Balance problem   . Depression   . Diabetes mellitus without complication (HCC)   . Diverticulitis   . Environmental and seasonal allergies   . GERD (gastroesophageal reflux disease)   . HOH (hard of hearing)   . Hyperlipidemia   . Hypertension   . IBS (irritable bowel syndrome)   . Osteoporosis   . Right sided sciatica   . Vertigo     Past Surgical History:  Procedure Laterality Date  . CATARACT EXTRACTION W/PHACO Right 09/07/2018   Procedure: CATARACT EXTRACTION PHACO AND INTRAOCULAR LENS PLACEMENT;  Surgeon: Harrow, Brian, MD;  Location: [facility] ORS;  Service: Ophthalmology;  Laterality: Right;  US: 01:08.2 CDE: 11.02 Fluid Pack Lot # [id number]  . COLONOSCOPY WITH PROPOFOL N/A 02/27/2018   Procedure: COLONOSCOPY WITH PROPOFOL;  Surgeon: Anna, Kiran, MD;  Location: [facility] ENDOS[facility];  Service: Gastroenterology;  Laterality: N/A;    There were no vitals filed for this visit.  Subjective Assessment - 12/26/18 1548    Subjective  Patient is doing well today,    Pertinent History  Patient was doing better after her last therapy visitis and now is feeling unsteady and weak again.she doesnt  use a assistive device.    Limitations  Standing;Walking    Patient Stated Goals  to walk better    Currently in Pain?  No/denies    Pain Score  0-No pain    Pain Onset  Today       Treatment: Nu-step x 5 mins L2  side stepping left and right in parallel bars 10 feet x 3 standing on blue foam with head turns x 1 min  Standing feet together on blue foam with head turns x 1 min 1/2 foam roll balance with flat side up 30s x 2 reps 1/2 foam roll balance with flat side down 30s x 2 reps 1/2 foam roll tandem balance alternating forward LE 30s x 2 each LE forward Leg press 35 lbs x 20 x 2   Pt educated throughout session about proper posture and technique with exercises. Improved exercise technique, movement at target joints, use of target muscles after min to mod verbal, visual, tactile cues. CGA and Min to mod verbal cues used throughout with increased in postural sway and LOB most seen with narrow base of support and while on uneven surfaces. Continues to have balance deficits typical with diagnosis. Patient performs intermediate level exercises without pain behaviors and needs verbal cuing for postural alignment and head positioning                      PT Education - 12/26/18 1549    Education Details  HEP    Person(s) Educated  Patient    Methods  Explanation;Demonstration;Tactile cues;Verbal cues    Comprehension  Verbalized understanding;Returned demonstration;Verbal cues required;Tactile cues required;Need further instruction       PT Short Term Goals - 08/08/18 1503      PT SHORT TERM GOAL #1   Title  Patient will be independent in home exercise program to improve strength/mobility for better functional independence with ADLs.    Time  4    Period  Weeks    Status  Achieved    Target Date  07/03/18      PT SHORT TERM GOAL #2   Title  Patient will increase Berg Balance score by > 6 points to demonstrate decreased fall risk during functional activities.     Time  4    Period  Weeks    Status  Partially Met    Target Date  07/03/18        PT Long Term Goals - 12/14/18 1442      PT LONG TERM GOAL #1   Title   Patient will be independent with ascend/descend 12 steps using single UE in step over step pattern without LOB.    Baseline  needs BUE railings for 12 steps,12/14/18= able to use single UE    Time  8    Period  Weeks    Status  Partially Met    Target Date  01/23/19      PT LONG TERM GOAL #2   Title  Patient will increase ABC scale score >80% to demonstrate better functional mobility and better confidence with ADLs.     Baseline  10/25/18=75%, 12/14/18=91%    Time  8    Period  Weeks    Status  Achieved    Target Date  12/14/18      PT LONG TERM GOAL #3   Title  Patient will increase dynamic gait index score to >19/24 as to demonstrate reduced fall risk and improved dynamic gait balance for better safety with community/home ambulation.     Baseline  10/25/18=20/24, 12/14/18=20/24    Time  8    Period  Weeks    Status  On-going    Target Date  01/23/19            Plan - 12/26/18 1550    Clinical Impression Statement  Pt demonstrates increased postural sway when standing on uneven surface and requires UE to steady, and demonstrates fatigue at end of set of exercises.Patient focused on transferring weight to balls of feet.  Patient will continue to benefit from skilled PT for improved balance and strength.   Rehab Potential  Good    PT Frequency  2x / week    PT Duration  8 weeks    PT Treatment/Interventions  Gait training;Therapeutic exercise;Patient/family education;Neuromuscular re-education;Balance training    PT Next Visit Plan  balance and therapeutic exericse    PT Home Exercise Plan  hip abd standing,    Consulted and Agree with Plan of Care  Patient       Patient will benefit from skilled therapeutic intervention in order to improve the following deficits and impairments:  Abnormal gait, Decreased balance,  Decreased endurance, Decreased mobility, Difficulty walking, Pain, Decreased strength, Decreased activity tolerance  Visit Diagnosis: Other abnormalities of gait and mobility  Muscle weakness (generalized)  Difficulty in walking, not elsewhere classified     Problem List Patient   Active Problem List   Diagnosis Date Noted  . IBS (irritable bowel syndrome) 03/09/2018  . Hypokalemia 03/09/2018  . Age-related osteoporosis without current pathological fracture 03/09/2018  . Baker's cyst of knee, right 06/06/2017  . Insomnia 03/07/2017  . Tobacco abuse 12/01/2016  . Hypertension   . Hyperlipidemia   . MDD (major depressive disorder)   . Generalized anxiety disorder     Mansfield, Kristine S, PT DPT 12/26/2018, 3:51 PM  [place] [facility] MAIN REHAB SERVICES [address] [place], [place], [address] Phone: [phone]   Fax:  [phone]  Name: Amanda Tucker MRN: [id number] Date of Birth: 08/08/1945   

## 2018-12-28 ENCOUNTER — Encounter: Payer: Self-pay | Admitting: Physical Therapy

## 2018-12-28 ENCOUNTER — Ambulatory Visit: Payer: Medicare Other | Admitting: Physical Therapy

## 2018-12-28 ENCOUNTER — Other Ambulatory Visit: Payer: Self-pay

## 2018-12-28 DIAGNOSIS — R262 Difficulty in walking, not elsewhere classified: Secondary | ICD-10-CM

## 2018-12-28 DIAGNOSIS — R2689 Other abnormalities of gait and mobility: Secondary | ICD-10-CM | POA: Diagnosis not present

## 2018-12-28 DIAGNOSIS — M6281 Muscle weakness (generalized): Secondary | ICD-10-CM

## 2018-12-28 NOTE — Therapy (Signed)
[place] MAIN [facility] SERVICES [address] [place], [place], [address] Phone: [phone]   Fax:  [phone]  Physical Therapy Treatment  Patient Details  Name: Amanda Tucker MRN: [id number] Date of Birth: September 02, 1945 Referring Provider (PT): [name]   Encounter Date: 12/28/2018  PT End of Session - 12/28/18 1440    Visit Number  33    Number of Visits  33    Date for PT Re-Evaluation  01/22/19    Equipment Utilized During Treatment  Gait belt    Activity Tolerance  Patient tolerated treatment well;Patient limited by fatigue    Behavior During Therapy  [facility] for tasks assessed/performed       Past Medical History:  Diagnosis Date  . Anxiety   . Arthritis   . Balance problem   . Depression   . Diabetes mellitus without complication ([place])   . Diverticulitis   . Environmental and seasonal allergies   . GERD (gastroesophageal reflux disease)   . HOH (hard of hearing)   . Hyperlipidemia   . Hypertension   . IBS (irritable bowel syndrome)   . Osteoporosis   . Right sided sciatica   . Vertigo     Past Surgical History:  Procedure Laterality Date  . CATARACT EXTRACTION W/PHACO Right 09/07/2018   Procedure: CATARACT EXTRACTION PHACO AND INTRAOCULAR LENS PLACEMENT;  Surgeon: Marchia Meiers, MD;  Location: [facility] ORS;  Service: Ophthalmology;  Laterality: Right;  [place]: 01:08.2 CDE: 11.02 Fluid Pack Lot # [id number]  . COLONOSCOPY WITH PROPOFOL N/A 02/27/2018   Procedure: COLONOSCOPY WITH PROPOFOL;  Surgeon: Jonathon Bellows, MD;  Location: [facility] ENDOSCOPY;  Service: Gastroenterology;  Laterality: N/A;    There were no vitals filed for this visit.  Subjective Assessment - 12/28/18 1440    Subjective  Patient is doing well today,    Pertinent History  Patient was doing better after her last therapy visitis and now is feeling unsteady and weak again.she doesnt use a assistive device.    Limitations  Standing;Walking    Patient Stated Goals  to  walk better    Currently in Pain?  No/denies    Pain Score  0-No pain    Pain Onset  Today         Neuromuscular Re-education  Tandem gait on floor without UE support x 2 lengths Side stepping on foam without UE support x 2 lengths Heel/toe raises without UE support 3s hold x 10 each 1/2 foam roll balance with flat side up 30s x 2 reps 1/2 foam roll balance with flat side down 30s x 2 reps 1/2 foam roll tandem balance alternating forward LE 30s x 2 each LE forward Lateral side steps from foam to 6 inch stool left and right x 15 Backwards stepping from foam to 6 inch stool x 15  [place] fwd/bwd, side to side , diagonal x 10 ,cues for posture and stepping strategies, occasional LOB Star stepping left and right x 10       Pt educated throughout session about proper posture and technique with exercises. Improved exercise technique, movement at target joints, use of target muscles after min to mod verbal, visual, tactile cues. CGA and Min to mod verbal cues used throughout with increased in postural sway and LOB most seen with narrow base of support and while on uneven surfaces. Continues to have balance deficits typical with diagnosis. Patient performs intermediate level exercises without pain behaviors and needs verbal cuing for postural alignment and head  positioning                      PT Education - 12/28/18 1440    Education Details  HEP    Person(s) Educated  Patient    Methods  Explanation;Demonstration    Comprehension  Verbalized understanding;Returned demonstration;Verbal cues required       PT Short Term Goals - 08/08/18 1503      PT SHORT TERM GOAL #1   Title  Patient will be independent in home exercise program to improve strength/mobility for better functional independence with ADLs.    Time  4    Period  Weeks    Status  Achieved    Target Date  07/03/18      PT SHORT TERM GOAL #2   Title  Patient will increase Berg Balance score by > 6  points to demonstrate decreased fall risk during functional activities.    Time  4    Period  Weeks    Status  Partially Met    Target Date  07/03/18        PT Long Term Goals - 12/14/18 1442      PT LONG TERM GOAL #1   Title   Patient will be independent with ascend/descend 12 steps using single UE in step over step pattern without LOB.    Baseline  needs BUE railings for 12 steps,12/14/18= able to use single UE    Time  8    Period  Weeks    Status  Partially Met    Target Date  01/23/19      PT LONG TERM GOAL #2   Title  Patient will increase ABC scale score >80% to demonstrate better functional mobility and better confidence with ADLs.     Baseline  10/25/18=75%, 12/14/18=91%    Time  8    Period  Weeks    Status  Achieved    Target Date  12/14/18      PT LONG TERM GOAL #3   Title  Patient will increase dynamic gait index score to >19/24 as to demonstrate reduced fall risk and improved dynamic gait balance for better safety with community/home ambulation.     Baseline  10/25/18=20/24, 12/14/18=20/24    Time  8    Period  Weeks    Status  On-going    Target Date  01/23/19            Plan - 12/28/18 1444    Clinical Impression Statement  Patient instructed in beginning balance and coordination exercise. Patient required mod VCs and min A for gait to improve weight shift and postural control. Patient requires min VCs to improve ankle stability with transferring weight to balls of her feet with gait.  Patients would benefit from additional skilled PT intervention to improve balance/gait safety and reduce fall risk.   Rehab Potential  Good    PT Frequency  2x / week    PT Duration  8 weeks    PT Treatment/Interventions  Gait training;Therapeutic exercise;Patient/family education;Neuromuscular re-education;Balance training    PT Next Visit Plan  balance and therapeutic exericse    PT Home Exercise Plan  hip abd standing,    Consulted and Agree with Plan of Care  Patient        Patient will benefit from skilled therapeutic intervention in order to improve the following deficits and impairments:  Abnormal gait, Decreased balance, Decreased endurance, Decreased mobility, Difficulty walking, Pain, Decreased strength, Decreased  activity tolerance  Visit Diagnosis: Other abnormalities of gait and mobility  Muscle weakness (generalized)  Difficulty in walking, not elsewhere classified     Problem List Patient Active Problem List   Diagnosis Date Noted  . IBS (irritable bowel syndrome) 03/09/2018  . Hypokalemia 03/09/2018  . Age-related osteoporosis without current pathological fracture 03/09/2018  . Baker's cyst of knee, right 06/06/2017  . Insomnia 03/07/2017  . Tobacco abuse 12/01/2016  . Hypertension   . Hyperlipidemia   . MDD (major depressive disorder)   . Generalized anxiety disorder     Alanson Puls, [place] DPT 12/28/2018, 3:02 PM  [place] MAIN [facility] SERVICES [address] [place], [place], [address] Phone: [phone]   Fax:  [phone]  Name: Amanda Tucker MRN: [id number] Date of Birth: 1945/03/22

## 2019-01-01 ENCOUNTER — Inpatient Hospital Stay: Admission: RE | Admit: 2019-01-01 | Payer: Medicare Other | Source: Ambulatory Visit

## 2019-01-01 DIAGNOSIS — Z23 Encounter for immunization: Secondary | ICD-10-CM | POA: Diagnosis not present

## 2019-01-02 ENCOUNTER — Other Ambulatory Visit: Payer: Medicare Other

## 2019-01-04 ENCOUNTER — Ambulatory Visit: Payer: Medicare Other | Admitting: Physical Therapy

## 2019-01-09 ENCOUNTER — Ambulatory Visit: Payer: Medicare Other | Admitting: Physical Therapy

## 2019-01-10 ENCOUNTER — Ambulatory Visit: Payer: Medicare Other | Admitting: Physical Therapy

## 2019-01-11 ENCOUNTER — Ambulatory Visit: Payer: Medicare Other | Admitting: Physical Therapy

## 2019-01-16 ENCOUNTER — Ambulatory Visit: Payer: Medicare Other | Admitting: Physical Therapy

## 2019-01-16 ENCOUNTER — Other Ambulatory Visit: Payer: Self-pay

## 2019-01-16 DIAGNOSIS — R2689 Other abnormalities of gait and mobility: Secondary | ICD-10-CM | POA: Diagnosis not present

## 2019-01-16 DIAGNOSIS — R262 Difficulty in walking, not elsewhere classified: Secondary | ICD-10-CM

## 2019-01-16 DIAGNOSIS — M6281 Muscle weakness (generalized): Secondary | ICD-10-CM

## 2019-01-16 NOTE — Therapy (Signed)
[place] MAIN [facility] SERVICES [address] [place], [place], [address] Phone: [phone]   Fax:  [phone]  Physical Therapy Treatment / Discharge Summary   Patient Details  Name: Amanda Tucker MRN: [id number] Date of Birth: [birth year] Referring Provider (PT): [name]   Encounter Date: 01/16/2019  PT End of Session - 01/16/19 1541    Visit Number  34    Number of Visits  34    Date for PT Re-Evaluation  01/22/19    PT Start Time  0230    PT Stop Time  0315    PT Time Calculation (min)  45 min    Equipment Utilized During Treatment  Gait belt    Activity Tolerance  Patient tolerated treatment well;Patient limited by fatigue    Behavior During Therapy  [facility] for tasks assessed/performed       Past Medical History:  Diagnosis Date  . Anxiety   . Arthritis   . Balance problem   . Depression   . Diabetes mellitus without complication ([place])   . Diverticulitis   . Environmental and seasonal allergies   . GERD (gastroesophageal reflux disease)   . HOH (hard of hearing)   . Hyperlipidemia   . Hypertension   . IBS (irritable bowel syndrome)   . Osteoporosis   . Right sided sciatica   . Vertigo     Past Surgical History:  Procedure Laterality Date  . CATARACT EXTRACTION W/PHACO Right 09/07/2018   Procedure: CATARACT EXTRACTION PHACO AND INTRAOCULAR LENS PLACEMENT;  Surgeon: Marchia Meiers, MD;  Location: [facility] ORS;  Service: Ophthalmology;  Laterality: Right;  [place]: 01:08.2 CDE: 11.02 Fluid Pack Lot # [id number]  . COLONOSCOPY WITH PROPOFOL N/A 02/27/2018   Procedure: COLONOSCOPY WITH PROPOFOL;  Surgeon: Jonathon Bellows, MD;  Location: [facility] ENDOSCOPY;  Service: Gastroenterology;  Laterality: N/A;    There were no vitals filed for this visit.  Subjective Assessment - 01/16/19 1541    Subjective  Patient is doing well today,    Pertinent History  Patient was doing better after her last therapy visitis and now is feeling unsteady and  weak again.she doesnt use a assistive device.    Limitations  Standing;Walking    Patient Stated Goals  to walk better    Currently in Pain?  No/denies    Pain Onset  Today       Treatment: Therapeutic activities: DGI , assessment of ascending/descending steps Goals reached and DC to HEP Leg press 35 lbs x 20 x 3 Leg press heel raises 35 lbs x 20 x 3  Patients outcome measures reviewed and goals assessed.                        PT Education - 01/16/19 1541    Education Details  HEP    Person(s) Educated  Patient    Methods  Explanation    Comprehension  Verbalized understanding;Returned demonstration;Verbal cues required;Tactile cues required       PT Short Term Goals - 08/08/18 1503      PT SHORT TERM GOAL #1   Title  Patient will be independent in home exercise program to improve strength/mobility for better functional independence with ADLs.    Time  4    Period  Weeks    Status  Achieved    Target Date  07/03/18      PT SHORT TERM GOAL #2   Title  Patient will increase Berg Balance score  by > 6 points to demonstrate decreased fall risk during functional activities.    Time  4    Period  Weeks    Status  Partially Met    Target Date  07/03/18        PT Long Term Goals - 01/16/19 1432      PT LONG TERM GOAL #1   Title   Patient will be independent with ascend/descend 12 steps using single UE in step over step pattern without LOB.    Baseline  needs BUE railings for 12 steps,12/14/18= able to use single UE, 01/16/19= needs railing    Time  8    Period  Weeks    Status  Achieved    Target Date  01/16/19      PT LONG TERM GOAL #2   Title  Patient will increase ABC scale score >80% to demonstrate better functional mobility and better confidence with ADLs.     Baseline  10/25/18=75%, 12/14/18=91%, 01/16/19=    Time  8    Period  Weeks    Status  Achieved    Target Date  12/14/18      PT LONG TERM GOAL #3   Title  Patient will increase  dynamic gait index score to >19/24 as to demonstrate reduced fall risk and improved dynamic gait balance for better safety with community/home ambulation.     Baseline  10/25/18=20/24, 12/14/18=20/24, 01/16/19= 20/24    Time  8    Period  Weeks    Status  On-going    Target Date  01/23/19            Plan - 01/16/19 1449    Clinical Impression Statement  Patient performs outcome measures and goals are assessed. Patient's HEP is reviewed and DC is indicated. Patient has reached goals and can continue with HEP.    Rehab Potential  Good    PT Frequency  2x / week    PT Duration  8 weeks    PT Treatment/Interventions  Gait training;Therapeutic exercise;Patient/family education;Neuromuscular re-education;Balance training    PT Next Visit Plan  balance and therapeutic exericse    PT Home Exercise Plan  hip abd standing,    Consulted and Agree with Plan of Care  Patient       Patient will benefit from skilled therapeutic intervention in order to improve the following deficits and impairments:  Abnormal gait, Decreased balance, Decreased endurance, Decreased mobility, Difficulty walking, Pain, Decreased strength, Decreased activity tolerance  Visit Diagnosis: Other abnormalities of gait and mobility  Muscle weakness (generalized)  Difficulty in walking, not elsewhere classified     Problem List Patient Active Problem List   Diagnosis Date Noted  . IBS (irritable bowel syndrome) 03/09/2018  . Hypokalemia 03/09/2018  . Age-related osteoporosis without current pathological fracture 03/09/2018  . Baker's cyst of knee, right 06/06/2017  . Insomnia 03/07/2017  . Tobacco abuse 12/01/2016  . Hypertension   . Hyperlipidemia   . MDD (major depressive disorder)   . Generalized anxiety disorder     Alanson Puls, [place] DPT 01/16/2019, 3:43 PM  [place] MAIN [facility] SERVICES [address] [place], [place], [address] Phone: [phone]    Fax:  [phone]  Name: Evelin Cake MRN: [id number] Date of Birth: 12/31/1945

## 2019-01-22 ENCOUNTER — Other Ambulatory Visit: Payer: Self-pay | Admitting: Family Medicine

## 2019-01-22 MED ORDER — ALPRAZOLAM 0.5 MG PO TABS: ORAL_TABLET | ORAL | 0 refills | Status: DC

## 2019-01-22 NOTE — Telephone Encounter (Signed)
Medication Refill - Medication: ALPRAZolam (XANAX) 0.5 MG tablet    Has the patient contacted their pharmacy? Yes.   (Agent: If no, request that the patient contact the pharmacy for the refill.) (Agent: If yes, when and what did the pharmacy advise?)  Preferred Pharmacy (with phone number or street name):  [facility] [id number], [place] [address]  [place] [address]  Phone: [phone] Fax: [phone]     Agent: Please be advised that RX refills may take up to 3 business days. We ask that you follow-up with your pharmacy.

## 2019-01-22 NOTE — Telephone Encounter (Signed)
Please review for Dr. B 

## 2019-01-22 NOTE — Telephone Encounter (Signed)
Requested medication (s) are due for refill today: yes  Requested medication (s) are on the active medication list: yes  Last refill:  09/21/2018  Future visit scheduled: yes  Notes to clinic: This refill cannot be delegated    Requested Prescriptions  Pending Prescriptions Disp Refills   ALPRAZolam (XANAX) 0.5 MG tablet 30 tablet 3    Sig: TAKE 1/2 TABLET BY MOUTH TWICE DAILY AS NEEDED FOR ANXIETY      Not Delegated - Psychiatry:  Anxiolytics/Hypnotics Failed - 01/22/2019 11:21 AM      Failed - This refill cannot be delegated      Failed - Urine Drug Screen completed in last 360 days.      Failed - Valid encounter within last 6 months    Recent Outpatient Visits           6 months ago Acute non-recurrent frontal sinusitis   [place], [place], [place]   6 months ago Fever, unspecified fever cause   [facility] [place], [name], MD   6 months ago Essential hypertension   [facility] [place], [name], MD   7 months ago Essential hypertension   [facility] [place], [name], MD   10 months ago Essential hypertension   [place], [name], MD       Future Appointments             In 1 month [place], [name], MD [facility], [place]

## 2019-01-23 ENCOUNTER — Ambulatory Visit: Payer: Medicare Other | Admitting: Physical Therapy

## 2019-01-25 ENCOUNTER — Ambulatory Visit: Payer: Medicare Other | Admitting: Physical Therapy

## 2019-01-30 ENCOUNTER — Other Ambulatory Visit: Payer: Self-pay

## 2019-01-30 ENCOUNTER — Other Ambulatory Visit
Admission: RE | Admit: 2019-01-30 | Discharge: 2019-01-30 | Disposition: A | Discharge status: Home / Self Care | Payer: Medicare Other | Source: Ambulatory Visit | Attending: Ophthalmology | Admitting: Ophthalmology

## 2019-01-30 DIAGNOSIS — Z01812 Encounter for preprocedural laboratory examination: Secondary | ICD-10-CM | POA: Insufficient documentation

## 2019-01-30 DIAGNOSIS — Z20822 Contact with and (suspected) exposure to covid-19: Secondary | ICD-10-CM | POA: Diagnosis not present

## 2019-01-30 LAB — SARS CORONAVIRUS 2 (TAT 6-24 HRS): SARS Coronavirus 2: NEGATIVE

## 2019-02-01 ENCOUNTER — Encounter: Payer: Self-pay | Admitting: *Deleted

## 2019-02-01 ENCOUNTER — Ambulatory Visit
Admission: RE | Admit: 2019-02-01 | Discharge: 2019-02-01 | Disposition: A | Discharge status: Home / Self Care | Payer: Medicare Other | Attending: Ophthalmology | Admitting: Ophthalmology

## 2019-02-01 ENCOUNTER — Ambulatory Visit: Payer: Medicare Other | Admitting: Certified Registered"

## 2019-02-01 ENCOUNTER — Other Ambulatory Visit: Payer: Self-pay

## 2019-02-01 ENCOUNTER — Encounter
Admission: RE | Disposition: A | Discharge status: Home / Self Care | Payer: Self-pay | Source: Home / Self Care | Attending: Ophthalmology

## 2019-02-01 DIAGNOSIS — Z7982 Long term (current) use of aspirin: Secondary | ICD-10-CM | POA: Insufficient documentation

## 2019-02-01 DIAGNOSIS — I10 Essential (primary) hypertension: Secondary | ICD-10-CM | POA: Insufficient documentation

## 2019-02-01 DIAGNOSIS — Z87891 Personal history of nicotine dependence: Secondary | ICD-10-CM | POA: Diagnosis not present

## 2019-02-01 DIAGNOSIS — E785 Hyperlipidemia, unspecified: Secondary | ICD-10-CM | POA: Insufficient documentation

## 2019-02-01 DIAGNOSIS — F419 Anxiety disorder, unspecified: Secondary | ICD-10-CM | POA: Diagnosis not present

## 2019-02-01 DIAGNOSIS — F329 Major depressive disorder, single episode, unspecified: Secondary | ICD-10-CM | POA: Diagnosis not present

## 2019-02-01 DIAGNOSIS — H2512 Age-related nuclear cataract, left eye: Secondary | ICD-10-CM | POA: Diagnosis not present

## 2019-02-01 DIAGNOSIS — E1136 Type 2 diabetes mellitus with diabetic cataract: Secondary | ICD-10-CM | POA: Diagnosis not present

## 2019-02-01 DIAGNOSIS — Z79899 Other long term (current) drug therapy: Secondary | ICD-10-CM | POA: Insufficient documentation

## 2019-02-01 DIAGNOSIS — M810 Age-related osteoporosis without current pathological fracture: Secondary | ICD-10-CM | POA: Insufficient documentation

## 2019-02-01 DIAGNOSIS — H269 Unspecified cataract: Secondary | ICD-10-CM | POA: Diagnosis not present

## 2019-02-01 HISTORY — DX: Irritable bowel syndrome, unspecified: K58.9

## 2019-02-01 HISTORY — PX: CATARACT EXTRACTION W/PHACO: SHX586

## 2019-02-01 SURGERY — PHACOEMULSIFICATION, CATARACT, WITH IOL INSERTION
Anesthesia: Monitor Anesthesia Care | Laterality: Left

## 2019-02-01 MED ORDER — PROVISC 10 MG/ML IO SOLN
INTRAOCULAR | Status: AC
  Filled 2019-02-01: qty 0.55

## 2019-02-01 MED ORDER — EPINEPHRINE PF 1 MG/ML IJ SOLN
INTRAMUSCULAR | Status: AC
  Filled 2019-02-01: qty 2

## 2019-02-01 MED ORDER — MOXIFLOXACIN HCL 0.5 % OP SOLN - NO CHARGE
1.0000 [drp] | Freq: Once | OPHTHALMIC | Status: DC
  Filled 2019-02-01: qty 3

## 2019-02-01 MED ORDER — TETRACAINE HCL 0.5 % OP SOLN
OPHTHALMIC | Status: AC
  Filled 2019-02-01: qty 4

## 2019-02-01 MED ORDER — TETRACAINE HCL 0.5 % OP SOLN
1.0000 [drp] | OPHTHALMIC | Status: AC | PRN
  Administered 2019-02-01 (×2): 1 [drp] via OPHTHALMIC

## 2019-02-01 MED ORDER — LIDOCAINE HCL (PF) 4 % IJ SOLN
INTRAMUSCULAR | Status: AC
  Filled 2019-02-01: qty 5

## 2019-02-01 MED ORDER — LIDOCAINE HCL (PF) 4 % IJ SOLN
INTRAOCULAR | Status: DC | PRN
  Administered 2019-02-01: 09:00:00 2 mL via OPHTHALMIC

## 2019-02-01 MED ORDER — NEOMYCIN-POLYMYXIN-DEXAMETH 3.5-10000-0.1 OP OINT
TOPICAL_OINTMENT | OPHTHALMIC | Status: AC
  Filled 2019-02-01: qty 3.5

## 2019-02-01 MED ORDER — MOXIFLOXACIN HCL 0.5 % OP SOLN
OPHTHALMIC | Status: AC
  Filled 2019-02-01: qty 3

## 2019-02-01 MED ORDER — NA CHONDROIT SULF-NA HYALURON 40-17 MG/ML IO SOLN
INTRAOCULAR | Status: DC | PRN
  Administered 2019-02-01: 1 mL via INTRAOCULAR

## 2019-02-01 MED ORDER — MIDAZOLAM HCL 5 MG/5ML IJ SOLN
INTRAMUSCULAR | Status: DC | PRN
  Administered 2019-02-01 (×4): .5 mg via INTRAVENOUS

## 2019-02-01 MED ORDER — ARMC OPHTHALMIC DILATING DROPS
1.0000 "application " | OPHTHALMIC | Status: AC
  Administered 2019-02-01 (×3): 1 via OPHTHALMIC

## 2019-02-01 MED ORDER — MOXIFLOXACIN HCL 0.5 % OP SOLN
OPHTHALMIC | Status: DC | PRN
  Administered 2019-02-01: 0.2 mL via OPHTHALMIC

## 2019-02-01 MED ORDER — TRYPAN BLUE 0.06 % OP SOLN
OPHTHALMIC | Status: DC | PRN
  Administered 2019-02-01: 0.5 mL via INTRAOCULAR

## 2019-02-01 MED ORDER — ONDANSETRON HCL 4 MG/2ML IJ SOLN
INTRAMUSCULAR | Status: DC | PRN
  Administered 2019-02-01: 4 mg via INTRAVENOUS

## 2019-02-01 MED ORDER — TRYPAN BLUE 0.06 % OP SOLN
OPHTHALMIC | Status: AC
  Filled 2019-02-01: qty 0.5

## 2019-02-01 MED ORDER — EPINEPHRINE PF 1 MG/ML IJ SOLN
INTRAOCULAR | Status: DC | PRN
  Administered 2019-02-01: 200 mL via OPHTHALMIC

## 2019-02-01 MED ORDER — SODIUM CHLORIDE 0.9 % IV SOLN: INTRAVENOUS | Status: DC

## 2019-02-01 MED ORDER — POVIDONE-IODINE 5 % OP SOLN
OPHTHALMIC | Status: DC | PRN
  Administered 2019-02-01: 1 via OPHTHALMIC

## 2019-02-01 MED ORDER — MIDAZOLAM HCL 2 MG/2ML IJ SOLN
INTRAMUSCULAR | Status: AC
  Filled 2019-02-01: qty 2

## 2019-02-01 MED ORDER — ARMC OPHTHALMIC DILATING DROPS
OPHTHALMIC | Status: AC
  Filled 2019-02-01: qty 0.5

## 2019-02-01 MED ORDER — NA CHONDROIT SULF-NA HYALURON 40-17 MG/ML IO SOLN
INTRAOCULAR | Status: AC
  Filled 2019-02-01: qty 1

## 2019-02-01 SURGICAL SUPPLY — 17 items
DISSECTOR HYDRO NUCLEUS 50X22 (MISCELLANEOUS) ×12 IMPLANT
DRSG TEGADERM 2-3/8X2-3/4 SM (GAUZE/BANDAGES/DRESSINGS) ×3 IMPLANT
GLOVE BIOGEL M 6.5 STRL (GLOVE) ×3 IMPLANT
GOWN STRL REUS W/ TWL LRG LVL3 (GOWN DISPOSABLE) ×1 IMPLANT
GOWN STRL REUS W/ TWL XL LVL3 (GOWN DISPOSABLE) ×1 IMPLANT
GOWN STRL REUS W/TWL LRG LVL3 (GOWN DISPOSABLE) ×2
GOWN STRL REUS W/TWL XL LVL3 (GOWN DISPOSABLE) ×2
KNIFE 45D UP 2.3 (MISCELLANEOUS) ×3 IMPLANT
LABEL CATARACT MEDS ST (LABEL) ×3 IMPLANT
LENS IOL TECNIS ITEC 22.5 (Intraocular Lens) ×2 IMPLANT
PACK CATARACT (MISCELLANEOUS) ×3 IMPLANT
PACK CATARACT KING (MISCELLANEOUS) ×3 IMPLANT
PACK EYE AFTER SURG (MISCELLANEOUS) ×3 IMPLANT
SOL BSS BAG (MISCELLANEOUS) ×3
SOLUTION BSS BAG (MISCELLANEOUS) ×1 IMPLANT
WATER STERILE IRR 250ML POUR (IV SOLUTION) ×3 IMPLANT
WIPE NON LINTING 3.25X3.25 (MISCELLANEOUS) ×3 IMPLANT

## 2019-02-01 NOTE — H&P (Signed)
   I have reviewed the patient's H&P and agree with its findings. There have been no interval changes.  Zamiya Dillard MD Ophthalmology 

## 2019-02-01 NOTE — Op Note (Signed)
  PREOPERATIVE DIAGNOSIS:  Nuclear sclerotic cataract of the LEFT eye.   POSTOPERATIVE DIAGNOSIS:  Nuclear sclerotic cataract of the LEFT eye.   OPERATIVE PROCEDURE: Cataract surgery OS   SURGEON:  Marchia Meiers, MD.   ANESTHESIA:  Anesthesiologist: Arita Miss, MD CRNA: Disser, Dierdre Forth, CRNA  1.      Managed anesthesia care. 2.     0.46ml of Shugarcaine was instilled following the paracentesis   COMPLICATIONS:  None.   TECHNIQUE:   Divide and conquer   DESCRIPTION OF PROCEDURE:  The patient was examined and consented in the preoperative holding area where the aforementioned topical anesthesia was applied to the LEFT eye and then brought back to the Operating Room where the left eye was prepped and draped in the usual sterile ophthalmic fashion and a lid speculum was placed. A paracentesis was created with the side port blade, the anterior chamber was washed out with trypan blue to stain the anterior capsule, and the anterior chamber was filled with viscoelastic. A near clear corneal incision was performed with the steel keratome. A continuous curvilinear capsulorrhexis was performed with a cystotome followed by the capsulorrhexis forceps. Hydrodissection and hydrodelineation were carried out with BSS on a blunt cannula. The lens was removed in a divide and conquer  technique and the remaining cortical material was removed with the irrigation-aspiration handpiece. The capsular bag was inflated with viscoelastic and the lens was placed in the capsular bag without complication. The remaining viscoelastic was removed from the eye with the irrigation-aspiration handpiece. The wounds were hydrated. The anterior chamber was flushed and the eye was inflated to physiologic pressure. 0.10ml Vigamox was placed in the anterior chamber. The wounds were found to be water tight. The eye was dressed with Vigamox. The patient was given protective glasses to wear throughout the day and a shield with which to sleep  tonight. The patient was also given drops with which to begin a drop regimen today and will follow-up with me in one day. Implant Name Type Inv. Item Serial No. Manufacturer Lot No. LRB No. Used Action  LENS IOL DIOP 22.5 - [id number] Intraocular Lens LENS IOL DIOP 22.5 [id number]  Left 1 Implanted    Procedure(s) with comments: CATARACT EXTRACTION PHACO AND INTRAOCULAR LENS PLACEMENT (IOC) LEFT VISION BLUE (Left) - Lot #:  [id number] H [place]:  00.51.3 CDE:  6.98  Electronically signed: Marchia Meiers 02/01/2019 10:44 AM

## 2019-02-01 NOTE — Transfer of Care (Signed)
Immediate Anesthesia Transfer of Care Note  Patient: Amanda Tucker  Procedure(s) Performed: CATARACT EXTRACTION PHACO AND INTRAOCULAR LENS PLACEMENT (IOC) LEFT VISION BLUE (Left )  Patient Location: PACU  Anesthesia Type:MAC  Level of Consciousness: awake, alert  and oriented  Airway & Oxygen Therapy: Patient Spontanous Breathing  Post-op Assessment: Report given to RN and Post -op Vital signs reviewed and stable  Post vital signs: Reviewed and stable  Last Vitals:  Vitals Value Taken Time  BP 122/74 02/01/19 0957  Temp 36.5 C 02/01/19 0957  Pulse 61 02/01/19 0957  Resp 16 02/01/19 0957  SpO2 100 % 02/01/19 0957    Last Pain:  Vitals:   02/01/19 0957  TempSrc: Temporal  PainSc: 0-No pain         Complications: No apparent anesthesia complications

## 2019-02-01 NOTE — Anesthesia Postprocedure Evaluation (Signed)
Anesthesia Post Note  Patient: Amanda Tucker  Procedure(s) Performed: CATARACT EXTRACTION PHACO AND INTRAOCULAR LENS PLACEMENT (IOC) LEFT VISION BLUE (Left )  Patient location during evaluation: PACU Anesthesia Type: MAC Level of consciousness: awake, awake and alert, oriented and patient cooperative Pain management: pain level controlled Vital Signs Assessment: post-procedure vital signs reviewed and stable Respiratory status: spontaneous breathing and nonlabored ventilation Cardiovascular status: stable Postop Assessment: no headache Anesthetic complications: no     Last Vitals:  Vitals:   02/01/19 0806 02/01/19 0957  BP: 117/75 122/74  Pulse: 65 61  Resp: 18 16  Temp: (!) 36.4 C 36.5 C  SpO2: 100% 100%    Last Pain:  Vitals:   02/01/19 0957  TempSrc: Temporal  PainSc: 0-No pain                 Dierdre Forth [place]

## 2019-02-01 NOTE — Discharge Instructions (Signed)
Eye Surgery Discharge Instructions    Expect mild scratchy sensation or mild soreness. DO NOT RUB YOUR EYE!  The day of surgery: . Minimal physical activity, but bed rest is not required . No reading, computer work, or close hand work . No bending, lifting, or straining. . May watch TV  For 24 hours: . No driving, legal decisions, or alcoholic beverages . Safety precautions . Eat anything you prefer: It is better to start with liquids, then soup then solid foods. . _____ Eye patch should be worn until postoperative exam tomorrow. . ____ Solar shield eyeglasses should be worn for comfort in the sunlight/patch while sleeping  Resume all regular medications including aspirin or Coumadin if these were discontinued prior to surgery. You may shower, bathe, shave, or wash your hair. Tylenol may be taken for mild discomfort.  Call your doctor if you experience significant pain, nausea, or vomiting, fever > 101 or other signs of infection. [phone] or [phone] Specific instructions:  Follow-up Information    Marchia Meiers, MD Follow up on 02/02/2019.   Specialty: Ophthalmology Why: @ 1:10 pm for post op visit. Contact information: [place] [address] [phone]

## 2019-02-01 NOTE — Anesthesia Preprocedure Evaluation (Addendum)
Anesthesia Evaluation  Patient identified by MRN, date of birth, ID band Patient awake    Reviewed: Allergy & Precautions, NPO status , Patient's Chart, lab work & pertinent test results  History of Anesthesia Complications Negative for: history of anesthetic complications  Airway Mallampati: II  TM Distance: >3 FB Neck ROM: Full    Dental no notable dental hx. (+) Teeth Intact, Loose, Dental Advisory Given,    Pulmonary neg pulmonary ROS, neg sleep apnea, neg COPD, Patient abstained from smoking.Not current smoker, former smoker,    Pulmonary exam normal breath sounds clear to auscultation       Cardiovascular Exercise Tolerance: Good METShypertension, Pt. on medications (-) CAD and (-) Past MI negative cardio ROS  (-) dysrhythmias  Rhythm:Regular Rate:Normal - Systolic murmurs    Neuro/Psych Anxiety Depression negative neurological ROS  negative psych ROS   GI/Hepatic GERD  Controlled,(+)     (-) substance abuse  ,   Endo/Other  diabetes  Renal/GU negative Renal ROS     Musculoskeletal   Abdominal   Peds  Hematology   Anesthesia Other Findings Past Medical History: No date: Anxiety No date: Arthritis No date: Balance problem No date: Depression No date: Diabetes mellitus without complication (H[facility]) No date: Diverticulitis No date: Environmental and seasonal allergies No date: GERD (gastroesophageal reflux disease) No date: HOH (hard of hearing) No date: Hyperlipidemia No date: Hypertension No date: IBS (irritable bowel syndrome) No date: Osteoporosis No date: Right sided sciatica No date: Vertigo  Reproductive/Obstetrics                             Anesthesia Physical Anesthesia Plan  ASA: II  Anesthesia Plan: MAC   Post-op Pain Management:    Induction: Intravenous  PONV Risk Score and Plan: 2 and Midazolam  Airway Management Planned: Nasal Cannula  Additional  Equipment:   Intra-op Plan:   Post-operative Plan:   Informed Consent: I have reviewed the patients History and Physical, chart, labs and discussed the procedure including the risks, benefits and alternatives for the proposed anesthesia with the patient or authorized representative who has indicated his/her understanding and acceptance.       Plan Discussed with: CRNA and Surgeon  Anesthesia Plan Comments: (Explained risks of anesthesia, including PONV, and rare emergencies requiring invasive intervention. Patient understands. )        Anesthesia Quick Evaluation

## 2019-02-20 ENCOUNTER — Other Ambulatory Visit: Payer: Self-pay | Admitting: Family Medicine

## 2019-02-20 NOTE — Telephone Encounter (Signed)
rx refill ALPRAZolam (XANAX) 0.5 MG tablet  PHARMACY [facility] [id number] [address], [address] [place] Phone:  [phone]  Fax:  [phone]

## 2019-02-21 MED ORDER — ALPRAZOLAM 0.5 MG PO TABS: ORAL_TABLET | ORAL | 0 refills | Status: DC

## 2019-02-28 NOTE — Progress Notes (Signed)
Subjective:   Amanda Tucker is a 74 y.o. female who presents for Medicare Annual (Subsequent) preventive examination.    This visit is being conducted through telemedicine due to the COVID-19 pandemic. This patient has given me verbal consent via doximity to conduct this visit, patient states they are participating from their home address. Some vital signs may be absent or patient reported.    Patient identification: identified by name, DOB, and current address  Review of Systems:  N/A  Cardiac Risk Factors include: advanced age (>25men, >71 women);dyslipidemia;hypertension;smoking/ tobacco exposure     Objective:     Vitals: There were no vitals taken for this visit.  There is no height or weight on file to calculate BMI. Unable to obtain vitals due to visit being conducted via telephonically.   Advanced Directives 03/01/2019 02/01/2019 06/12/2018 02/27/2018 12/28/2017 12/07/2017  Does Patient Have a Medical Advance Directive? No No No No No No  Would patient like information on creating a medical advance directive? No - Patient declined No - Patient declined No - Patient declined - - No - Patient declined    Tobacco Social History   Tobacco Use  Smoking Status Current Some Day Smoker  . Packs/day: 0.25  . Years: 54.00  . Pack years: 13.50  . Types: Cigarettes  . Last attempt to quit: 02/08/2018  . Years since quitting: 1.0  Smokeless Tobacco Never Used  Tobacco Comment   smokes 3 cigarettes out of a pack then throws away     Ready to quit: Not Answered Counseling given: Not Answered Comment: smokes 3 cigarettes out of a pack then throws away   Clinical Intake:  Pre-visit preparation completed: Yes  Pain : No/denies pain Pain Score: 0-No pain     Nutritional Risks: Nausea/ vomitting/ diarrhea(Diarrhea rarely due to IBS.) Diabetes: No  How often do you need to have someone help you when you read instructions, pamphlets, or other written materials from your doctor or  pharmacy?: 1 - Never  Interpreter Needed?: No  Information entered by :: [facility], LPN  Past Medical History:  Diagnosis Date  . Anxiety   . Arthritis   . Balance problem   . Depression   . Diabetes mellitus without complication ([place])   . Diverticulitis   . Environmental and seasonal allergies   . GERD (gastroesophageal reflux disease)   . HOH (hard of hearing)   . Hyperlipidemia   . Hypertension   . IBS (irritable bowel syndrome)   . Osteoporosis   . Right sided sciatica   . Vertigo    Past Surgical History:  Procedure Laterality Date  . CATARACT EXTRACTION W/PHACO Right 09/07/2018   Procedure: CATARACT EXTRACTION PHACO AND INTRAOCULAR LENS PLACEMENT;  Surgeon: Marchia Meiers, MD;  Location: [facility] ORS;  Service: Ophthalmology;  Laterality: Right;  [place]: 01:08.2 CDE: 11.02 Fluid Pack Lot # [id number]  . CATARACT EXTRACTION W/PHACO Left 02/01/2019   Procedure: CATARACT EXTRACTION PHACO AND INTRAOCULAR LENS PLACEMENT ([place]) LEFT VISION BLUE;  Surgeon: Marchia Meiers, MD;  Location: [facility] ORS;  Service: Ophthalmology;  Laterality: Left;  Lot #:  [id number] [place]:  00.51.3 CDE:  6.98  . COLONOSCOPY WITH PROPOFOL N/A 02/27/2018   Procedure: COLONOSCOPY WITH PROPOFOL;  Surgeon: Jonathon Bellows, MD;  Location: [facility];  Service: Gastroenterology;  Laterality: N/A;   Family History  Problem Relation Age of Onset  . Dementia Mother 45  . Hypertension Father   . Hyperlipidemia Father   . Heart attack Father 67  . Pancreatic cancer  Brother   . CVA Maternal Grandfather   . Hypothyroidism Daughter        [place]  . Breast cancer Neg Hx   . Colon cancer Neg Hx    Social History   Socioeconomic History  . Marital status: Divorced    Spouse name: Not on file  . Number of children: 2  . Years of education: Not on file  . Highest education level: Some college, no degree  Occupational History  . Occupation: retired  Tobacco Use  . Smoking status: Current Some Day Smoker    Packs/day:  0.25    Years: 54.00    Pack years: 13.50    Types: Cigarettes    Last attempt to quit: 02/08/2018    Years since quitting: 1.0  . Smokeless tobacco: Never Used  . Tobacco comment: smokes 3 cigarettes out of a pack then throws away  Substance and Sexual Activity  . Alcohol use: Yes    Alcohol/week: 5.0 standard drinks    Types: 5 Glasses of wine per week  . Drug use: Never  . Sexual activity: Never  Other Topics Concern  . Not on file  Social History Narrative  . Not on file   Social Determinants of Health   Financial Resource Strain: Low Risk   . Difficulty of Paying Living Expenses: Not hard at all  Food Insecurity: No Food Insecurity  . Worried About Charity fundraiser in the Last Year: Never true  . Ran Out of Food in the Last Year: Never true  Transportation Needs: No Transportation Needs  . Lack of Transportation (Medical): No  . Lack of Transportation (Non-Medical): No  Physical Activity: Inactive  . Days of Exercise per Week: 0 days  . Minutes of Exercise per Session: 0 min  Stress: Stress Concern Present  . Feeling of Stress : To some extent  Social Connections: Somewhat Isolated  . Frequency of Communication with Friends and Family: More than three times a week  . Frequency of Social Gatherings with Friends and Family: More than three times a week  . Attends Religious Services: More than 4 times per year  . Active Member of Clubs or Organizations: No  . Attends Archivist Meetings: Never  . Marital Status: Divorced    Outpatient Encounter Medications as of 03/01/2019  Medication Sig  . alendronate (FOSAMAX) 70 MG tablet Take 1 tablet (70 mg total) by mouth once a week. Take with a full glass of water on an empty stomach. (Patient taking differently: Take 70 mg by mouth once a week. Take with a full glass of water on an empty stomach. TUESDAY)  . ALPRAZolam (XANAX) 0.5 MG tablet TAKE 1/2 TABLET BY MOUTH TWICE DAILY AS NEEDED FOR ANXIETY  . amLODipine  (NORVASC) 5 MG tablet Take 1 tablet (5 mg total) by mouth daily.  Marland Kitchen aspirin EC 81 MG tablet Take 81 mg daily by mouth.  Marland Kitchen aspirin-acetaminophen-caffeine (EXCEDRIN MIGRAINE) 250-250-65 MG tablet Take 2 tablets by mouth as needed for headache.  . ibuprofen (ADVIL) 200 MG tablet Take 200 mg by mouth every 6 (six) hours as needed.  Marland Kitchen lisinopril (PRINIVIL,ZESTRIL) 40 MG tablet Take 1 tablet (40 mg total) by mouth daily.  . Multiple Vitamin (MULTIVITAMIN) tablet Take 1 tablet by mouth daily.  . sertraline (ZOLOFT) 100 MG tablet Take 1 tablet (100 mg total) by mouth daily.  . simvastatin (ZOCOR) 20 MG tablet TAKE 1 TABLET(20 MG) BY MOUTH DAILY AT 6 PM  .  VITAMIN D PO Take by mouth every morning.   No facility-administered encounter medications on file as of 03/01/2019.    Activities of Daily Living In your present state of health, do you have any difficulty performing the following activities: 03/01/2019  Hearing? N  Vision? N  Difficulty concentrating or making decisions? N  Walking or climbing stairs? N  Dressing or bathing? N  Doing errands, shopping? N  Preparing Food and eating ? N  Using the Toilet? N  In the past six months, have you accidently leaked urine? N  Do you have problems with loss of bowel control? N  Managing your Medications? N  Managing your Finances? N  Housekeeping or managing your Housekeeping? N  Some recent data might be hidden    Patient Care Team: Bacigalupo, [name], MD as PCP - General (Family Medicine) [facility], [facility])    Assessment:   This is a routine wellness examination for [facility].  Exercise Activities and Dietary recommendations Current Exercise Habits: Home exercise routine, Type of exercise: yoga;stretching, Time (Minutes): 15, Frequency (Times/Week): 5, Weekly Exercise (Minutes/Week): 75, Intensity: Mild, Exercise limited by: orthopedic condition(s)  Goals    . Quit Smoking     Recommend to continue efforts to reduce smoking  habits until no longer smoking (Smoking Cessation literature attached to AVS).         Fall Risk: Fall Risk  03/01/2019 12/07/2017 12/01/2016  Falls in the past year? 0 1 No  Number falls in past yr: 0 1 -  Injury with Fall? 0 0 -  Risk for fall due to : - Impaired balance/gait -  Risk for fall due to: Comment - Pt needs a PT evaluation due to imparied gait.  -    FALL RISK PREVENTION PERTAINING TO THE HOME:  Any stairs in or around the home? Yes  If so, are there any without handrails? No   Home free of loose throw rugs in walkways, pet beds, electrical cords, etc? Yes  Adequate lighting in your home to reduce risk of falls? Yes   ASSISTIVE DEVICES UTILIZED TO PREVENT FALLS:  Life alert? No  Use of a cane, walker or w/c? No  Grab bars in the bathroom? No  Shower chair or bench in shower? No  Elevated toilet seat or a handicapped toilet? No    TIMED UP AND GO:  Was the test performed? No .    Depression Screen PHQ 2/9 Scores 03/01/2019 03/01/2019 03/09/2018 12/07/2017  PHQ - 2 Score 0 0 1 1  PHQ- 9 Score - - 6 -     Cognitive Function     6CIT Screen 03/01/2019 12/07/2017  What Year? 0 points 0 points  What month? 0 points 0 points  What time? 0 points 0 points  Count back from 20 0 points 0 points  Months in reverse 0 points 0 points  Repeat phrase 0 points 2 points  Total Score 0 2    Immunization History  Administered Date(s) Administered  . Influenza, High Dose Seasonal PF 12/01/2016, 12/07/2017  . Influenza,inj,Quad PF,6+ Mos 11/26/2018  . Pneumococcal Conjugate-13 12/01/2016  . Pneumococcal Polysaccharide-23 12/07/2017    Qualifies for Shingles Vaccine? Yes . Due for Shingrix. Pt has been advised to call insurance company to determine out of pocket expense. Advised may also receive vaccine at local pharmacy or Health Dept. Verbalized acceptance and understanding.  Tdap: Although this vaccine is not a covered service during a Wellness Exam, does the  patient still wish to receive this vaccine today?  No . Advised may receive this vaccine at local pharmacy or Health Dept. Aware to provide a copy of the vaccination record if obtained from local pharmacy or Health Dept. Verbalized acceptance and understanding.  Flu Vaccine: Up to date  Pneumococcal Vaccine: Completed series  Screening Tests Health Maintenance  Topic Date Due  . MAMMOGRAM  09/04/1994  . TETANUS/TDAP  12/08/2022 (Originally 09/04/1964)  . DEXA SCAN  03/07/2020  . COLONOSCOPY  02/28/2023  . INFLUENZA VACCINE  Completed  . Hepatitis C Screening  Completed  . PNA vac Low Risk Adult  Completed    Cancer Screenings:  Colorectal Screening: Completed 02/27/18. Repeat every 5 years.   Mammogram: Currently due. Pt declined order today.   Bone Density: Completed 03/07/18. Results reflect OSTEOPOROSIS. Repeat every 2 years.   Lung Cancer Screening: (Low Dose CT Chest recommended if Age 67-80 years, 30 pack-year currently smoking OR have quit w/in 15years.) does qualify however declines order at this time.   Additional Screening:  Hepatitis C Screening: Up to date  Vision Screening: Recommended annual ophthalmology exams for early detection of glaucoma and other disorders of the eye.  Dental Screening: Recommended annual dental exams for proper oral hygiene  Community Resource Referral:  CRR required this visit?  No       Plan:  I have personally reviewed and addressed the Medicare Annual Wellness questionnaire and have noted the following in the patient's chart:  A. Medical and social history B. Use of alcohol, tobacco or illicit drugs  C. Current medications and supplements D. Functional ability and status E.  Nutritional status F.  Physical activity G. Advance directives H. List of other physicians I.  Hospitalizations, surgeries, and ER visits in previous 12 months J.  Lindy such as hearing and vision if needed, cognitive and  depression L. Referrals and appointments   In addition, I have reviewed and discussed with patient certain preventive protocols, quality metrics, and best practice recommendations. A written personalized care plan for preventive services as well as general preventive health recommendations were provided to patient. Nurse Health Advisor  Signed,    Ovila Lepage [place], [place]  [id number] Nurse Health Advisor   Nurse Notes: Pt declined a mammogram order today.

## 2019-03-01 ENCOUNTER — Other Ambulatory Visit: Payer: Self-pay

## 2019-03-01 ENCOUNTER — Ambulatory Visit (INDEPENDENT_AMBULATORY_CARE_PROVIDER_SITE_OTHER): Payer: Medicare Other

## 2019-03-01 DIAGNOSIS — Z0000 Encounter for general adult medical examination without abnormal findings: Secondary | ICD-10-CM | POA: Diagnosis not present

## 2019-03-01 NOTE — Patient Instructions (Signed)
Amanda Tucker , Thank you for taking time to come for your Medicare Wellness Visit. I appreciate your ongoing commitment to your health goals. Please review the following plan we discussed and let me know if I can assist you in the future.   Screening recommendations/referrals: Colonoscopy: Up to date, due 02/2023 Mammogram: Currently due- declined order today.  Bone Density: Up to date, due 02/2020 Recommended yearly ophthalmology/optometry visit for glaucoma screening and checkup Recommended yearly dental visit for hygiene and checkup  Vaccinations: Influenza vaccine: Up to date Pneumococcal vaccine: Completed series Tdap vaccine: Pt declines today.  Shingles vaccine: Pt declines today.     Advanced directives: Advance directive discussed with you today. Even though you declined this today please call our office should you change your mind and we can give you the proper paperwork for you to fill out.  Conditions/risks identified: Smoking cessation discussed today.   Next appointment: 03/08/19 @ 2:00 PM with Dr Brita Romp. Declined scheduling an AWV for 2022 at this time.    Preventive Care 16 Years and Older, Female Preventive care refers to lifestyle choices and visits with your health care provider that can promote health and wellness. What does preventive care include?  A yearly physical exam. This is also called an annual well check.  Dental exams once or twice a year.  Routine eye exams. Ask your health care provider how often you should have your eyes checked.  Personal lifestyle choices, including:  Daily care of your teeth and gums.  Regular physical activity.  Eating a healthy diet.  Avoiding tobacco and drug use.  Limiting alcohol use.  Practicing safe sex.  Taking low-dose aspirin every day.  Taking vitamin and mineral supplements as recommended by your health care provider. What happens during an annual well check? The services and screenings done by your  health care provider during your annual well check will depend on your age, overall health, lifestyle risk factors, and family history of disease. Counseling  Your health care provider may ask you questions about your:  Alcohol use.  Tobacco use.  Drug use.  Emotional well-being.  Home and relationship well-being.  Sexual activity.  Eating habits.  History of falls.  Memory and ability to understand (cognition).  Work and work Statistician.  Reproductive health. Screening  You may have the following tests or measurements:  Height, weight, and BMI.  Blood pressure.  Lipid and cholesterol levels. These may be checked every 5 years, or more frequently if you are over 2 years old.  Skin check.  Lung cancer screening. You may have this screening every year starting at age 41 if you have a 30-pack-year history of smoking and currently smoke or have quit within the past 15 years.  Fecal occult blood test (FOBT) of the stool. You may have this test every year starting at age 39.  Flexible sigmoidoscopy or colonoscopy. You may have a sigmoidoscopy every 5 years or a colonoscopy every 10 years starting at age 59.  Hepatitis C blood test.  Hepatitis B blood test.  Sexually transmitted disease (STD) testing.  Diabetes screening. This is done by checking your blood sugar (glucose) after you have not eaten for a while (fasting). You may have this done every 1-3 years.  Bone density scan. This is done to screen for osteoporosis. You may have this done starting at age 75.  Mammogram. This may be done every 1-2 years. Talk to your health care provider about how often you should have regular mammograms.  Talk with your health care provider about your test results, treatment options, and if necessary, the need for more tests. Vaccines  Your health care provider may recommend certain vaccines, such as:  Influenza vaccine. This is recommended every year.  Tetanus, diphtheria, and  acellular pertussis (Tdap, Td) vaccine. You may need a Td booster every 10 years.  Zoster vaccine. You may need this after age 73.  Pneumococcal 13-valent conjugate (PCV13) vaccine. One dose is recommended after age 100.  Pneumococcal polysaccharide (PPSV23) vaccine. One dose is recommended after age 66. Talk to your health care provider about which screenings and vaccines you need and how often you need them. This information is not intended to replace advice given to you by your health care provider. Make sure you discuss any questions you have with your health care provider. Document Released: 02/07/2015 Document Revised: 10/01/2015 Document Reviewed: 11/12/2014 Elsevier Interactive Patient Education  2017 [place] Prevention in the Home Falls can cause injuries. They can happen to people of all ages. There are many things you can do to make your home safe and to help prevent falls. What can I do on the outside of my home?  Regularly fix the edges of walkways and driveways and fix any cracks.  Remove anything that might make you trip as you walk through a door, such as a raised step or threshold.  Trim any bushes or trees on the path to your home.  Use bright outdoor lighting.  Clear any walking paths of anything that might make someone trip, such as rocks or tools.  Regularly check to see if handrails are loose or broken. Make sure that both sides of any steps have handrails.  Any raised decks and porches should have guardrails on the edges.  Have any leaves, snow, or ice cleared regularly.  Use sand or salt on walking paths during winter.  Clean up any spills in your garage right away. This includes oil or grease spills. What can I do in the bathroom?  Use night lights.  Install grab bars by the toilet and in the tub and shower. Do not use towel bars as grab bars.  Use non-skid mats or decals in the tub or shower.  If you need to sit down in the shower, use  a plastic, non-slip stool.  Keep the floor dry. Clean up any water that spills on the floor as soon as it happens.  Remove soap buildup in the tub or shower regularly.  Attach bath mats securely with double-sided non-slip rug tape.  Do not have throw rugs and other things on the floor that can make you trip. What can I do in the bedroom?  Use night lights.  Make sure that you have a light by your bed that is easy to reach.  Do not use any sheets or blankets that are too big for your bed. They should not hang down onto the floor.  Have a firm chair that has side arms. You can use this for support while you get dressed.  Do not have throw rugs and other things on the floor that can make you trip. What can I do in the kitchen?  Clean up any spills right away.  Avoid walking on wet floors.  Keep items that you use a lot in easy-to-reach places.  If you need to reach something above you, use a strong step stool that has a grab bar.  Keep electrical cords out of the way.  Do not use floor polish or wax that makes floors slippery. If you must use wax, use non-skid floor wax.  Do not have throw rugs and other things on the floor that can make you trip. What can I do with my stairs?  Do not leave any items on the stairs.  Make sure that there are handrails on both sides of the stairs and use them. Fix handrails that are broken or loose. Make sure that handrails are as long as the stairways.  Check any carpeting to make sure that it is firmly attached to the stairs. Fix any carpet that is loose or worn.  Avoid having throw rugs at the top or bottom of the stairs. If you do have throw rugs, attach them to the floor with carpet tape.  Make sure that you have a light switch at the top of the stairs and the bottom of the stairs. If you do not have them, ask someone to add them for you. What else can I do to help prevent falls?  Wear shoes that:  Do not have high heels.  Have  rubber bottoms.  Are comfortable and fit you well.  Are closed at the toe. Do not wear sandals.  If you use a stepladder:  Make sure that it is fully opened. Do not climb a closed stepladder.  Make sure that both sides of the stepladder are locked into place.  Ask someone to hold it for you, if possible.  Clearly mark and make sure that you can see:  Any grab bars or handrails.  First and last steps.  Where the edge of each step is.  Use tools that help you move around (mobility aids) if they are needed. These include:  Canes.  Walkers.  Scooters.  Crutches.  Turn on the lights when you go into a dark area. Replace any light bulbs as soon as they burn out.  Set up your furniture so you have a clear path. Avoid moving your furniture around.  If any of your floors are uneven, fix them.  If there are any pets around you, be aware of where they are.  Review your medicines with your doctor. Some medicines can make you feel dizzy. This can increase your chance of falling. Ask your doctor what other things that you can do to help prevent falls. This information is not intended to replace advice given to you by your health care provider. Make sure you discuss any questions you have with your health care provider. Document Released: 11/07/2008 Document Revised: 06/19/2015 Document Reviewed: 02/15/2014 Elsevier Interactive Patient Education  2017 [employer].

## 2019-03-05 ENCOUNTER — Other Ambulatory Visit: Payer: Self-pay | Admitting: Family Medicine

## 2019-03-05 MED ORDER — SIMVASTATIN 20 MG PO TABS: 20.0000 mg | ORAL_TABLET | Freq: Every day | ORAL | 1 refills | Status: DC

## 2019-03-05 NOTE — Telephone Encounter (Signed)
[facility] faxed refill request for the following medications:  simvastatin (ZOCOR) 20 MG tablet   Please advise. Thanks, TGH  

## 2019-03-08 ENCOUNTER — Other Ambulatory Visit: Payer: Self-pay

## 2019-03-08 ENCOUNTER — Ambulatory Visit (INDEPENDENT_AMBULATORY_CARE_PROVIDER_SITE_OTHER): Payer: Medicare Other | Admitting: Family Medicine

## 2019-03-08 ENCOUNTER — Encounter: Payer: Self-pay | Admitting: Family Medicine

## 2019-03-08 VITALS — BP 121/83 | HR 72 | Temp 96.6°F | Wt 102.0 lb

## 2019-03-08 DIAGNOSIS — F3341 Major depressive disorder, recurrent, in partial remission: Secondary | ICD-10-CM | POA: Diagnosis not present

## 2019-03-08 DIAGNOSIS — G4700 Insomnia, unspecified: Secondary | ICD-10-CM

## 2019-03-08 DIAGNOSIS — F411 Generalized anxiety disorder: Secondary | ICD-10-CM | POA: Diagnosis not present

## 2019-03-08 DIAGNOSIS — Z720 Tobacco use: Secondary | ICD-10-CM

## 2019-03-08 DIAGNOSIS — I10 Essential (primary) hypertension: Secondary | ICD-10-CM

## 2019-03-08 DIAGNOSIS — M810 Age-related osteoporosis without current pathological fracture: Secondary | ICD-10-CM

## 2019-03-08 DIAGNOSIS — E782 Mixed hyperlipidemia: Secondary | ICD-10-CM

## 2019-03-08 MED ORDER — ALPRAZOLAM 0.5 MG PO TABS: ORAL_TABLET | ORAL | 5 refills | Status: DC

## 2019-03-08 MED ORDER — LISINOPRIL 40 MG PO TABS: 40.0000 mg | ORAL_TABLET | Freq: Every day | ORAL | 3 refills | Status: DC

## 2019-03-08 MED ORDER — SIMVASTATIN 20 MG PO TABS: 20.0000 mg | ORAL_TABLET | Freq: Every day | ORAL | 3 refills | Status: DC

## 2019-03-08 NOTE — Patient Instructions (Signed)
Preventive Care 38 Years and Older, Female Preventive care refers to lifestyle choices and visits with your health care provider that can promote health and wellness. This includes:  A yearly physical exam. This is also called an annual well check.  Regular dental and eye exams.  Immunizations.  Screening for certain conditions.  Healthy lifestyle choices, such as diet and exercise. What can I expect for my preventive care visit? Physical exam Your health care provider will check:  Height and weight. These may be used to calculate body mass index (BMI), which is a measurement that tells if you are at a healthy weight.  Heart rate and blood pressure.  Your skin for abnormal spots. Counseling Your health care provider may ask you questions about:  Alcohol, tobacco, and drug use.  Emotional well-being.  Home and relationship well-being.  Sexual activity.  Eating habits.  History of falls.  Memory and ability to understand (cognition).  Work and work Statistician.  Pregnancy and menstrual history. What immunizations do I need?  Influenza (flu) vaccine  This is recommended every year. Tetanus, diphtheria, and pertussis (Tdap) vaccine  You may need a Td booster every 10 years. Varicella (chickenpox) vaccine  You may need this vaccine if you have not already been vaccinated. Zoster (shingles) vaccine  You may need this after age 33. Pneumococcal conjugate (PCV13) vaccine  One dose is recommended after age 33. Pneumococcal polysaccharide (PPSV23) vaccine  One dose is recommended after age 72. Measles, mumps, and rubella (MMR) vaccine  You may need at least one dose of MMR if you were born in 1957 or later. You may also need a second dose. Meningococcal conjugate (MenACWY) vaccine  You may need this if you have certain conditions. Hepatitis A vaccine  You may need this if you have certain conditions or if you travel or work in places where you may be exposed  to hepatitis A. Hepatitis B vaccine  You may need this if you have certain conditions or if you travel or work in places where you may be exposed to hepatitis B. Haemophilus influenzae type b (Hib) vaccine  You may need this if you have certain conditions. You may receive vaccines as individual doses or as more than one vaccine together in one shot (combination vaccines). Talk with your health care provider about the risks and benefits of combination vaccines. What tests do I need? Blood tests  Lipid and cholesterol levels. These may be checked every 5 years, or more frequently depending on your overall health.  Hepatitis C test.  Hepatitis B test. Screening  Lung cancer screening. You may have this screening every year starting at age 39 if you have a 30-pack-year history of smoking and currently smoke or have quit within the past 15 years.  Colorectal cancer screening. All adults should have this screening starting at age 36 and continuing until age 15. Your health care provider may recommend screening at age 23 if you are at increased risk. You will have tests every 1-10 years, depending on your results and the type of screening test.  Diabetes screening. This is done by checking your blood sugar (glucose) after you have not eaten for a while (fasting). You may have this done every 1-3 years.  Mammogram. This may be done every 1-2 years. Talk with your health care provider about how often you should have regular mammograms.  BRCA-related cancer screening. This may be done if you have a family history of breast, ovarian, tubal, or peritoneal cancers.  Other tests  Sexually transmitted disease (STD) testing.  Bone density scan. This is done to screen for osteoporosis. You may have this done starting at age 44. Follow these instructions at home: Eating and drinking  Eat a diet that includes fresh fruits and vegetables, whole grains, lean protein, and low-fat dairy products. Limit  your intake of foods with high amounts of sugar, saturated fats, and salt.  Take vitamin and mineral supplements as recommended by your health care provider.  Do not drink alcohol if your health care provider tells you not to drink.  If you drink alcohol: ? Limit how much you have to 0-1 drink a day. ? Be aware of how much alcohol is in your drink. In the [place] one drink equals one 12 oz bottle of beer (355 mL), one 5 oz glass of wine (148 mL), or one 1 oz glass of hard liquor (44 mL). Lifestyle  Take daily care of your teeth and gums.  Stay active. Exercise for at least 30 minutes on 5 or more days each week.  Do not use any products that contain nicotine or tobacco, such as cigarettes, e-cigarettes, and chewing tobacco. If you need help quitting, ask your health care provider.  If you are sexually active, practice safe sex. Use a condom or other form of protection in order to prevent STIs (sexually transmitted infections).  Talk with your health care provider about taking a low-dose aspirin or statin. What's next?  Go to your health care provider once a year for a well check visit.  Ask your health care provider how often you should have your eyes and teeth checked.  Stay up to date on all vaccines. This information is not intended to replace advice given to you by your health care provider. Make sure you discuss any questions you have with your health care provider. Document Revised: 01/05/2018 Document Reviewed: 01/05/2018 Elsevier Patient Education  2020 [employer].

## 2019-03-08 NOTE — Assessment & Plan Note (Signed)
Well controlled Continue current medications Recheck metabolic panel F/u in 6 months  

## 2019-03-08 NOTE — Progress Notes (Signed)
Patient: Amanda Tucker Female    DOB: 10-17-1945   74 y.o.   MRN: [id number] Visit Date: 03/09/2019  Today's Provider: [name], MD   Chief Complaint  Patient presents with  . Hypertension  . Hyperlipidemia   Subjective:     HPI    Hypertension, follow-up:  BP Readings from Last 3 Encounters:  03/08/19 121/83  02/01/19 122/74  09/07/18 126/73    She was last seen for hypertension 7 months ago.  BP at that visit was 126/80. Management since that visit includes no changes. She reports excellent compliance with treatment. She is not having side effects.  She is exercising. She is adherent to low salt diet.   Outside blood pressures are not being checked. She is experiencing none.  Patient denies chest pain, fatigue, lower extremity edema and palpitations.   Cardiovascular risk factors include advanced age (older than 13 for men, 35 for women), dyslipidemia and hypertension.  Use of agents associated with hypertension: none.     Weight trend: stable Wt Readings from Last 3 Encounters:  03/08/19 102 lb (46.3 kg)  02/01/19 93 lb (42.2 kg)  08/23/18 95 lb (43.1 kg)    Current diet: in general, a "healthy" diet    ------------------------------------------------------------------------  Depression is well controlled, but anxiety is high in the setting of the pandemic.  She also has a friend that is potentially dying in [place].  This is causing her to smoke occasionally  She had quit previously.  Feels like medications are helping, though.  GAD 7 : Generalized Anxiety Score 03/08/2019 03/09/2018 06/06/2017 03/07/2017  Nervous, Anxious, on Edge 2 3 1  0  Control/stop worrying 2 2 1  0  Worry too much - different things 3 3 1  0  Trouble relaxing 2 3 1 1   Restless 1 0 0 0  Easily annoyed or irritable 0 0 0 0  Afraid - awful might happen 1 3 0 1  Total GAD 7 Score 11 14 4 2   Anxiety Difficulty Not difficult at all Somewhat difficult Not difficult at all Not  difficult at all     Depression screen [facility] 2/9 03/08/2019 03/01/2019 03/01/2019 03/09/2018 12/07/2017  Decreased Interest 0 0 0 0 0  Down, Depressed, Hopeless 0 0 0 1 1  PHQ - 2 Score 0 0 0 1 1  Altered sleeping 2 - - 3 -  Tired, decreased energy 0 - - 2 -  Change in appetite 0 - - 0 -  Feeling bad or failure about yourself  1 - - 0 -  Trouble concentrating 0 - - 0 -  Moving slowly or fidgety/restless 1 - - 0 -  Suicidal thoughts 0 - - 0 -  PHQ-9 Score 4 - - 6 -  Difficult doing work/chores Not difficult at all - - Somewhat difficult -     No Known Allergies   Current Outpatient Medications:  .  alendronate (FOSAMAX) 70 MG tablet, Take 1 tablet (70 mg total) by mouth once a week. Take with a full glass of water on an empty stomach. (Patient taking differently: Take 70 mg by mouth once a week. Take with a full glass of water on an empty stomach. TUESDAY), Disp: 4 tablet, Rfl: 11 .  ALPRAZolam (XANAX) 0.5 MG tablet, TAKE 1/2 TABLET BY MOUTH TWICE DAILY AS NEEDED FOR ANXIETY, Disp: 30 tablet, Rfl: 5 .  amLODipine (NORVASC) 5 MG tablet, Take 1 tablet (5 mg total) by mouth daily., Disp: 90 tablet,  Rfl: 3 .  aspirin EC 81 MG tablet, Take 81 mg daily by mouth., Disp: , Rfl:  .  aspirin-acetaminophen-caffeine (EXCEDRIN MIGRAINE) 250-250-65 MG tablet, Take 2 tablets by mouth as needed for headache., Disp: , Rfl:  .  ibuprofen (ADVIL) 200 MG tablet, Take 200 mg by mouth every 6 (six) hours as needed., Disp: , Rfl:  .  lisinopril (ZESTRIL) 40 MG tablet, Take 1 tablet (40 mg total) by mouth daily., Disp: 90 tablet, Rfl: 3 .  Multiple Vitamin (MULTIVITAMIN) tablet, Take 1 tablet by mouth daily., Disp: , Rfl:  .  sertraline (ZOLOFT) 100 MG tablet, Take 1 tablet (100 mg total) by mouth daily., Disp: 90 tablet, Rfl: 3 .  simvastatin (ZOCOR) 20 MG tablet, Take 1 tablet (20 mg total) by mouth daily., Disp: 90 tablet, Rfl: 3 .  VITAMIN D PO, Take by mouth every morning., Disp: , Rfl:   Review of Systems    Constitutional: Negative.   Respiratory: Negative.   Cardiovascular: Negative.   Gastrointestinal: Negative.   Musculoskeletal: Positive for arthralgias and joint swelling. Negative for back pain, gait problem, myalgias, neck pain and neck stiffness.  Neurological: Positive for headaches (Occasionally; better since cataractic surgery). Negative for dizziness and light-headedness.    Social History   Tobacco Use  . Smoking status: Current Some Day Smoker    Packs/day: 0.25    Years: 54.00    Pack years: 13.50    Types: Cigarettes    Last attempt to quit: 02/08/2018    Years since quitting: 1.0  . Smokeless tobacco: Never Used  . Tobacco comment: smokes 3 cigarettes out of a pack then throws away  Substance Use Topics  . Alcohol use: Yes    Alcohol/week: 5.0 standard drinks    Types: 5 Glasses of wine per week      Objective:   BP 121/83 (BP Location: Left Arm, Patient Position: Sitting, Cuff Size: Normal)   Pulse 72   Temp (!) 96.6 F (35.9 C) (Temporal)   Wt 102 lb (46.3 kg)   BMI 22.07 kg/m  Vitals:   03/08/19 1345  BP: 121/83  Pulse: 72  Temp: (!) 96.6 F (35.9 C)  TempSrc: Temporal  Weight: 102 lb (46.3 kg)  Body mass index is 22.07 kg/m.   Physical Exam Vitals reviewed.  Constitutional:      General: She is not in acute distress.    Appearance: Normal appearance. She is well-developed. She is not diaphoretic.  HENT:     Head: Normocephalic and atraumatic.  Eyes:     General: No scleral icterus.    Conjunctiva/sclera: Conjunctivae normal.  Neck:     Thyroid: No thyromegaly.  Cardiovascular:     Rate and Rhythm: Normal rate and regular rhythm.     Pulses: Normal pulses.     Heart sounds: Normal heart sounds. No murmur.  Pulmonary:     Effort: Pulmonary effort is normal. No respiratory distress.     Breath sounds: Normal breath sounds. No wheezing, rhonchi or rales.  Musculoskeletal:     Cervical back: Neck supple.     Right lower leg: No edema.      Left lower leg: No edema.  Lymphadenopathy:     Cervical: No cervical adenopathy.  Skin:    General: Skin is warm and dry.     Findings: No rash.  Neurological:     Mental Status: She is alert and oriented to person, place, and time. Mental status is at baseline.  Psychiatric:        Mood and Affect: Mood normal.        Behavior: Behavior normal.      No results found for any visits on 03/08/19.     Assessment & Plan    Problem List Items Addressed This Visit      Cardiovascular and Mediastinum   Hypertension - Primary    Well controlled Continue current medications Recheck metabolic panel F/u in 6 months       Relevant Medications   simvastatin (ZOCOR) 20 MG tablet   lisinopril (ZESTRIL) 40 MG tablet     Musculoskeletal and Integument   Age-related osteoporosis without current pathological fracture    Continue weekly fosamax Tolerating well Repeat DEXA in 2022 Plan for fosamax drug holiday at 5 yrs        Other   Hyperlipidemia    Continue simvastatin Recheck FLP and CMP      Relevant Medications   simvastatin (ZOCOR) 20 MG tablet   lisinopril (ZESTRIL) 40 MG tablet   Other Relevant Orders   Lipid panel   Comprehensive metabolic panel   MDD (major depressive disorder)    Fairly well-controlled Continue Zoloft 100 mg daily Encourage therapy      Relevant Medications   ALPRAZolam (XANAX) 0.5 MG tablet   Generalized anxiety disorder    Chronic and stable Fairly well-controlled Continue Zoloft daily and Xanax sparingly We have discussed the risks of benzo therapy in the elderly      Relevant Medications   ALPRAZolam (XANAX) 0.5 MG tablet   Tobacco abuse    Encouraged her cessation efforts      Insomnia    Fairly well controlled Continue low dose Xanax use cautiously as above          Return in about 6 months (around 09/05/2019) for chronic disease f/u.   The entirety of the information documented in the History of Present  Illness, Review of Systems and Physical Exam were personally obtained by me. Portions of this information were initially documented by Ashley Royalty, CMA and reviewed by me for thoroughness and accuracy.    Emery Dupuy, [name], MD MPH [facility]

## 2019-03-08 NOTE — Assessment & Plan Note (Signed)
Continue weekly fosamax Tolerating well Repeat DEXA in 2022 Plan for fosamax drug holiday at 4 yrs

## 2019-03-09 ENCOUNTER — Ambulatory Visit: Payer: Medicare Other | Attending: Internal Medicine

## 2019-03-09 DIAGNOSIS — Z23 Encounter for immunization: Secondary | ICD-10-CM | POA: Insufficient documentation

## 2019-03-09 NOTE — Assessment & Plan Note (Signed)
Fairly well-controlled Continue Zoloft 100 mg daily Encourage therapy

## 2019-03-09 NOTE — Assessment & Plan Note (Signed)
Encouraged her cessation efforts

## 2019-03-09 NOTE — Assessment & Plan Note (Signed)
Continue simvastatin Recheck FLP and CMP

## 2019-03-09 NOTE — Assessment & Plan Note (Signed)
Chronic and stable Fairly well-controlled Continue Zoloft daily and Xanax sparingly We have discussed the risks of benzo therapy in the elderly

## 2019-03-09 NOTE — Progress Notes (Signed)
   Covid-19 Vaccination Clinic  Name:  Amanda Tucker    MRN: [id number] DOB: February 08, 1945  03/09/2019  Amanda Tucker was observed post Covid-19 immunization for 15 minutes without incidence. She was provided with Vaccine Information Sheet and instruction to access the V-Safe system.   Amanda Tucker was instructed to call 911 with any severe reactions post vaccine: Marland Kitchen Difficulty breathing  . Swelling of your face and throat  . A fast heartbeat  . A bad rash all over your body  . Dizziness and weakness    Immunizations Administered    Name Date Dose VIS Date Route   Pfizer COVID-19 Vaccine 03/09/2019  9:30 AM 0.3 mL 01/05/2019 Intramuscular   Manufacturer: [place]   Lot: [id number]   [place]: [id number]

## 2019-03-09 NOTE — Assessment & Plan Note (Signed)
Fairly well controlled Continue low-dose Xanax use cautiously as above 

## 2019-03-14 DIAGNOSIS — E782 Mixed hyperlipidemia: Secondary | ICD-10-CM | POA: Diagnosis not present

## 2019-03-15 LAB — COMPREHENSIVE METABOLIC PANEL
ALT: 23 IU/L (ref 0–32)
AST: 25 IU/L (ref 0–40)
Albumin/Globulin Ratio: 1.7 (ref 1.2–2.2)
Albumin: 4.3 g/dL (ref 3.7–4.7)
Alkaline Phosphatase: 86 IU/L (ref 39–117)
BUN/Creatinine Ratio: 13 (ref 12–28)
BUN: 8 mg/dL (ref 8–27)
Bilirubin Total: 0.4 mg/dL (ref 0.0–1.2)
CO2: 25 mmol/L (ref 20–29)
Calcium: 8.9 mg/dL (ref 8.7–10.3)
Chloride: 103 mmol/L (ref 96–106)
Creatinine, Ser: 0.6 mg/dL (ref 0.57–1.00)
GFR calc Af Amer: 104 mL/min/{1.73_m2} (ref >59)
GFR calc non Af Amer: 90 mL/min/{1.73_m2} (ref >59)
Globulin, Total: 2.5 g/dL (ref 1.5–4.5)
Glucose: 78 mg/dL (ref 65–99)
Potassium: 4.1 mmol/L (ref 3.5–5.2)
Sodium: 142 mmol/L (ref 134–144)
Total Protein: 6.8 g/dL (ref 6.0–8.5)

## 2019-03-15 LAB — LIPID PANEL
Chol/HDL Ratio: 3.1 ratio (ref 0.0–4.4)
Cholesterol, Total: 214 mg/dL — ABNORMAL HIGH (ref 100–199)
HDL: 69 mg/dL (ref >39)
LDL Chol Calc (NIH): 122 mg/dL — ABNORMAL HIGH (ref 0–99)
Triglycerides: 130 mg/dL (ref 0–149)
VLDL Cholesterol Cal: 23 mg/dL (ref 5–40)

## 2019-03-16 ENCOUNTER — Other Ambulatory Visit: Payer: Self-pay

## 2019-03-16 MED ORDER — SIMVASTATIN 40 MG PO TABS: 40.0000 mg | ORAL_TABLET | Freq: Every day | ORAL | 3 refills | Status: DC

## 2019-03-16 MED ORDER — ALENDRONATE SODIUM 70 MG PO TABS: 70.0000 mg | ORAL_TABLET | ORAL | 11 refills | Status: DC

## 2019-03-16 NOTE — Telephone Encounter (Signed)
-----   Message from Virginia Crews, MD sent at 03/15/2019  3:33 PM EST ----- Normal labs, except cholesterol has increased significantly since last checked.  Is she still taking Simvastatin regularly?  If so, I'd recommend increasing the dose.  If not, needs to resume.  Also recommend diet low in saturated fat and regular exercise - 30 min at least 5 times per week.

## 2019-03-16 NOTE — Telephone Encounter (Signed)
Patient advised as below. Patient verbalizes understanding and is in agreement with treatment plan.  

## 2019-04-03 ENCOUNTER — Ambulatory Visit: Payer: Medicare Other | Attending: Internal Medicine

## 2019-04-03 DIAGNOSIS — Z23 Encounter for immunization: Secondary | ICD-10-CM | POA: Insufficient documentation

## 2019-04-03 NOTE — Progress Notes (Signed)
   Covid-19 Vaccination Clinic  Name:  Amanda Tucker    MRN: [id number] DOB: [birth year]  04/03/2019  [name] was observed post Covid-19 immunization for 15 minutes without incident. She was provided with Vaccine Information Sheet and instruction to access the V-Safe system.   [name] was instructed to call 911 with any severe reactions post vaccine: Marland Kitchen Difficulty breathing  . Swelling of face and throat  . A fast heartbeat  . A bad rash all over body  . Dizziness and weakness   Immunizations Administered    Name Date Dose VIS Date Route   Pfizer COVID-19 Vaccine 04/03/2019 11:02 AM 0.3 mL 01/05/2019 Intramuscular   Manufacturer: [place]   Lot: [id number]   [place]: [id number]

## 2019-07-27 ENCOUNTER — Other Ambulatory Visit: Payer: Self-pay | Admitting: Family Medicine

## 2019-07-27 MED ORDER — AMLODIPINE BESYLATE 5 MG PO TABS: 5.0000 mg | ORAL_TABLET | Freq: Every day | ORAL | 1 refills | Status: DC

## 2019-07-27 NOTE — Telephone Encounter (Signed)
[facility] faxed refill request for the following medications:   amLODipine (NORVASC) 5 MG tablet   Please advise.  

## 2019-09-05 ENCOUNTER — Ambulatory Visit: Payer: Medicare Other | Admitting: Family Medicine

## 2019-09-20 ENCOUNTER — Other Ambulatory Visit: Payer: Self-pay | Admitting: Family Medicine

## 2019-09-20 NOTE — Telephone Encounter (Signed)
Please review for refill. Refill not delegated per protocol.  Last refill: 03/08/19 #30 with 5 refills

## 2019-09-20 NOTE — Telephone Encounter (Signed)
ALPRAZolam (XANAX) 0.5 MG tablet  [id number] Amanda Tucker, [address] AT [place] Phone:  [phone]  Fax:  [phone]     Pt has made an appt for 9/23 and was wanting a refill as she is out

## 2019-09-21 MED ORDER — ALPRAZOLAM 0.5 MG PO TABS: ORAL_TABLET | ORAL | 0 refills | Status: DC

## 2019-09-21 NOTE — Telephone Encounter (Signed)
Patient would like medication request expedited and would like a call once medication has been sen to pharmacy.

## 2019-09-21 NOTE — Telephone Encounter (Signed)
Requested medication (s) are due for refill today: yes   Requested medication (s) are on the active medication list: yes  Last refill:  03/08/2019  Future visit scheduled: yes   Notes to clinic:  this refill cannot be delegated  Patient would like a call once this has been completed    Requested Prescriptions  Pending Prescriptions Disp Refills   ALPRAZolam (XANAX) 0.5 MG tablet 30 tablet 5    Sig: TAKE 1/2 TABLET BY MOUTH TWICE DAILY AS NEEDED FOR ANXIETY      Not Delegated - Psychiatry:  Anxiolytics/Hypnotics Failed - 09/21/2019  9:09 AM      Failed - This refill cannot be delegated      Failed - Urine Drug Screen completed in last 360 days.      Failed - Valid encounter within last 6 months    Recent Outpatient Visits           6 months ago Essential hypertension   [facility] [place], [name], MD   1 year ago Acute non-recurrent frontal sinusitis   [facility] [place], [place]   1 year ago Fever, unspecified fever cause   [facility] [place], [name], MD   1 year ago Essential hypertension   [facility] [place], [name], MD   1 year ago Essential hypertension   [place], [name], MD       Future Appointments             In 3 weeks [place], [name], MD [facility], [place]

## 2019-10-18 ENCOUNTER — Encounter: Payer: Self-pay | Admitting: Family Medicine

## 2019-10-18 ENCOUNTER — Ambulatory Visit (INDEPENDENT_AMBULATORY_CARE_PROVIDER_SITE_OTHER): Payer: Medicare Other | Admitting: Family Medicine

## 2019-10-18 ENCOUNTER — Other Ambulatory Visit: Payer: Self-pay

## 2019-10-18 VITALS — BP 111/78 | HR 73 | Temp 96.1°F | Wt 96.0 lb

## 2019-10-18 DIAGNOSIS — F3341 Major depressive disorder, recurrent, in partial remission: Secondary | ICD-10-CM

## 2019-10-18 DIAGNOSIS — I10 Essential (primary) hypertension: Secondary | ICD-10-CM | POA: Diagnosis not present

## 2019-10-18 DIAGNOSIS — Z720 Tobacco use: Secondary | ICD-10-CM | POA: Diagnosis not present

## 2019-10-18 DIAGNOSIS — F411 Generalized anxiety disorder: Secondary | ICD-10-CM | POA: Diagnosis not present

## 2019-10-18 DIAGNOSIS — Z23 Encounter for immunization: Secondary | ICD-10-CM

## 2019-10-18 DIAGNOSIS — G4700 Insomnia, unspecified: Secondary | ICD-10-CM

## 2019-10-18 DIAGNOSIS — E782 Mixed hyperlipidemia: Secondary | ICD-10-CM

## 2019-10-18 MED ORDER — ALPRAZOLAM 0.5 MG PO TABS: ORAL_TABLET | ORAL | 5 refills | Status: DC

## 2019-10-18 NOTE — Progress Notes (Signed)
Established patient visit   Patient: Amanda Tucker   DOB: 06-15-1945   74 y.o. Female  MRN: [id number] Visit Date: 10/18/2019  Today's healthcare provider: [name], MD   Chief Complaint  Patient presents with  . Hyperlipidemia  . Hypertension   Subjective    HPI  Hypertension, follow-up  BP Readings from Last 3 Encounters:  10/18/19 111/78  03/08/19 121/83  02/01/19 122/74   Wt Readings from Last 3 Encounters:  10/18/19 96 lb (43.5 kg)  03/08/19 102 lb (46.3 kg)  02/01/19 93 lb (42.2 kg)     She was last seen for hypertension 6 months ago.    She reports excellent compliance with treatment. She is not having side effects.  She is following a Regular diet. She is exercising. She does smoke.  Use of agents associated with hypertension: none.   Outside blood pressures are not being checked. Symptoms: No chest pain No chest pressure  No palpitations No syncope  No dyspnea No orthopnea  No paroxysmal nocturnal dyspnea No lower extremity edema   Pertinent labs: Lab Results  Component Value Date   CHOL 164 10/18/2019   HDL 58 10/18/2019   LDLCALC 86 10/18/2019   TRIG 111 10/18/2019   CHOLHDL 2.8 10/18/2019   Lab Results  Component Value Date   NA 136 10/18/2019   K 3.8 10/18/2019   CREATININE 0.52 (L) 10/18/2019   GFRNONAA 94 10/18/2019   GFRAA 108 10/18/2019   GLUCOSE 79 10/18/2019     The 10-year ASCVD risk score Mikey Bussing DC Jr., et al., 2013) is: 22.6%   --------------------------------------------------------------------------------------------------- Lipid/Cholesterol, Follow-up  Last lipid panel Other pertinent labs  Lab Results  Component Value Date   CHOL 164 10/18/2019   HDL 58 10/18/2019   LDLCALC 86 10/18/2019   TRIG 111 10/18/2019   CHOLHDL 2.8 10/18/2019   Lab Results  Component Value Date   ALT 17 10/18/2019   AST 20 10/18/2019   PLT 209 12/14/2016   TSH 0.71 12/14/2016     She was last seen for this 6 months ago.     She reports excellent compliance with treatment. She is not having side effects.   Symptoms: No chest pain No chest pressure/discomfort  No dyspnea No lower extremity edema  No numbness or tingling of extremity No orthopnea  No palpitations No paroxysmal nocturnal dyspnea  No speech difficulty No syncope   Current diet: in general, a "healthy" diet     The 10-year ASCVD risk score Mikey Bussing DC Jr., et al., 2013) is: 22.6%  ---------------------------------------------------------------------------------------------------  Depression screen [facility] 2/9 03/08/2019 03/01/2019 03/01/2019 03/09/2018 12/07/2017  Decreased Interest 0 0 0 0 0  Down, Depressed, Hopeless 0 0 0 1 1  PHQ - 2 Score 0 0 0 1 1  Altered sleeping 2 - - 3 -  Tired, decreased energy 0 - - 2 -  Change in appetite 0 - - 0 -  Feeling bad or failure about yourself  1 - - 0 -  Trouble concentrating 0 - - 0 -  Moving slowly or fidgety/restless 1 - - 0 -  Suicidal thoughts 0 - - 0 -  PHQ-9 Score 4 - - 6 -  Difficult doing work/chores Not difficult at all - - Somewhat difficult -     Patient Active Problem List   Diagnosis Date Noted  . IBS (irritable bowel syndrome) 03/09/2018  . Hypokalemia 03/09/2018  . Age-related osteoporosis without current pathological fracture 03/09/2018  . Baker's  cyst of knee, right 06/06/2017  . Insomnia 03/07/2017  . Tobacco abuse 12/01/2016  . Hypertension   . Hyperlipidemia   . MDD (major depressive disorder)   . Generalized anxiety disorder    Past Medical History:  Diagnosis Date  . Anxiety   . Arthritis   . Balance problem   . Depression   . Diabetes mellitus without complication ([place])   . Diverticulitis   . Environmental and seasonal allergies   . GERD (gastroesophageal reflux disease)   . HOH (hard of hearing)   . Hyperlipidemia   . Hypertension   . IBS (irritable bowel syndrome)   . Osteoporosis   . Right sided sciatica   . Vertigo    Social History   Tobacco Use  .  Smoking status: Current Some Day Smoker    Packs/day: 0.25    Years: 54.00    Pack years: 13.50    Types: Cigarettes    Last attempt to quit: 02/08/2018    Years since quitting: 1.6  . Smokeless tobacco: Never Used  . Tobacco comment: smokes 3 cigarettes out of a pack then throws away  Vaping Use  . Vaping Use: Never used  Substance Use Topics  . Alcohol use: Yes    Alcohol/week: 5.0 standard drinks    Types: 5 Glasses of wine per week  . Drug use: Never   No Known Allergies   Medications: Outpatient Medications Prior to Visit  Medication Sig  . alendronate (FOSAMAX) 70 MG tablet Take 1 tablet (70 mg total) by mouth once a week. Take with a full glass of water on an empty stomach.  Marland Kitchen amLODipine (NORVASC) 5 MG tablet Take 1 tablet (5 mg total) by mouth daily.  Marland Kitchen aspirin EC 81 MG tablet Take 81 mg daily by mouth.  Marland Kitchen aspirin-acetaminophen-caffeine (EXCEDRIN MIGRAINE) 250-250-65 MG tablet Take 2 tablets by mouth as needed for headache.  . ibuprofen (ADVIL) 200 MG tablet Take 200 mg by mouth every 6 (six) hours as needed.  Marland Kitchen lisinopril (ZESTRIL) 40 MG tablet Take 1 tablet (40 mg total) by mouth daily.  . Multiple Vitamin (MULTIVITAMIN) tablet Take 1 tablet by mouth daily.  . sertraline (ZOLOFT) 100 MG tablet Take 1 tablet (100 mg total) by mouth daily.  . simvastatin (ZOCOR) 40 MG tablet Take 1 tablet (40 mg total) by mouth daily.  Marland Kitchen VITAMIN D PO Take by mouth every morning.  . [DISCONTINUED] ALPRAZolam (XANAX) 0.5 MG tablet TAKE 1/2 TABLET BY MOUTH TWICE DAILY AS NEEDED FOR ANXIETY   No facility-administered medications prior to visit.    Review of Systems  Constitutional: Negative.   Respiratory: Negative.   Cardiovascular: Negative.   Gastrointestinal: Positive for diarrhea (Chronic issue). Negative for abdominal distention, abdominal pain, anal bleeding, blood in stool, constipation, nausea, rectal pain and vomiting.  Neurological: Negative for dizziness, light-headedness and  headaches.  Psychiatric/Behavioral: The patient is nervous/anxious.       Objective    BP 111/78 (BP Location: Right Arm, Patient Position: Sitting, Cuff Size: Normal)   Pulse 73   Temp (!) 96.1 F (35.6 C) (Oral)   Wt 96 lb (43.5 kg)   BMI 20.77 kg/m    Physical Exam Vitals reviewed.  Constitutional:      General: She is not in acute distress.    Appearance: Normal appearance. She is well-developed. She is not diaphoretic.  HENT:     Head: Normocephalic and atraumatic.  Eyes:     General: No scleral icterus.  Conjunctiva/sclera: Conjunctivae normal.  Neck:     Thyroid: No thyromegaly.  Cardiovascular:     Rate and Rhythm: Normal rate and regular rhythm.     Pulses: Normal pulses.     Heart sounds: Normal heart sounds. No murmur heard.   Pulmonary:     Effort: Pulmonary effort is normal. No respiratory distress.     Breath sounds: Normal breath sounds. No wheezing, rhonchi or rales.  Musculoskeletal:     Cervical back: Neck supple.     Right lower leg: No edema.     Left lower leg: No edema.  Lymphadenopathy:     Cervical: No cervical adenopathy.  Skin:    General: Skin is warm and dry.     Findings: No rash.  Neurological:     Mental Status: She is alert and oriented to person, place, and time. Mental status is at baseline.  Psychiatric:        Mood and Affect: Mood normal.        Behavior: Behavior normal.      Results for orders placed or performed in visit on 10/18/19  Comprehensive metabolic panel  Result Value Ref Range   Glucose 79 65 - 99 mg/dL   BUN 5 (L) 8 - 27 mg/dL   Creatinine, Ser 0.52 (L) 0.57 - 1.00 mg/dL   GFR calc non Af Amer 94 >59 mL/min/1.73   GFR calc Af Amer 108 >59 mL/min/1.73   BUN/Creatinine Ratio 10 (L) 12 - 28   Sodium 136 134 - 144 mmol/L   Potassium 3.8 3.5 - 5.2 mmol/L   Chloride 100 96 - 106 mmol/L   CO2 22 20 - 29 mmol/L   Calcium 9.0 8.7 - 10.3 mg/dL   Total Protein 6.3 6.0 - 8.5 g/dL   Albumin 4.0 3.7 - 4.7 g/dL     Globulin, Total 2.3 1.5 - 4.5 g/dL   Albumin/Globulin Ratio 1.7 1.2 - 2.2   Bilirubin Total 0.3 0.0 - 1.2 mg/dL   Alkaline Phosphatase 61 44 - 121 IU/L   AST 20 0 - 40 IU/L   ALT 17 0 - 32 IU/L  Lipid panel  Result Value Ref Range   Cholesterol, Total 164 100 - 199 mg/dL   Triglycerides 111 0 - 149 mg/dL   HDL 58 >39 mg/dL   VLDL Cholesterol Cal 20 5 - 40 mg/dL   LDL Chol Calc (NIH) 86 0 - 99 mg/dL   Chol/HDL Ratio 2.8 0.0 - 4.4 ratio    Assessment & Plan     Problem List Items Addressed This Visit      Cardiovascular and Mediastinum   Hypertension - Primary    Well controlled Continue current medications Recheck metabolic panel F/u in 6 months        Relevant Orders   Comprehensive metabolic panel (Completed)     Other   Hyperlipidemia    Previously well controlled Continue statin Repeat FLP and CMP Goal LDL < 100      Relevant Orders   Comprehensive metabolic panel (Completed)   Lipid panel (Completed)   MDD (major depressive disorder)    Well-controlled Continue Zoloft at current dose      Relevant Medications   ALPRAZolam (XANAX) 0.5 MG tablet   Generalized anxiety disorder    Chronic and stable Some exacerbation recently related to world and family events Continue zoloft and Xanax sparingly We have discussed the risks of chronic benzo therapy in the elderly      Relevant  Medications   ALPRAZolam (XANAX) 0.5 MG tablet   Tobacco abuse    Continue to encourage her cessation efforts      Insomnia    Fairly well controlled Continue low-dose Xanax use cautiously as above          Return in about 6 months (around 04/16/2020) for AWV, chronic disease f/u.      I, [name], MD, have reviewed all documentation for this visit. The documentation on 10/19/19 for the exam, diagnosis, procedures, and orders are all accurate and complete.   Natausha Jungwirth, [name], MD, MPH [facility]

## 2019-10-18 NOTE — Assessment & Plan Note (Signed)
Well-controlled Continue Zoloft at current dose

## 2019-10-18 NOTE — Assessment & Plan Note (Signed)
Continue to encourage her cessation efforts

## 2019-10-18 NOTE — Assessment & Plan Note (Signed)
Previously well controlled Continue statin Repeat FLP and CMP Goal LDL < 100

## 2019-10-18 NOTE — Assessment & Plan Note (Signed)
Well controlled Continue current medications Recheck metabolic panel F/u in 6 months  

## 2019-10-18 NOTE — Assessment & Plan Note (Signed)
Fairly well controlled Continue low-dose Xanax use cautiously as above

## 2019-10-18 NOTE — Assessment & Plan Note (Signed)
Chronic and stable Some exacerbation recently related to world and family events Continue zoloft and Xanax sparingly We have discussed the risks of chronic benzo therapy in the elderly

## 2019-10-19 ENCOUNTER — Telehealth: Payer: Self-pay

## 2019-10-19 LAB — COMPREHENSIVE METABOLIC PANEL
ALT: 17 IU/L (ref 0–32)
AST: 20 IU/L (ref 0–40)
Albumin/Globulin Ratio: 1.7 (ref 1.2–2.2)
Albumin: 4 g/dL (ref 3.7–4.7)
Alkaline Phosphatase: 61 IU/L (ref 44–121)
BUN/Creatinine Ratio: 10 — ABNORMAL LOW (ref 12–28)
BUN: 5 mg/dL — ABNORMAL LOW (ref 8–27)
Bilirubin Total: 0.3 mg/dL (ref 0.0–1.2)
CO2: 22 mmol/L (ref 20–29)
Calcium: 9 mg/dL (ref 8.7–10.3)
Chloride: 100 mmol/L (ref 96–106)
Creatinine, Ser: 0.52 mg/dL — ABNORMAL LOW (ref 0.57–1.00)
GFR calc Af Amer: 108 mL/min/{1.73_m2} (ref >59)
GFR calc non Af Amer: 94 mL/min/{1.73_m2} (ref >59)
Globulin, Total: 2.3 g/dL (ref 1.5–4.5)
Glucose: 79 mg/dL (ref 65–99)
Potassium: 3.8 mmol/L (ref 3.5–5.2)
Sodium: 136 mmol/L (ref 134–144)
Total Protein: 6.3 g/dL (ref 6.0–8.5)

## 2019-10-19 LAB — LIPID PANEL
Chol/HDL Ratio: 2.8 ratio (ref 0.0–4.4)
Cholesterol, Total: 164 mg/dL (ref 100–199)
HDL: 58 mg/dL (ref >39)
LDL Chol Calc (NIH): 86 mg/dL (ref 0–99)
Triglycerides: 111 mg/dL (ref 0–149)
VLDL Cholesterol Cal: 20 mg/dL (ref 5–40)

## 2019-10-19 NOTE — Telephone Encounter (Signed)
-----   Message from Virginia Crews, MD sent at 10/19/2019  9:38 AM EDT ----- Normal labs

## 2019-10-19 NOTE — Telephone Encounter (Signed)
Pt advised.   Thanks,   -Dontrel Smethers  

## 2019-10-19 NOTE — Addendum Note (Signed)
Addended by: Ashley Royalty E on: 10/19/2019 02:24 PM   Modules accepted: Orders

## 2019-10-25 ENCOUNTER — Other Ambulatory Visit: Payer: Self-pay

## 2019-10-25 ENCOUNTER — Ambulatory Visit (INDEPENDENT_AMBULATORY_CARE_PROVIDER_SITE_OTHER): Payer: Medicare Other

## 2019-10-25 DIAGNOSIS — Z23 Encounter for immunization: Secondary | ICD-10-CM | POA: Diagnosis not present

## 2019-12-21 ENCOUNTER — Encounter
Admission: EM | Disposition: A | Discharge status: Home / Self Care | Payer: Self-pay | Source: Home / Self Care | Attending: Emergency Medicine

## 2019-12-21 ENCOUNTER — Emergency Department: Payer: Medicare Other | Admitting: Anesthesiology

## 2019-12-21 ENCOUNTER — Ambulatory Visit: Payer: Self-pay | Admitting: *Deleted

## 2019-12-21 ENCOUNTER — Other Ambulatory Visit: Payer: Self-pay

## 2019-12-21 ENCOUNTER — Emergency Department: Payer: Medicare Other

## 2019-12-21 ENCOUNTER — Encounter: Payer: Self-pay | Admitting: Emergency Medicine

## 2019-12-21 ENCOUNTER — Ambulatory Visit
Admission: EM | Admit: 2019-12-21 | Discharge: 2019-12-21 | Disposition: A | Discharge status: Home / Self Care | Payer: Medicare Other | Attending: Gastroenterology | Admitting: Gastroenterology

## 2019-12-21 DIAGNOSIS — K219 Gastro-esophageal reflux disease without esophagitis: Secondary | ICD-10-CM | POA: Insufficient documentation

## 2019-12-21 DIAGNOSIS — Z820 Family history of epilepsy and other diseases of the nervous system: Secondary | ICD-10-CM | POA: Diagnosis not present

## 2019-12-21 DIAGNOSIS — J449 Chronic obstructive pulmonary disease, unspecified: Secondary | ICD-10-CM | POA: Diagnosis not present

## 2019-12-21 DIAGNOSIS — Z8349 Family history of other endocrine, nutritional and metabolic diseases: Secondary | ICD-10-CM | POA: Insufficient documentation

## 2019-12-21 DIAGNOSIS — F17210 Nicotine dependence, cigarettes, uncomplicated: Secondary | ICD-10-CM | POA: Insufficient documentation

## 2019-12-21 DIAGNOSIS — Z20822 Contact with and (suspected) exposure to covid-19: Secondary | ICD-10-CM | POA: Insufficient documentation

## 2019-12-21 DIAGNOSIS — Z8249 Family history of ischemic heart disease and other diseases of the circulatory system: Secondary | ICD-10-CM | POA: Diagnosis not present

## 2019-12-21 DIAGNOSIS — E119 Type 2 diabetes mellitus without complications: Secondary | ICD-10-CM | POA: Insufficient documentation

## 2019-12-21 DIAGNOSIS — Z7982 Long term (current) use of aspirin: Secondary | ICD-10-CM | POA: Insufficient documentation

## 2019-12-21 DIAGNOSIS — F418 Other specified anxiety disorders: Secondary | ICD-10-CM | POA: Diagnosis not present

## 2019-12-21 DIAGNOSIS — X58XXXA Exposure to other specified factors, initial encounter: Secondary | ICD-10-CM | POA: Insufficient documentation

## 2019-12-21 DIAGNOSIS — K449 Diaphragmatic hernia without obstruction or gangrene: Secondary | ICD-10-CM | POA: Diagnosis not present

## 2019-12-21 DIAGNOSIS — M5431 Sciatica, right side: Secondary | ICD-10-CM | POA: Insufficient documentation

## 2019-12-21 DIAGNOSIS — E876 Hypokalemia: Secondary | ICD-10-CM | POA: Diagnosis not present

## 2019-12-21 DIAGNOSIS — K589 Irritable bowel syndrome without diarrhea: Secondary | ICD-10-CM | POA: Diagnosis not present

## 2019-12-21 DIAGNOSIS — I700 Atherosclerosis of aorta: Secondary | ICD-10-CM | POA: Insufficient documentation

## 2019-12-21 DIAGNOSIS — Q399 Congenital malformation of esophagus, unspecified: Secondary | ICD-10-CM | POA: Diagnosis not present

## 2019-12-21 DIAGNOSIS — I10 Essential (primary) hypertension: Secondary | ICD-10-CM | POA: Insufficient documentation

## 2019-12-21 DIAGNOSIS — Z791 Long term (current) use of non-steroidal anti-inflammatories (NSAID): Secondary | ICD-10-CM | POA: Diagnosis not present

## 2019-12-21 DIAGNOSIS — Z823 Family history of stroke: Secondary | ICD-10-CM | POA: Insufficient documentation

## 2019-12-21 DIAGNOSIS — E785 Hyperlipidemia, unspecified: Secondary | ICD-10-CM | POA: Insufficient documentation

## 2019-12-21 DIAGNOSIS — Z800 Family history of malignant neoplasm of digestive organs: Secondary | ICD-10-CM | POA: Insufficient documentation

## 2019-12-21 DIAGNOSIS — Z79899 Other long term (current) drug therapy: Secondary | ICD-10-CM | POA: Insufficient documentation

## 2019-12-21 DIAGNOSIS — R1310 Dysphagia, unspecified: Secondary | ICD-10-CM | POA: Diagnosis not present

## 2019-12-21 DIAGNOSIS — T18128A Food in esophagus causing other injury, initial encounter: Secondary | ICD-10-CM | POA: Diagnosis not present

## 2019-12-21 DIAGNOSIS — M810 Age-related osteoporosis without current pathological fracture: Secondary | ICD-10-CM | POA: Insufficient documentation

## 2019-12-21 HISTORY — PX: ESOPHAGOGASTRODUODENOSCOPY (EGD) WITH PROPOFOL: SHX5813

## 2019-12-21 LAB — BASIC METABOLIC PANEL
Anion gap: 13 (ref 5–15)
BUN: 16 mg/dL (ref 8–23)
CO2: 23 mmol/L (ref 22–32)
Calcium: 9.2 mg/dL (ref 8.9–10.3)
Chloride: 103 mmol/L (ref 98–111)
Creatinine, Ser: 0.72 mg/dL (ref 0.44–1.00)
GFR, Estimated: >60 mL/min (ref >60)
Glucose, Bld: 104 mg/dL — ABNORMAL HIGH (ref 70–99)
Potassium: 4.2 mmol/L (ref 3.5–5.1)
Sodium: 139 mmol/L (ref 135–145)

## 2019-12-21 LAB — RESP PANEL BY RT-PCR (FLU A&B, COVID) ARPGX2
Influenza A by PCR: NEGATIVE
Influenza B by PCR: NEGATIVE
SARS Coronavirus 2 by RT PCR: NEGATIVE

## 2019-12-21 LAB — CBC WITH DIFFERENTIAL/PLATELET
Abs Immature Granulocytes: 0.03 10*3/uL (ref 0.00–0.07)
Basophils Absolute: 0.1 10*3/uL (ref 0.0–0.1)
Basophils Relative: 1 %
Eosinophils Absolute: 0 10*3/uL (ref 0.0–0.5)
Eosinophils Relative: 0 %
HCT: 41.8 % (ref 36.0–46.0)
Hemoglobin: 14 g/dL (ref 12.0–15.0)
Immature Granulocytes: 0 %
Lymphocytes Relative: 9 %
Lymphs Abs: 0.7 10*3/uL (ref 0.7–4.0)
MCH: 31.3 pg (ref 26.0–34.0)
MCHC: 33.5 g/dL (ref 30.0–36.0)
MCV: 93.3 fL (ref 80.0–100.0)
Monocytes Absolute: 0.4 10*3/uL (ref 0.1–1.0)
Monocytes Relative: 5 %
Neutro Abs: 6.6 10*3/uL (ref 1.7–7.7)
Neutrophils Relative %: 85 %
Platelets: 215 10*3/uL (ref 150–400)
RBC: 4.48 MIL/uL (ref 3.87–5.11)
RDW: 13.9 % (ref 11.5–15.5)
WBC: 7.8 10*3/uL (ref 4.0–10.5)
nRBC: 0 % (ref 0.0–0.2)

## 2019-12-21 SURGERY — ESOPHAGOGASTRODUODENOSCOPY (EGD) WITH PROPOFOL
Anesthesia: General

## 2019-12-21 MED ORDER — ONDANSETRON HCL 4 MG/2ML IJ SOLN
4.0000 mg | Freq: Once | INTRAMUSCULAR | Status: AC
  Administered 2019-12-21: 4 mg via INTRAVENOUS
  Filled 2019-12-21: qty 2

## 2019-12-21 MED ORDER — PHENYLEPHRINE HCL (PRESSORS) 10 MG/ML IV SOLN
INTRAVENOUS | Status: DC | PRN
  Administered 2019-12-21 (×3): 200 ug via INTRAVENOUS
  Administered 2019-12-21: 100 ug via INTRAVENOUS
  Administered 2019-12-21: 200 ug via INTRAVENOUS

## 2019-12-21 MED ORDER — MIDAZOLAM HCL 5 MG/5ML IJ SOLN
INTRAMUSCULAR | Status: DC | PRN
  Administered 2019-12-21: 1 mg via INTRAVENOUS

## 2019-12-21 MED ORDER — ONDANSETRON HCL 4 MG/2ML IJ SOLN: 4.0000 mg | Freq: Once | INTRAMUSCULAR | Status: DC

## 2019-12-21 MED ORDER — LORAZEPAM 2 MG/ML IJ SOLN: 0.5000 mg | Freq: Once | INTRAMUSCULAR | Status: DC

## 2019-12-21 MED ORDER — SUCCINYLCHOLINE CHLORIDE 200 MG/10ML IV SOSY
PREFILLED_SYRINGE | INTRAVENOUS | Status: AC
  Filled 2019-12-21: qty 10

## 2019-12-21 MED ORDER — LIDOCAINE HCL (CARDIAC) PF 100 MG/5ML IV SOSY
PREFILLED_SYRINGE | INTRAVENOUS | Status: DC | PRN
  Administered 2019-12-21: 60 mg via INTRAVENOUS

## 2019-12-21 MED ORDER — LIDOCAINE HCL (PF) 2 % IJ SOLN
INTRAMUSCULAR | Status: AC
  Filled 2019-12-21: qty 5

## 2019-12-21 MED ORDER — METOCLOPRAMIDE HCL 5 MG/ML IJ SOLN
10.0000 mg | Freq: Once | INTRAMUSCULAR | Status: AC
  Administered 2019-12-21: 10 mg via INTRAVENOUS
  Filled 2019-12-21: qty 2

## 2019-12-21 MED ORDER — FENTANYL CITRATE (PF) 100 MCG/2ML IJ SOLN
INTRAMUSCULAR | Status: AC
  Filled 2019-12-21: qty 2

## 2019-12-21 MED ORDER — GLUCAGON HCL RDNA (DIAGNOSTIC) 1 MG IJ SOLR
1.0000 mg | Freq: Once | INTRAMUSCULAR | Status: AC
  Administered 2019-12-21: 1 mg via INTRAVENOUS
  Filled 2019-12-21: qty 1

## 2019-12-21 MED ORDER — MIDAZOLAM HCL 2 MG/2ML IJ SOLN
INTRAMUSCULAR | Status: AC
  Filled 2019-12-21: qty 2

## 2019-12-21 MED ORDER — FENTANYL CITRATE (PF) 100 MCG/2ML IJ SOLN
INTRAMUSCULAR | Status: DC | PRN
  Administered 2019-12-21: 50 ug via INTRAVENOUS
  Administered 2019-12-21: 25 ug via INTRAVENOUS

## 2019-12-21 MED ORDER — ONDANSETRON HCL 4 MG/2ML IJ SOLN
INTRAMUSCULAR | Status: DC | PRN
  Administered 2019-12-21: 4 mg via INTRAVENOUS

## 2019-12-21 MED ORDER — ONDANSETRON HCL 4 MG/2ML IJ SOLN
INTRAMUSCULAR | Status: AC
  Filled 2019-12-21: qty 2

## 2019-12-21 MED ORDER — PROPOFOL 10 MG/ML IV BOLUS
INTRAVENOUS | Status: AC
  Filled 2019-12-21: qty 20

## 2019-12-21 MED ORDER — LIDOCAINE HCL 4 % MT SOLN
OROMUCOSAL | Status: DC | PRN
  Administered 2019-12-21: 4 mL via TOPICAL

## 2019-12-21 MED ORDER — PROPOFOL 10 MG/ML IV BOLUS
INTRAVENOUS | Status: DC | PRN
  Administered 2019-12-21: 100 mg via INTRAVENOUS

## 2019-12-21 MED ORDER — SODIUM CHLORIDE 0.9 % IV SOLN: INTRAVENOUS | Status: DC | PRN

## 2019-12-21 MED ORDER — PANTOPRAZOLE SODIUM 40 MG IV SOLR
40.0000 mg | Freq: Once | INTRAVENOUS | Status: AC
  Administered 2019-12-21: 40 mg via INTRAVENOUS
  Filled 2019-12-21: qty 40

## 2019-12-21 MED ORDER — DIPHENHYDRAMINE HCL 50 MG/ML IJ SOLN
12.5000 mg | Freq: Once | INTRAMUSCULAR | Status: AC
  Administered 2019-12-21: 12.5 mg via INTRAVENOUS
  Filled 2019-12-21: qty 1

## 2019-12-21 MED ORDER — SODIUM CHLORIDE 0.9 % IV BOLUS
1000.0000 mL | Freq: Once | INTRAVENOUS | Status: AC
  Administered 2019-12-21: 1000 mL via INTRAVENOUS

## 2019-12-21 MED ORDER — SUCCINYLCHOLINE CHLORIDE 20 MG/ML IJ SOLN
INTRAMUSCULAR | Status: DC | PRN
  Administered 2019-12-21: 60 mg via INTRAVENOUS

## 2019-12-21 NOTE — Progress Notes (Signed)
Patient was recovered post procedure in the main PACU unit. She was then returned to the Endoscopy unit where discharge instructions were reviewed with her. Discharge instructions were also reviewed with her daughter. Dr. Haig Prophet talked with the patients daughter post procedure as well. Patient was discharged home with her daughter.

## 2019-12-21 NOTE — Transfer of Care (Signed)
Immediate Anesthesia Transfer of Care Note  Patient: Amanda Tucker  Procedure(s) Performed: ESOPHAGOGASTRODUODENOSCOPY (EGD) WITH PROPOFOL (N/A )  Patient Location: PACU  Anesthesia Type:General  Level of Consciousness: sedated  Airway & Oxygen Therapy: Patient Spontanous Breathing and Patient connected to face mask oxygen  Post-op Assessment: Report given to RN and Post -op Vital signs reviewed and stable  Post vital signs: Reviewed and stable  Last Vitals:  Vitals Value Taken Time  BP 126/73 12/21/19 1645  Temp 36.1 C 12/21/19 1645  Pulse 91 12/21/19 1645  Resp 14 12/21/19 1645  SpO2 97 % 12/21/19 1645    Last Pain:  Vitals:   12/21/19 1031  TempSrc:   PainSc: 0-No pain         Complications: No complications documented.

## 2019-12-21 NOTE — Consult Note (Signed)
Consultation  Referring Provider: Dr. Ellender Hose     Admit date: 11/26 Consult date: 11/26         Reason for Consultation:     Food Bolus         HPI:   Amanda Tucker is a 74 y.o. female with history of hypertension, hyperlipdemia, and anxiety who swallowed a piece of prime rib last night and felt it get stuck. This happened once before last year but resolved spontaneously. Patient is in visible distress. No history of asthma and no family history of GI malignancies. No blood thinners except aspirin. No GI surgeries. She smokes cigarettes occasionally and is a social drinker.  Past Medical History:  Diagnosis Date  . Anxiety   . Arthritis   . Balance problem   . Depression   . Diabetes mellitus without complication ([place])   . Diverticulitis   . Environmental and seasonal allergies   . GERD (gastroesophageal reflux disease)   . HOH (hard of hearing)   . Hyperlipidemia   . Hypertension   . IBS (irritable bowel syndrome)   . Osteoporosis   . Right sided sciatica   . Vertigo     Past Surgical History:  Procedure Laterality Date  . CATARACT EXTRACTION W/PHACO Right 09/07/2018   Procedure: CATARACT EXTRACTION PHACO AND INTRAOCULAR LENS PLACEMENT;  Surgeon: Marchia Meiers, MD;  Location: [facility] ORS;  Service: Ophthalmology;  Laterality: Right;  [place]: 01:08.2 CDE: 11.02 Fluid Pack Lot # [id number]  . CATARACT EXTRACTION W/PHACO Left 02/01/2019   Procedure: CATARACT EXTRACTION PHACO AND INTRAOCULAR LENS PLACEMENT ([place]) LEFT VISION BLUE;  Surgeon: Marchia Meiers, MD;  Location: [facility] ORS;  Service: Ophthalmology;  Laterality: Left;  Lot #:  [id number] [place]:  00.51.3 CDE:  6.98  . COLONOSCOPY WITH PROPOFOL N/A 02/27/2018   Procedure: COLONOSCOPY WITH PROPOFOL;  Surgeon: Jonathon Bellows, MD;  Location: [facility]COPY;  Service: Gastroenterology;  Laterality: N/A;    Family History  Problem Relation Age of Onset  . Dementia Mother 35  . Hypertension Father   . Hyperlipidemia Father   . Heart attack Father 42   . Pancreatic cancer Brother   . CVA Maternal Grandfather   . Hypothyroidism Daughter        [place]  . Breast cancer Neg Hx   . Colon cancer Neg Hx     Social History   Tobacco Use  . Smoking status: Current Some Day Smoker    Packs/day: 0.25    Years: 54.00    Pack years: 13.50    Types: Cigarettes    Last attempt to quit: 02/08/2018    Years since quitting: 1.8  . Smokeless tobacco: Never Used  . Tobacco comment: smokes 3 cigarettes out of a pack then throws away  Vaping Use  . Vaping Use: Never used  Substance Use Topics  . Alcohol use: Yes    Alcohol/week: 5.0 standard drinks    Types: 5 Glasses of wine per week  . Drug use: Never    Prior to Admission medications   Medication Sig Start Date End Date Taking? Authorizing Provider  alendronate (FOSAMAX) 70 MG tablet Take 1 tablet (70 mg total) by mouth once a week. Take with a full glass of water on an empty stomach. 03/16/19   Virginia Crews, MD  ALPRAZolam Duanne Moron) 0.5 MG tablet TAKE 1/2 TABLET BY MOUTH TWICE DAILY AS NEEDED FOR ANXIETY 10/18/19   Bacigalupo, [name], MD  amLODipine (NORVASC) 5 MG tablet Take 1 tablet (5 mg total)  by mouth daily. 07/27/19   Mar Daring, PA-C  aspirin EC 81 MG tablet Take 81 mg daily by mouth.    [provider]  aspirin-acetaminophen-caffeine (EXCEDRIN MIGRAINE) [phone] MG tablet Take 2 tablets by mouth as needed for headache.    [provider]  ibuprofen (ADVIL) 200 MG tablet Take 200 mg by mouth every 6 (six) hours as needed.    [provider]  lisinopril (ZESTRIL) 40 MG tablet Take 1 tablet (40 mg total) by mouth daily. 03/08/19   Virginia Crews, MD  Multiple Vitamin (MULTIVITAMIN) tablet Take 1 tablet by mouth daily.    [provider]  sertraline (ZOLOFT) 100 MG tablet Take 1 tablet (100 mg total) by mouth daily. 11/24/18   Virginia Crews, MD  simvastatin (ZOCOR) 40 MG tablet Take 1 tablet (40 mg total) by mouth daily.  03/16/19   Virginia Crews, MD  VITAMIN D PO Take by mouth every morning.    [provider]    Current Facility-Administered Medications  Medication Dose Route Frequency Provider Last Rate Last Admin  . pantoprazole (PROTONIX) injection 40 mg  40 mg Intravenous Once Duffy Bruce, MD       Current Outpatient Medications  Medication Sig Dispense Refill  . alendronate (FOSAMAX) 70 MG tablet Take 1 tablet (70 mg total) by mouth once a week. Take with a full glass of water on an empty stomach. 4 tablet 11  . ALPRAZolam (XANAX) 0.5 MG tablet TAKE 1/2 TABLET BY MOUTH TWICE DAILY AS NEEDED FOR ANXIETY 30 tablet 5  . amLODipine (NORVASC) 5 MG tablet Take 1 tablet (5 mg total) by mouth daily. 90 tablet 1  . aspirin EC 81 MG tablet Take 81 mg daily by mouth.    Marland Kitchen aspirin-acetaminophen-caffeine (EXCEDRIN MIGRAINE) 250-250-65 MG tablet Take 2 tablets by mouth as needed for headache.    . ibuprofen (ADVIL) 200 MG tablet Take 200 mg by mouth every 6 (six) hours as needed.    Marland Kitchen lisinopril (ZESTRIL) 40 MG tablet Take 1 tablet (40 mg total) by mouth daily. 90 tablet 3  . Multiple Vitamin (MULTIVITAMIN) tablet Take 1 tablet by mouth daily.    . sertraline (ZOLOFT) 100 MG tablet Take 1 tablet (100 mg total) by mouth daily. 90 tablet 3  . simvastatin (ZOCOR) 40 MG tablet Take 1 tablet (40 mg total) by mouth daily. 90 tablet 3  . VITAMIN D PO Take by mouth every morning.      Allergies as of 12/21/2019  . (No Known Allergies)     Review of Systems:    All systems reviewed and negative except where noted in HPI.  Review of Systems  Constitutional: Negative for chills and fever.  Respiratory: Positive for shortness of breath.   Cardiovascular: Negative for chest pain.  Gastrointestinal: Positive for nausea and vomiting. Negative for abdominal pain, blood in stool, constipation, diarrhea and melena.  Musculoskeletal: Negative for neck pain.  Psychiatric/Behavioral: Negative for substance  abuse.  All other systems reviewed and are negative.     Physical Exam:  Vital signs in last 24 hours: Temp:  [98.6 F (37 C)] 98.6 F (37 C) (11/26 1025) Pulse Rate:  [81-89] 89 (11/26 1358) Resp:  [18] 18 (11/26 1358) BP: (107-127)/(78-85) 127/85 (11/26 1358) SpO2:  [97 %] 97 % (11/26 1358) Weight:  [42.6 kg] 42.6 kg (11/26 1025)   General:   Patient in visible distress Head:  Normocephalic and atraumatic. Eyes:   No icterus.  Conjunctiva pink. Mouth: Mucosa pink moist, no lesions. Neck:  No jvd Lungs:  No respiratory distress Abdomen: Non-distended Msk:   No clubbing or cyanosis. Neurologic:  Alert and  oriented x4;  No focal deficits Skin:  Warm, dry, pink without significant lesions or rashes. Psych:  Alert and cooperative.  LAB RESULTS: Recent Labs    12/21/19 1035  WBC 7.8  HGB 14.0  HCT 41.8  PLT 215   BMET Recent Labs    12/21/19 1035  NA 139  K 4.2  CL 103  CO2 23  GLUCOSE 104*  BUN 16  CREATININE 0.72  CALCIUM 9.2   LFT No results for input(s): PROT, ALBUMIN, AST, ALT, ALKPHOS, BILITOT, BILIDIR, IBILI in the last 72 hours. PT/INR No results for input(s): LABPROT, INR in the last 72 hours.  STUDIES: DG Chest 2 View  Result Date: 12/21/2019 CLINICAL DATA:  Food impaction, difficulty swallowing EXAM: CHEST - 2 VIEW COMPARISON:  None. FINDINGS: No focal consolidation, pneumothorax or pleural effusion. Cardiomediastinal silhouette within normal limits. Aortic atherosclerotic calcifications. No acute osseous abnormality. No suspicious radiopaque foreign body. IMPRESSION: No acute cardiopulmonary abnormality. Electronically Signed   By: Primitivo Gauze M.D.   On: 12/21/2019 13:46       Impression / Plan:   74 y/o lady with food bolus and inability to control secretions  - plan for EGD now - further recs after procedure  Raylene Miyamoto MD, MPH [place]

## 2019-12-21 NOTE — Op Note (Signed)
[facility] Gastroenterology Patient Name: Amanda Tucker Procedure Date: 12/21/2019 3:16 PM MRN: [id number] Account #: [id number] Date of Birth: 04/26/1945 Admit Type: Outpatient Age: 74 Room: [facility] ENDO ROOM 4 Gender: Female Note Status: Finalized Procedure:             Upper GI endoscopy Indications:           Foreign body in the esophagus Providers:             Andrey Farmer MD, MD Medicines:             Monitored Anesthesia Care Complications:         No immediate complications. Estimated blood loss:                         Minimal. Procedure:             Pre-Anesthesia Assessment:                        - Prior to the procedure, a History and Physical was                         performed, and patient medications and allergies were                         reviewed. The patient is competent. The risks and                         benefits of the procedure and the sedation options and                         risks were discussed with the patient. All questions                         were answered and informed consent was obtained.                         Patient identification and proposed procedure were                         verified by the physician, the nurse, the anesthetist                         and the technician in the endoscopy suite. Mental                         Status Examination: alert and oriented. Airway                         Examination: normal oropharyngeal airway and neck                         mobility. Respiratory Examination: clear to                         auscultation. CV Examination: normal. Prophylactic                         Antibiotics: The patient does not require prophylactic  antibiotics. Prior Anticoagulants: The patient has                         taken no previous anticoagulant or antiplatelet                         agents. ASA Grade Assessment: E - Emergency. After                          reviewing the risks and benefits, the patient was                         deemed in satisfactory condition to undergo the                         procedure. The anesthesia plan was to use monitored                         anesthesia care (MAC). Immediately prior to                         administration of medications, the patient was                         re-assessed for adequacy to receive sedatives. The                         heart rate, respiratory rate, oxygen saturations,                         blood pressure, adequacy of pulmonary ventilation, and                         response to care were monitored throughout the                         procedure. The physical status of the patient was                         re-assessed after the procedure.                        After obtaining informed consent, the endoscope was                         passed under direct vision. Throughout the procedure,                         the patient's blood pressure, pulse, and oxygen                         saturations were monitored continuously. The Endoscope                         was introduced through the mouth, and advanced to the                         second part of duodenum. The upper GI endoscopy was  technically difficult and complex due to presence of                         foreign body. The patient tolerated the procedure well. Findings:      The examined esophagus was moderately tortuous. The lower esophageal       sphincter was tight when passing into the stomach.      Food was found in the lower third of the esophagus. Removal of food was       accomplished using tripod and net. Food was partially digested so       required multiple passess to completely remove.      A medium-sized hiatal hernia was present. Possible paraesophageal hernia      The exam of the stomach was otherwise normal.      The examined duodenum was normal. Impression:            -  Food in the lower third of the esophagus. Removal                         was successful.                        - Medium-sized hiatal hernia.                        - Normal examined duodenum.                        - Tortuous esophagus. Recommendation:        - Discharge patient to home.                        - Soft diet.                        - Use Protonix (pantoprazole) 40 mg PO daily or any                         other PPI daily.                        - Refer to a gastroenterologist at appointment to be                         scheduled.                        - Repeat upper endoscopy in 4 weeks. Would first                         perform a barium swallow test before endoscopy to                         assess for paraesophageal hernia and subtle strictures Procedure Code(s):     --- Professional ---                        [phone], Esophagogastroduodenoscopy, flexible,                         transoral; with removal of foreign body(s) Diagnosis Code(s):     ---  Professional ---                        B63.845X, Food in esophagus causing other injury,                         initial encounter                        K44.9, Diaphragmatic hernia without obstruction or                         gangrene                        Q39.9, Congenital malformation of esophagus,                         unspecified                        T18.108A, Unspecified foreign body in esophagus                         causing other injury, initial encounter CPT copyright 2019 American Medical Association. All rights reserved. The codes documented in this report are preliminary and upon coder review may  be revised to meet current compliance requirements. Andrey Farmer, MD Andrey Farmer MD, MD 12/21/2019 4:45:33 PM Number of Addenda: 0 Note Initiated On: 12/21/2019 3:16 PM Estimated Blood Loss:  Estimated blood loss was minimal.      [facility]

## 2019-12-21 NOTE — Anesthesia Postprocedure Evaluation (Signed)
Anesthesia Post Note  Patient: Amanda Tucker  Procedure(s) Performed: ESOPHAGOGASTRODUODENOSCOPY (EGD) WITH PROPOFOL (N/A )  Patient location during evaluation: PACU Anesthesia Type: General Level of consciousness: awake and alert Pain management: pain level controlled Vital Signs Assessment: post-procedure vital signs reviewed and stable Respiratory status: spontaneous breathing, nonlabored ventilation, respiratory function stable and patient connected to nasal cannula oxygen Cardiovascular status: blood pressure returned to baseline and stable Postop Assessment: no apparent nausea or vomiting Anesthetic complications: no   No complications documented.   Last Vitals:  Vitals:   12/21/19 1700 12/21/19 1719  BP: (!) 128/56 111/67  Pulse: 99 91  Resp: 18 16  Temp:  37.2 C  SpO2: 93% 93%    Last Pain:  Vitals:   12/21/19 1719  TempSrc:   PainSc: 2                  Precious Haws [name]

## 2019-12-21 NOTE — ED Notes (Signed)
VORB for blood work, per Dr. Orlean Patten, no imaging needed at this time.

## 2019-12-21 NOTE — Anesthesia Preprocedure Evaluation (Addendum)
Anesthesia Evaluation  Patient identified by MRN, date of birth, ID band Patient awake    Reviewed: Allergy & Precautions, H&P , NPO status , Patient's Chart, lab work & pertinent test results  History of Anesthesia Complications Negative for: history of anesthetic complications  Airway Mallampati: III  TM Distance: <3 FB Neck ROM: limited    Dental  (+) Chipped, Missing, Poor Dentition, Loose   Pulmonary neg shortness of breath, COPD, Current Smoker and Patient abstained from smoking.,    Pulmonary exam normal        Cardiovascular Exercise Tolerance: Good hypertension, (-) Past MI Normal cardiovascular exam     Neuro/Psych PSYCHIATRIC DISORDERS  Neuromuscular disease    GI/Hepatic Neg liver ROS, GERD  ,  Endo/Other  diabetes, Type 2  Renal/GU      Musculoskeletal  (+) Arthritis ,   Abdominal   Peds  Hematology negative hematology ROS (+)   Anesthesia Other Findings Food Bolus  Past Medical History: No date: Anxiety No date: Arthritis No date: Balance problem No date: Depression No date: Diabetes mellitus without complication (HCC) No date: Diverticulitis No date: Environmental and seasonal allergies No date: GERD (gastroesophageal reflux disease) No date: HOH (hard of hearing) No date: Hyperlipidemia No date: Hypertension No date: IBS (irritable bowel syndrome) No date: Osteoporosis No date: Right sided sciatica No date: Vertigo  Past Surgical History: 09/07/2018: CATARACT EXTRACTION W/PHACO; Right     Comment:  Procedure: CATARACT EXTRACTION PHACO AND INTRAOCULAR               LENS PLACEMENT;  Surgeon: Marchia Meiers, MD;  Location:               [facility] ORS;  Service: Ophthalmology;  Laterality: Right;                [place]: 01:08.2 CDE: 11.02 Fluid Pack Lot # [id number] 02/01/2019: CATARACT EXTRACTION W/PHACO; Left     Comment:  Procedure: CATARACT EXTRACTION PHACO AND INTRAOCULAR               LENS  PLACEMENT ([place]) LEFT VISION BLUE;  Surgeon: Marchia Meiers, MD;  Location: [facility] ORS;  Service: Ophthalmology;               Laterality: Left;  Lot #:  [id number] [place]:  00.51.3 CDE:                6.98 02/27/2018: COLONOSCOPY WITH PROPOFOL; N/A     Comment:  Procedure: COLONOSCOPY WITH PROPOFOL;  Surgeon: Jonathon Bellows, MD;  Location: [facility] ENDOSCOPY;  Service:               Gastroenterology;  Laterality: N/A;  BMI    Body Mass Index: 19.65 kg/m      Reproductive/Obstetrics negative OB ROS                           Anesthesia Physical Anesthesia Plan  ASA: III and emergent  Anesthesia Plan: General ETT, Rapid Sequence and Cricoid Pressure   Post-op Pain Management:    Induction: Intravenous  PONV Risk Score and Plan: Ondansetron, Dexamethasone, Midazolam and Treatment may vary due to age or medical condition  Airway Management Planned: Oral ETT  Additional Equipment:   Intra-op Plan:   Post-operative Plan: Extubation in OR  Informed  Consent: I have reviewed the patients History and Physical, chart, labs and discussed the procedure including the risks, benefits and alternatives for the proposed anesthesia with the patient or authorized representative who has indicated his/her understanding and acceptance.     Dental Advisory Given  Plan Discussed with: Anesthesiologist, CRNA and Surgeon  Anesthesia Plan Comments: (Patient consented for risks of anesthesia including but not limited to:  - adverse reactions to medications - damage to eyes, teeth, lips or other oral mucosa - nerve damage due to positioning  - sore throat or hoarseness - Damage to heart, brain, nerves, lungs, other parts of body or loss of life  Patient voiced understanding.)       Anesthesia Quick Evaluation

## 2019-12-21 NOTE — Anesthesia Procedure Notes (Signed)
Procedure Name: Intubation Date/Time: 12/21/2019 3:46 PM Performed by: Hedda Slade, CRNA Pre-anesthesia Checklist: Patient identified, Patient being monitored, Timeout performed, Emergency Drugs available and Suction available Patient Re-evaluated:Patient Re-evaluated prior to induction Oxygen Delivery Method: Circle system utilized Preoxygenation: Pre-oxygenation with 100% oxygen Induction Type: IV induction, Rapid sequence and Cricoid Pressure applied Laryngoscope Size: 3 and McGraph Grade View: Grade I Tube type: Oral Tube size: 6.5 mm Number of attempts: 1 Airway Equipment and Method: Stylet Placement Confirmation: ETT inserted through vocal cords under direct vision,  positive ETCO2 and breath sounds checked- equal and bilateral Secured at: 21 cm Tube secured with: Tape Dental Injury: Teeth and Oropharynx as per pre-operative assessment

## 2019-12-21 NOTE — ED Provider Notes (Signed)
[facility] Emergency Department Provider Note  ____________________________________________   First MD Initiated Contact with Patient 12/21/19 1252     (approximate)  I have reviewed the triage vital signs and the nursing notes.   HISTORY  Chief Complaint Foreign Body in Throat    HPI Amanda Tucker is a 74 y.o. female  Here with difficulty swallowing. Pt has h/o HTN, HLD, and one episode of prior choking/difficulty swallowing. She was eating Thanksgiving dinner last night when she felt like food, likely prime rib, got stuck in her throat/lower chest. She has since been essentially unable to eat/drink anything, and is now having difficulty swallowing her spit. She reports she feels like food is "stuck" in her upper chest when she swallows. She had one or two episodes of emesis, otherwise is just spitting up. No cough, SOB. No fever, chills. Denies h/o GERD or known esophageal strictures/achalasia. She does report one issue similar to this in the past that resolved spontaneously. Has never had an EGD.        Past Medical History:  Diagnosis Date  . Anxiety   . Arthritis   . Balance problem   . Depression   . Diabetes mellitus without complication ([place])   . Diverticulitis   . Environmental and seasonal allergies   . GERD (gastroesophageal reflux disease)   . HOH (hard of hearing)   . Hyperlipidemia   . Hypertension   . IBS (irritable bowel syndrome)   . Osteoporosis   . Right sided sciatica   . Vertigo     Patient Active Problem List   Diagnosis Date Noted  . IBS (irritable bowel syndrome) 03/09/2018  . Hypokalemia 03/09/2018  . Age-related osteoporosis without current pathological fracture 03/09/2018  . Baker's cyst of knee, right 06/06/2017  . Insomnia 03/07/2017  . Tobacco abuse 12/01/2016  . Hypertension   . Hyperlipidemia   . MDD (major depressive disorder)   . Generalized anxiety disorder     Past Surgical History:  Procedure  Laterality Date  . CATARACT EXTRACTION W/PHACO Right 09/07/2018   Procedure: CATARACT EXTRACTION PHACO AND INTRAOCULAR LENS PLACEMENT;  Surgeon: Marchia Meiers, MD;  Location: [facility] ORS;  Service: Ophthalmology;  Laterality: Right;  [place]: 01:08.2 CDE: 11.02 Fluid Pack Lot # [id number]  . CATARACT EXTRACTION W/PHACO Left 02/01/2019   Procedure: CATARACT EXTRACTION PHACO AND INTRAOCULAR LENS PLACEMENT ([place]) LEFT VISION BLUE;  Surgeon: Marchia Meiers, MD;  Location: [facility] ORS;  Service: Ophthalmology;  Laterality: Left;  Lot #:  [id number] [place]:  00.51.3 CDE:  6.98  . COLONOSCOPY WITH PROPOFOL N/A 02/27/2018   Procedure: COLONOSCOPY WITH PROPOFOL;  Surgeon: Jonathon Bellows, MD;  Location: [facility] ENDOSCOPY;  Service: Gastroenterology;  Laterality: N/A;    Prior to Admission medications   Medication Sig Start Date End Date Taking? Authorizing Provider  alendronate (FOSAMAX) 70 MG tablet Take 1 tablet (70 mg total) by mouth once a week. Take with a full glass of water on an empty stomach. 03/16/19  Yes Bacigalupo, [name], MD  ALPRAZolam Duanne Moron) 0.5 MG tablet TAKE 1/2 TABLET BY MOUTH TWICE DAILY AS NEEDED FOR ANXIETY Patient taking differently: Take 0.25 mg by mouth 2 (two) times daily as needed for anxiety.  10/18/19  Yes Bacigalupo, [name], MD  amLODipine (NORVASC) 5 MG tablet Take 1 tablet (5 mg total) by mouth daily. 07/27/19  Yes Mar Daring, PA-C  aspirin EC 81 MG tablet Take 81 mg daily by mouth.   Yes [provider]  lisinopril (ZESTRIL) 40 MG tablet Take 1 tablet (40 mg total) by mouth daily. 03/08/19  Yes Bacigalupo, [name], MD  sertraline (ZOLOFT) 100 MG tablet Take 1 tablet (100 mg total) by mouth daily. 11/24/18  Yes Bacigalupo, [name], MD  simvastatin (ZOCOR) 40 MG tablet Take 1 tablet (40 mg total) by mouth daily. 03/16/19  Yes Bacigalupo, [name], MD    Allergies Patient has no known allergies.  Family History  Problem Relation Age of Onset  . Dementia Mother 43  . Hypertension  Father   . Hyperlipidemia Father   . Heart attack Father 26  . Pancreatic cancer Brother   . CVA Maternal Grandfather   . Hypothyroidism Daughter        [place]  . Breast cancer Neg Hx   . Colon cancer Neg Hx     Social History Social History   Tobacco Use  . Smoking status: Current Some Day Smoker    Packs/day: 0.25    Years: 54.00    Pack years: 13.50    Types: Cigarettes    Last attempt to quit: 02/08/2018    Years since quitting: 1.8  . Smokeless tobacco: Never Used  . Tobacco comment: smokes 3 cigarettes out of a pack then throws away  Vaping Use  . Vaping Use: Never used  Substance Use Topics  . Alcohol use: Yes    Alcohol/week: 5.0 standard drinks    Types: 5 Glasses of wine per week  . Drug use: Never    Review of Systems  Review of Systems  Constitutional: Negative for fatigue and fever.  HENT: Positive for trouble swallowing. Negative for congestion and sore throat.   Eyes: Negative for visual disturbance.  Respiratory: Negative for cough and shortness of breath.   Cardiovascular: Positive for chest pain.  Gastrointestinal: Negative for abdominal pain, diarrhea, nausea and vomiting.  Genitourinary: Negative for flank pain.  Musculoskeletal: Negative for back pain and neck pain.  Skin: Negative for rash and wound.  Neurological: Negative for weakness.  All other systems reviewed and are negative.    ____________________________________________  PHYSICAL EXAM:      VITAL SIGNS: ED Triage Vitals  Enc Vitals Group     BP 12/21/19 1025 107/78     Pulse Rate 12/21/19 1025 81     Resp 12/21/19 1025 18     Temp 12/21/19 1025 98.6 F (37 C)     Temp Source 12/21/19 1025 Oral     SpO2 12/21/19 1025 97 %     Weight 12/21/19 1025 94 lb (42.6 kg)     Height 12/21/19 1025 4\' 10"  (1.473 m)     Head Circumference --      Peak Flow --      Pain Score 12/21/19 1031 0     Pain Loc --      Pain Edu? --      Excl. in [place]? --      Physical Exam Vitals  and nursing note reviewed.  Constitutional:      General: She is not in acute distress.    Appearance: She is well-developed.  HENT:     Head: Normocephalic and atraumatic.  Eyes:     Conjunctiva/sclera: Conjunctivae normal.  Cardiovascular:     Rate and Rhythm: Normal rate and regular rhythm.     Heart sounds: Normal heart sounds. No murmur heard.  No friction rub.  Pulmonary:     Effort: Pulmonary effort is normal. No respiratory distress.  Breath sounds: Normal breath sounds. No wheezing or rales.  Abdominal:     General: There is no distension.     Palpations: Abdomen is soft.     Tenderness: There is no abdominal tenderness.  Musculoskeletal:     Cervical back: Neck supple.  Skin:    General: Skin is warm.     Capillary Refill: Capillary refill takes less than 2 seconds.  Neurological:     Mental Status: She is alert and oriented to person, place, and time.     Motor: No abnormal muscle tone.       ____________________________________________   LABS (all labs ordered are listed, but only abnormal results are displayed)  Labs Reviewed  BASIC METABOLIC PANEL - Abnormal; Notable for the following components:      Result Value   Glucose, Bld 104 (*)    All other components within normal limits  RESP PANEL BY RT-PCR (FLU A&B, COVID) ARPGX2  CBC WITH DIFFERENTIAL/PLATELET    ____________________________________________  EKG:  ________________________________________  RADIOLOGY All imaging, including plain films, CT scans, and ultrasounds, independently reviewed by me, and interpretations confirmed via formal radiology reads.  ED MD interpretation:   CXR: Clear, no significant aspiration, no PTX  Official radiology report(s): DG Chest 2 View  Result Date: 12/21/2019 CLINICAL DATA:  Food impaction, difficulty swallowing EXAM: CHEST - 2 VIEW COMPARISON:  None. FINDINGS: No focal consolidation, pneumothorax or pleural effusion. Cardiomediastinal silhouette  within normal limits. Aortic atherosclerotic calcifications. No acute osseous abnormality. No suspicious radiopaque foreign body. IMPRESSION: No acute cardiopulmonary abnormality. Electronically Signed   By: Primitivo Gauze M.D.   On: 12/21/2019 13:46    ____________________________________________  PROCEDURES   Procedure(s) performed (including Critical Care):  Procedures  ____________________________________________  INITIAL IMPRESSION / MDM / [place] / ED COURSE  As part of my medical decision making, I reviewed the following data within the [place] notes reviewed and incorporated, Old chart reviewed, Notes from prior ED visits, and [place] Controlled Substance Database       *Amanda Tucker was evaluated in Emergency Department on 12/21/2019 for the symptoms described in the history of present illness. She was evaluated in the context of the global COVID-19 pandemic, which necessitated consideration that the patient might be at risk for infection with the SARS-CoV-2 virus that causes COVID-19. Institutional protocols and algorithms that pertain to the evaluation of patients at risk for COVID-19 are in a state of rapid change based on information released by regulatory bodies including the CDC and federal and state organizations. These policies and algorithms were followed during the patient's care in the ED.  Some ED evaluations and interventions may be delayed as a result of limited staffing during the pandemic.*     Medical Decision Making:  74 yo F here with likely esophageal food impaction. No cough, SOB, or signs of aspiration. CXR is clear, no PTX or foreign bodies. Labs overall unremarkable - CBC, BMP normal. Pt with persistent difficulty swallowing despite IV meds. Dr. Haig Prophet consulted, will take pt for EGD.   ____________________________________________  FINAL CLINICAL IMPRESSION(S) / ED DIAGNOSES  Final diagnoses:  Food impaction of  esophagus, initial encounter     MEDICATIONS GIVEN DURING THIS VISIT:  Medications  glucagon (human recombinant) (GLUCAGEN) injection 1 mg (1 mg Intravenous Given 12/21/19 1355)  sodium chloride 0.9 % bolus 1,000 mL (0 mLs Intravenous Stopped 12/21/19 1528)  metoCLOPramide (REGLAN) injection 10 mg (10 mg Intravenous Given 12/21/19 1355)  diphenhydrAMINE (  BENADRYL) injection 12.5 mg (12.5 mg Intravenous Given 12/21/19 1355)  ondansetron (ZOFRAN) injection 4 mg (4 mg Intravenous Given 12/21/19 1428)  pantoprazole (PROTONIX) injection 40 mg (40 mg Intravenous Given 12/21/19 1528)     ED Discharge Orders    None       Note:  This document was prepared using Dragon voice recognition software and may include unintentional dictation errors.   Duffy Bruce, MD 12/21/19 [phone]

## 2019-12-21 NOTE — Telephone Encounter (Signed)
Patients daughter is calling to report patient got food stuck in throat last night- has not been able to dislodge it. Some vomiting when try's to drink- not clearing the throat. Advised ED for evaluation.  Reason for Disposition . Symptoms of food or bone stuck in throat or esophagus (e.g., pain in throat or chest, FB sensation, blood-tinged saliva)  Answer Assessment - Initial Assessment Questions 1. SYMPTOM: "Are you having difficulty swallowing liquids, solids, or both?"     Difficultly swollowing 2. ONSET: "When did the swallowing problems begin?"      Since last night 3. CAUSE: "What do you think is causing the problem?"      Last night food got stuck in throat 4. CHRONIC/RECURRENT: "Is this a new problem for you?"  If no, ask: "How long have you had this problem?" (e.g., days, weeks, months)      Has happened before- cleared its self 5. OTHER SYMPTOMS: "Do you have any other symptoms?" (e.g., difficulty breathing, sore throat, swollen tongue, chest pain)     Pain with trying to swollow 6. PREGNANCY: "Is there any chance you are pregnant?" "When was your last menstrual period?"     n/a  Protocols used: SWALLOWING DIFFICULTY-A-AH

## 2019-12-21 NOTE — ED Triage Notes (Signed)
Pt presents to ED via POV, states feels like she is having increasing difficulty swallowing at this time. Pt states yesterday felt like she was having some difficulty swallowing, states now feels like she can't get anything more than a sip of water down. Pt A&O x 4, noted to be intermittently spitting into a tissue in triage. Pt denies any pain at this time.

## 2019-12-24 ENCOUNTER — Encounter: Payer: Self-pay | Admitting: Gastroenterology

## 2019-12-24 ENCOUNTER — Other Ambulatory Visit (HOSPITAL_COMMUNITY): Payer: Self-pay | Admitting: Gastroenterology

## 2019-12-24 ENCOUNTER — Other Ambulatory Visit: Payer: Self-pay | Admitting: Gastroenterology

## 2019-12-24 DIAGNOSIS — R1310 Dysphagia, unspecified: Secondary | ICD-10-CM

## 2019-12-27 ENCOUNTER — Ambulatory Visit: Payer: No Typology Code available for payment source

## 2020-01-01 ENCOUNTER — Ambulatory Visit
Admission: RE | Admit: 2020-01-01 | Discharge: 2020-01-01 | Disposition: A | Discharge status: Home / Self Care | Payer: Medicare Other | Source: Ambulatory Visit | Attending: Gastroenterology | Admitting: Gastroenterology

## 2020-01-01 ENCOUNTER — Other Ambulatory Visit: Payer: Self-pay

## 2020-01-01 ENCOUNTER — Other Ambulatory Visit: Payer: Self-pay | Admitting: Gastroenterology

## 2020-01-01 DIAGNOSIS — R1310 Dysphagia, unspecified: Secondary | ICD-10-CM | POA: Diagnosis not present

## 2020-02-01 ENCOUNTER — Other Ambulatory Visit
Admission: RE | Admit: 2020-02-01 | Discharge: 2020-02-01 | Disposition: A | Discharge status: Home / Self Care | Payer: Medicare Other | Source: Ambulatory Visit | Attending: Gastroenterology | Admitting: Gastroenterology

## 2020-02-01 DIAGNOSIS — Z01812 Encounter for preprocedural laboratory examination: Secondary | ICD-10-CM | POA: Diagnosis not present

## 2020-02-01 DIAGNOSIS — Z20822 Contact with and (suspected) exposure to covid-19: Secondary | ICD-10-CM | POA: Diagnosis not present

## 2020-02-02 LAB — SARS CORONAVIRUS 2 (TAT 6-24 HRS): SARS Coronavirus 2: NEGATIVE

## 2020-02-05 ENCOUNTER — Ambulatory Visit
Admission: RE | Admit: 2020-02-05 | Discharge: 2020-02-05 | Disposition: A | Discharge status: Home / Self Care | Payer: Medicare Other | Attending: Gastroenterology | Admitting: Gastroenterology

## 2020-02-05 ENCOUNTER — Ambulatory Visit: Payer: Medicare Other | Admitting: Anesthesiology

## 2020-02-05 ENCOUNTER — Encounter: Payer: Self-pay | Admitting: *Deleted

## 2020-02-05 ENCOUNTER — Other Ambulatory Visit: Payer: Self-pay

## 2020-02-05 ENCOUNTER — Encounter
Admission: RE | Disposition: A | Discharge status: Home / Self Care | Payer: Self-pay | Source: Home / Self Care | Attending: Gastroenterology

## 2020-02-05 DIAGNOSIS — K2100 Gastro-esophageal reflux disease with esophagitis, without bleeding: Secondary | ICD-10-CM | POA: Diagnosis not present

## 2020-02-05 DIAGNOSIS — Z79899 Other long term (current) drug therapy: Secondary | ICD-10-CM | POA: Diagnosis not present

## 2020-02-05 DIAGNOSIS — Z7982 Long term (current) use of aspirin: Secondary | ICD-10-CM | POA: Insufficient documentation

## 2020-02-05 DIAGNOSIS — Z7983 Long term (current) use of bisphosphonates: Secondary | ICD-10-CM | POA: Insufficient documentation

## 2020-02-05 DIAGNOSIS — F17210 Nicotine dependence, cigarettes, uncomplicated: Secondary | ICD-10-CM | POA: Diagnosis not present

## 2020-02-05 DIAGNOSIS — K449 Diaphragmatic hernia without obstruction or gangrene: Secondary | ICD-10-CM | POA: Insufficient documentation

## 2020-02-05 DIAGNOSIS — K219 Gastro-esophageal reflux disease without esophagitis: Secondary | ICD-10-CM | POA: Diagnosis present

## 2020-02-05 DIAGNOSIS — K222 Esophageal obstruction: Secondary | ICD-10-CM | POA: Insufficient documentation

## 2020-02-05 DIAGNOSIS — Q399 Congenital malformation of esophagus, unspecified: Secondary | ICD-10-CM | POA: Diagnosis not present

## 2020-02-05 DIAGNOSIS — R1310 Dysphagia, unspecified: Secondary | ICD-10-CM | POA: Diagnosis not present

## 2020-02-05 HISTORY — PX: ESOPHAGOGASTRODUODENOSCOPY: SHX5428

## 2020-02-05 SURGERY — EGD (ESOPHAGOGASTRODUODENOSCOPY)
Anesthesia: General

## 2020-02-05 MED ORDER — EPHEDRINE SULFATE 50 MG/ML IJ SOLN
INTRAMUSCULAR | Status: DC | PRN
  Administered 2020-02-05: 10 mg via INTRAVENOUS

## 2020-02-05 MED ORDER — PROPOFOL 500 MG/50ML IV EMUL
INTRAVENOUS | Status: AC
  Filled 2020-02-05: qty 50

## 2020-02-05 MED ORDER — LIDOCAINE HCL (PF) 2 % IJ SOLN
INTRAMUSCULAR | Status: AC
  Filled 2020-02-05: qty 5

## 2020-02-05 MED ORDER — PROPOFOL 500 MG/50ML IV EMUL
INTRAVENOUS | Status: DC | PRN
  Administered 2020-02-05: 150 ug/kg/min via INTRAVENOUS

## 2020-02-05 MED ORDER — SODIUM CHLORIDE 0.9 % IV SOLN: INTRAVENOUS | Status: DC

## 2020-02-05 MED ORDER — PROPOFOL 10 MG/ML IV BOLUS
INTRAVENOUS | Status: DC | PRN
Start: 2020-02-05 — End: 2020-02-05
  Administered 2020-02-05: 10 mg via INTRAVENOUS
  Administered 2020-02-05: 40 mg via INTRAVENOUS

## 2020-02-05 MED ORDER — LIDOCAINE 2% (20 MG/ML) 5 ML SYRINGE
INTRAMUSCULAR | Status: DC | PRN
  Administered 2020-02-05: 80 mg via INTRAVENOUS

## 2020-02-05 NOTE — Anesthesia Postprocedure Evaluation (Signed)
Anesthesia Post Note  Patient: Amanda Tucker  Procedure(s) Performed: ESOPHAGOGASTRODUODENOSCOPY (EGD) (N/A )  Patient location during evaluation: Endoscopy Anesthesia Type: General Level of consciousness: awake and alert Pain management: pain level controlled Vital Signs Assessment: post-procedure vital signs reviewed and stable Respiratory status: spontaneous breathing and respiratory function stable Cardiovascular status: stable Anesthetic complications: no   No complications documented.   Last Vitals:  Vitals:   02/05/20 0847 02/05/20 0857  BP: 115/74 116/72  Pulse: 72 70  Resp: 18 15  Temp:    SpO2: 96% 99%    Last Pain:  Vitals:   02/05/20 0857  TempSrc:   PainSc: 0-No pain                 [name]

## 2020-02-05 NOTE — Anesthesia Preprocedure Evaluation (Signed)
Anesthesia Evaluation  Patient identified by MRN, date of birth, ID band Patient awake    Reviewed: Allergy & Precautions, NPO status , Patient's Chart, lab work & pertinent test results  History of Anesthesia Complications Negative for: history of anesthetic complications  Airway Mallampati: II       Dental  (+) Loose, Chipped, Missing,    Pulmonary neg sleep apnea, neg COPD, Current Smoker,           Cardiovascular hypertension, Pt. on medications (-) Past MI and (-) CHF (-) dysrhythmias (-) Valvular Problems/Murmurs     Neuro/Psych neg Seizures Anxiety Depression    GI/Hepatic Neg liver ROS, GERD  Medicated,  Endo/Other  neg diabetes  Renal/GU negative Renal ROS     Musculoskeletal   Abdominal   Peds  Hematology   Anesthesia Other Findings   Reproductive/Obstetrics                             Anesthesia Physical Anesthesia Plan  ASA: II  Anesthesia Plan: General   Post-op Pain Management:    Induction: Intravenous  PONV Risk Score and Plan: 2 and Propofol infusion and TIVA  Airway Management Planned: Nasal Cannula  Additional Equipment:   Intra-op Plan:   Post-operative Plan:   Informed Consent: I have reviewed the patients History and Physical, chart, labs and discussed the procedure including the risks, benefits and alternatives for the proposed anesthesia with the patient or authorized representative who has indicated his/her understanding and acceptance.       Plan Discussed with:   Anesthesia Plan Comments:         Anesthesia Quick Evaluation

## 2020-02-05 NOTE — Transfer of Care (Signed)
Immediate Anesthesia Transfer of Care Note  Patient: Amanda Tucker  Procedure(s) Performed: ESOPHAGOGASTRODUODENOSCOPY (EGD) (N/A )  Patient Location: PACU  Anesthesia Type:General  Level of Consciousness: awake, alert  and oriented  Airway & Oxygen Therapy: Patient Spontanous Breathing and Patient connected to nasal cannula oxygen  Post-op Assessment: Report given to RN and Post -op Vital signs reviewed and stable  Post vital signs: Reviewed and stable  Last Vitals:  Vitals Value Taken Time  BP 106/72 02/05/20 0827  Temp 36.6 C 02/05/20 0827  Pulse 89 02/05/20 0829  Resp 17 02/05/20 0829  SpO2 97 % 02/05/20 0829  Vitals shown include unvalidated device data.  Last Pain:  Vitals:   02/05/20 0827  TempSrc: Temporal  PainSc: 0-No pain         Complications: No complications documented.

## 2020-02-05 NOTE — Interval H&P Note (Signed)
History and Physical Interval Note:  02/05/2020 8:01 AM  Amanda Tucker  has presented today for surgery, with the diagnosis of DYSPHAGIA.  The various methods of treatment have been discussed with the patient and family. After consideration of risks, benefits and other options for treatment, the patient has consented to  Procedure(s): ESOPHAGOGASTRODUODENOSCOPY (EGD) (N/A) as a surgical intervention.  The patient's history has been reviewed, patient examined, no change in status, stable for surgery.  I have reviewed the patient's chart and labs.  Questions were answered to the patient's satisfaction.     Lesly Rubenstein  Ok to proceed with EGD

## 2020-02-05 NOTE — Anesthesia Procedure Notes (Signed)
Date/Time: 02/05/2020 8:05 AM Performed by: Allean Found, CRNA Pre-anesthesia Checklist: Emergency Drugs available, Suction available, Patient being monitored, Patient identified and Timeout performed Patient Re-evaluated:Patient Re-evaluated prior to induction Oxygen Delivery Method: Nasal cannula Placement Confirmation: positive ETCO2

## 2020-02-05 NOTE — Op Note (Signed)
[name] Procedure Date: 02/05/2020 7:50 AM MRN: [id number] Account #: [id number] Date of Birth: Jan 16, 1946 Admit Type: Outpatient Age: 75 Room: [facility] ENDO ROOM 1 Gender: Female Note Status: Finalized Procedure:             Upper GI endoscopy Indications:           Dysphagia, Gastro-esophageal reflux disease Providers:             Andrey Farmer MD, MD Referring MD:          [name]. Bacigalupo (Referring MD) Medicines:             Monitored Anesthesia Care Complications:         No immediate complications. Estimated blood loss:                         Minimal. Procedure:             Pre-Anesthesia Assessment:                        - Prior to the procedure, a History and Physical was                         performed, and patient medications and allergies were                         reviewed. The patient is competent. The risks and                         benefits of the procedure and the sedation options and                         risks were discussed with the patient. All questions                         were answered and informed consent was obtained.                         Patient identification and proposed procedure were                         verified by the physician, the nurse, the anesthetist                         and the technician in the endoscopy suite. Mental                         Status Examination: alert and oriented. Airway                         Examination: normal oropharyngeal airway and neck                         mobility. Respiratory Examination: clear to                         auscultation. CV Examination: normal. Prophylactic  Antibiotics: The patient does not require prophylactic                         antibiotics. Prior Anticoagulants: The patient has                         taken no previous anticoagulant or antiplatelet                         agents.  ASA Grade Assessment: II - A patient with mild                         systemic disease. After reviewing the risks and                         benefits, the patient was deemed in satisfactory                         condition to undergo the procedure. The anesthesia                         plan was to use monitored anesthesia care (MAC).                         Immediately prior to administration of medications,                         the patient was re-assessed for adequacy to receive                         sedatives. The heart rate, respiratory rate, oxygen                         saturations, blood pressure, adequacy of pulmonary                         ventilation, and response to care were monitored                         throughout the procedure. The physical status of the                         patient was re-assessed after the procedure.                        After obtaining informed consent, the endoscope was                         passed under direct vision. Throughout the procedure,                         the patient's blood pressure, pulse, and oxygen                         saturations were monitored continuously. The Endoscope                         was introduced through the mouth, and advanced to the  second part of duodenum. The upper GI endoscopy was                         accomplished without difficulty. The patient tolerated                         the procedure well. Findings:      A 6 cm hiatal hernia was present.      The lower third of the esophagus was moderately tortuous.      One benign-appearing, intrinsic mild (non-circumferential scarring)       stenosis was found. The stenosis was traversed. A TTS dilator was passed       through the scope. Dilation with a 15-16.5-18 mm balloon dilator was       performed to 18 mm. The dilation site was examined and showed mild       mucosal disruption. Estimated blood loss was minimal.  Biopsies were       obtained from the proximal and distal esophagus with cold forceps for       histology of suspected eosinophilic esophagitis. Estimated blood loss       was minimal.      The entire examined stomach was normal.      The examined duodenum was normal. Impression:            - 6 cm hiatal hernia.                        - Tortuous esophagus.                        - Benign-appearing esophageal stenosis. Dilated.                         Biopsied.                        - Normal stomach.                        - Normal examined duodenum. Recommendation:        - Discharge patient to home.                        - Resume previous diet.                        - Continue present medications.                        - Await pathology results.                        - Return to referring physician as previously                         scheduled. Procedure Code(s):     --- Professional ---                        [phone], Esophagogastroduodenoscopy, flexible,                         transoral; with transendoscopic balloon dilation of  esophagus (less than 30 mm diameter) Diagnosis Code(s):     --- Professional ---                        K44.9, Diaphragmatic hernia without obstruction or                         gangrene                        Q39.9, Congenital malformation of esophagus,                         unspecified                        K22.2, Esophageal obstruction                        R13.10, Dysphagia, unspecified                        K21.9, Gastro-esophageal reflux disease without                         esophagitis CPT copyright 2019 [employer]. All rights reserved. The codes documented in this report are preliminary and upon coder review may  be revised to meet current compliance requirements. Andrey Farmer MD, MD 02/05/2020 8:24:17 AM Number of Addenda: 0 Note Initiated On: 02/05/2020 7:50 AM Estimated Blood Loss:   Estimated blood loss was minimal.      [facility]

## 2020-02-05 NOTE — H&P (Signed)
Outpatient short stay form Pre-procedure 02/05/2020 7:58 AM Raylene Miyamoto MD, MPH  Primary Physician:  Dr. Brita Romp  Reason for visit:  History of food impaction  History of present illness:   75 y/o lady with history of food impaction here for follow-up EGD. No blood thinners. Barium swallow unremarkable. Smokes 5-6 cigarettes. No new GI symptoms.    Current Facility-Administered Medications:  .  0.9 %  sodium chloride infusion, , Intravenous, Continuous, Xzayvier Fagin, Hilton Cork, MD, Last Rate: 20 mL/hr at 02/05/20 0744, New Bag at 02/05/20 0744  Medications Prior to Admission  Medication Sig Dispense Refill Last Dose  . alendronate (FOSAMAX) 70 MG tablet Take 1 tablet (70 mg total) by mouth once a week. Take with a full glass of water on an empty stomach. 4 tablet 11 Past Week at Unknown time  . amLODipine (NORVASC) 5 MG tablet Take 1 tablet (5 mg total) by mouth daily. 90 tablet 1 02/05/2020 at Unknown time  . aspirin EC 81 MG tablet Take 81 mg daily by mouth.   02/04/2020 at Unknown time  . aspirin-acetaminophen-caffeine (EXCEDRIN MIGRAINE) 250-250-65 MG tablet Take 2 tablets by mouth daily as needed for headache.   Past Week at Unknown time  . ibuprofen (ADVIL) 200 MG tablet Take 200 mg by mouth every 6 (six) hours as needed.   Past Week at Unknown time  . lisinopril (ZESTRIL) 40 MG tablet Take 1 tablet (40 mg total) by mouth daily. 90 tablet 3 02/04/2020 at Unknown time  . Multiple Vitamin (MULTIVITAMIN ADULT PO) Take by mouth daily.   Past Week at Unknown time  . simvastatin (ZOCOR) 40 MG tablet Take 1 tablet (40 mg total) by mouth daily. 90 tablet 3 02/05/2020 at Unknown time  . VITAMIN D PO Take by mouth daily.   Past Week at Unknown time  . ALPRAZolam (XANAX) 0.5 MG tablet TAKE 1/2 TABLET BY MOUTH TWICE DAILY AS NEEDED FOR ANXIETY (Patient taking differently: Take 0.25 mg by mouth 2 (two) times daily as needed for anxiety. ) 30 tablet 5   . sertraline (ZOLOFT) 100 MG tablet Take 1  tablet (100 mg total) by mouth daily. (Patient not taking: Reported on 02/05/2020) 90 tablet 3 Not Taking at Unknown time     No Known Allergies   Past Medical History:  Diagnosis Date  . Anxiety   . Arthritis   . Balance problem   . Depression   . Diverticulitis   . Environmental and seasonal allergies   . GERD (gastroesophageal reflux disease)   . HOH (hard of hearing)   . Hyperlipidemia   . Hypertension   . IBS (irritable bowel syndrome)   . Osteoporosis   . Right sided sciatica   . Vertigo     Review of systems:  Otherwise negative.    Physical Exam  Gen: Alert, oriented. Appears stated age.  HEENT: PERRLA. Lungs: No respiratory distress CV: RRR Abd: soft, benign, no masses Ext: No edema    Planned procedures: Proceed with EGD. The patient understands the nature of the planned procedure, indications, risks, alternatives and potential complications including but not limited to bleeding, infection, perforation, damage to internal organs and possible oversedation/side effects from anesthesia. The patient agrees and gives consent to proceed.  Please refer to procedure notes for findings, recommendations and patient disposition/instructions.     Raylene Miyamoto MD, MPH Gastroenterology 02/05/2020  7:58 AM

## 2020-02-06 ENCOUNTER — Encounter: Payer: Self-pay | Admitting: Gastroenterology

## 2020-02-06 LAB — SURGICAL PATHOLOGY

## 2020-02-12 ENCOUNTER — Other Ambulatory Visit: Payer: Self-pay | Admitting: Physician Assistant

## 2020-03-07 ENCOUNTER — Telehealth: Payer: Self-pay | Admitting: Family Medicine

## 2020-03-07 MED ORDER — LISINOPRIL 40 MG PO TABS: 40.0000 mg | ORAL_TABLET | Freq: Every day | ORAL | 1 refills | Status: DC

## 2020-03-07 NOTE — Telephone Encounter (Signed)
[facility] faxed refill request for the following medications:  lisinopril (ZESTRIL) 40 MG tablet  90 day supply Last Rx: 03/08/19 LOV: 10/18/19 Please advise. Thanks TNP

## 2020-03-31 NOTE — Progress Notes (Unsigned)
Subjective:   Alaysia Lightle is a 75 y.o. female who presents for Medicare Annual (Subsequent) preventive examination.  Review of Systems    N/A  Cardiac Risk Factors include: advanced age (>76men, >81 women);dyslipidemia;hypertension;smoking/ tobacco exposure     Objective:    Today's Vitals   04/01/20 0942  BP: 98/62  Pulse: 87  Temp: 97.6 F (36.4 C)  TempSrc: Oral  SpO2: 97%  Weight: 96 lb (43.5 kg)  Height: 4\' 9"  (1.448 m)  PainSc: 0-No pain   Body mass index is 20.77 kg/m.  Advanced Directives 04/01/2020 02/05/2020 12/21/2019 12/21/2019 03/01/2019 02/01/2019 06/12/2018  Does Patient Have a Medical Advance Directive? No No No No No No No  Would patient like information on creating a medical advance directive? No - Patient declined No - Patient declined - No - Patient declined No - Patient declined No - Patient declined No - Patient declined    Current Medications (verified) Outpatient Encounter Medications as of 04/01/2020  Medication Sig  . alendronate (FOSAMAX) 70 MG tablet Take 1 tablet (70 mg total) by mouth once a week. Take with a full glass of water on an empty stomach.  . ALPRAZolam (XANAX) 0.5 MG tablet TAKE 1/2 TABLET BY MOUTH TWICE DAILY AS NEEDED FOR ANXIETY (Patient taking differently: Take 0.25 mg by mouth 2 (two) times daily as needed for anxiety.)  . amLODipine (NORVASC) 5 MG tablet TAKE 1 TABLET(5 MG) BY MOUTH DAILY  . aspirin EC 81 MG tablet Take 81 mg daily by mouth.  Marland Kitchen aspirin-acetaminophen-caffeine (EXCEDRIN MIGRAINE) 250-250-65 MG tablet Take 2 tablets by mouth daily as needed for headache.  . ibuprofen (ADVIL) 200 MG tablet Take 200 mg by mouth every 6 (six) hours as needed.  Marland Kitchen lisinopril (ZESTRIL) 40 MG tablet Take 1 tablet (40 mg total) by mouth daily.  . Multiple Vitamin (MULTIVITAMIN ADULT PO) Take by mouth daily.  Marland Kitchen omeprazole (PRILOSEC) 20 MG capsule Take 20 mg by mouth daily. Unsure of dose  . simvastatin (ZOCOR) 40 MG tablet Take 1 tablet (40 mg  total) by mouth daily.  Marland Kitchen VITAMIN D PO Take by mouth daily.  . sertraline (ZOLOFT) 100 MG tablet Take 1 tablet (100 mg total) by mouth daily. (Patient not taking: No sig reported)   No facility-administered encounter medications on file as of 04/01/2020.    Allergies (verified) Patient has no known allergies.   History: Past Medical History:  Diagnosis Date  . Anxiety   . Arthritis   . Balance problem   . Depression   . Diverticulitis   . Environmental and seasonal allergies   . GERD (gastroesophageal reflux disease)   . HOH (hard of hearing)   . Hyperlipidemia   . Hypertension   . IBS (irritable bowel syndrome)   . Osteoporosis   . Right sided sciatica   . Vertigo    Past Surgical History:  Procedure Laterality Date  . CATARACT EXTRACTION W/PHACO Right 09/07/2018   Procedure: CATARACT EXTRACTION PHACO AND INTRAOCULAR LENS PLACEMENT;  Surgeon: Marchia Meiers, MD;  Location: [facility] ORS;  Service: Ophthalmology;  Laterality: Right;  [place]: 01:08.2 CDE: 11.02 Fluid Pack Lot # [id number]  . CATARACT EXTRACTION W/PHACO Left 02/01/2019   Procedure: CATARACT EXTRACTION PHACO AND INTRAOCULAR LENS PLACEMENT ([place]) LEFT VISION BLUE;  Surgeon: Marchia Meiers, MD;  Location: [facility] ORS;  Service: Ophthalmology;  Laterality: Left;  Lot #:  [id number] H [place]:  00.51.3 CDE:  6.98  . COLONOSCOPY WITH PROPOFOL N/A 02/27/2018   Procedure: COLONOSCOPY WITH PROPOFOL;  Surgeon: Jonathon Bellows, MD;  Location: [facility] ENDOSCOPY;  Service: Gastroenterology;  Laterality: N/A;  . ESOPHAGOGASTRODUODENOSCOPY N/A 02/05/2020   Procedure: ESOPHAGOGASTRODUODENOSCOPY (EGD);  Surgeon: Lesly Rubenstein, MD;  Location: [facility] ENDOSCOPY;  Service: Endoscopy;  Laterality: N/A;  . ESOPHAGOGASTRODUODENOSCOPY (EGD) WITH PROPOFOL N/A 12/21/2019   Procedure: ESOPHAGOGASTRODUODENOSCOPY (EGD) WITH PROPOFOL;  Surgeon: Lesly Rubenstein, MD;  Location: [facility] ENDOSCOPY;  Service: Endoscopy;  Laterality: N/A;   Family History  Problem Relation Age  of Onset  . Dementia Mother 52  . Hypertension Father   . Hyperlipidemia Father   . Heart attack Father 74  . Pancreatic cancer Brother   . CVA Maternal Grandfather   . Hypothyroidism Daughter        [place]  . Breast cancer Neg Hx   . Colon cancer Neg Hx    Social History   Socioeconomic History  . Marital status: Divorced    Spouse name: Not on file  . Number of children: 2  . Years of education: Not on file  . Highest education level: Some college, no degree  Occupational History  . Occupation: retired  Tobacco Use  . Smoking status: Current Some Day Smoker    Packs/day: 0.25    Years: 54.00    Pack years: 13.50    Types: Cigarettes    Last attempt to quit: 02/08/2018    Years since quitting: 2.1  . Smokeless tobacco: Never Used  . Tobacco comment: smokes 3 cigarettes out of a pack then throws away  Vaping Use  . Vaping Use: Never used  Substance and Sexual Activity  . Alcohol use: Yes    Alcohol/week: 3.0 standard drinks    Types: 3 Glasses of wine per week    Comment: 1 glass 3 times a week  . Drug use: Never  . Sexual activity: Never  Other Topics Concern  . Not on file  Social History Narrative  . Not on file   Social Determinants of Health   Financial Resource Strain: Low Risk   . Difficulty of Paying Living Expenses: Not hard at all  Food Insecurity: No Food Insecurity  . Worried About Charity fundraiser in the Last Year: Never true  . Ran Out of Food in the Last Year: Never true  Transportation Needs: No Transportation Needs  . Lack of Transportation (Medical): No  . Lack of Transportation (Non-Medical): No  Physical Activity: Inactive  . Days of Exercise per Week: 0 days  . Minutes of Exercise per Session: 0 min  Stress: Stress Concern Present  . Feeling of Stress : To some extent  Social Connections: Socially Isolated  . Frequency of Communication with Friends and Family: More than three times a week  . Frequency of Social Gatherings with  Friends and Family: More than three times a week  . Attends Religious Services: Never  . Active Member of Clubs or Organizations: No  . Attends Archivist Meetings: Never  . Marital Status: Widowed    Tobacco Counseling Ready to quit: Not Answered Counseling given: Not Answered Comment: smokes 3 cigarettes out of a pack then throws away   Clinical Intake:  Pre-visit preparation completed: Yes  Pain : No/denies pain Pain Score: 0-No pain     Nutritional Status: BMI of 19-24  Normal Nutritional Risks: None Diabetes: No  How often do you need to have someone help you when you read instructions, pamphlets, or other written materials from your doctor or pharmacy?: 1 - Never  Diabetic?  No  Interpreter Needed?: No  Information entered by :: [facility], LPN   Activities of Daily Living In your present state of health, do you have any difficulty performing the following activities: 04/01/2020 10/19/2019  Hearing? Y N  Comment Does not wears hearing aids. -  Vision? N N  Difficulty concentrating or making decisions? N N  Walking or climbing stairs? N N  Dressing or bathing? N N  Doing errands, shopping? N N  Preparing Food and eating ? N -  Using the Toilet? N -  In the past six months, have you accidently leaked urine? Y -  Comment Occasionally due to waiting to empty bladder. -  Do you have problems with loss of bowel control? N -  Managing your Medications? N -  Managing your Finances? N -  Housekeeping or managing your Housekeeping? N -  Some recent data might be hidden    Patient Care Team: Bacigalupo, [name], MD as PCP - General (Family Medicine) [place], [place] (Optometry) Haig Prophet, Hilton Cork, MD as Consulting Physician (Gastroenterology)  Indicate any recent Medical Services you may have received from other than [facility] providers in the past year (date may be approximate).     Assessment:   This is a routine wellness examination for  [facility].  Hearing/Vision screen No exam data present  Dietary issues and exercise activities discussed: Current Exercise Habits: Home exercise routine, Type of exercise: yoga;stretching, Time (Minutes): 10, Frequency (Times/Week): 3, Weekly Exercise (Minutes/Week): 30, Intensity: Mild, Exercise limited by: None identified  Goals    . Quit Smoking     Recommend to continue efforts to reduce smoking habits until no longer smoking (Smoking Cessation literature attached to AVS).        Depression Screen PHQ 2/9 Scores 04/01/2020 10/19/2019 03/08/2019 03/01/2019 03/01/2019 03/09/2018 12/07/2017  PHQ - 2 Score 0 1 0 0 0 1 1  PHQ- 9 Score - 2 4 - - 6 -    Fall Risk Fall Risk  04/01/2020 10/19/2019 03/01/2019 12/07/2017 12/01/2016  Falls in the past year? 0 0 0 1 No  Number falls in past yr: 0 0 0 1 -  Injury with Fall? 0 0 0 0 -  Risk for fall due to : - No Fall Risks - Impaired balance/gait -  Risk for fall due to: Comment - - - Pt needs a PT evaluation due to imparied gait.  -  Follow up - Falls evaluation completed - - -    FALL RISK PREVENTION PERTAINING TO THE HOME:  Any stairs in or around the home? No  If so, are there any without handrails? No  Home free of loose throw rugs in walkways, pet beds, electrical cords, etc? Yes  Adequate lighting in your home to reduce risk of falls? Yes   ASSISTIVE DEVICES UTILIZED TO PREVENT FALLS:  Life alert? No  Use of a cane, walker or w/c? No  Grab bars in the bathroom? Yes  Shower chair or bench in shower? No  Elevated toilet seat or a handicapped toilet? No   Cognitive Function: Normal cognitive status assessed by observation by this Nurse Health Advisor. No abnormalities found.      6CIT Screen 03/01/2019 12/07/2017  What Year? 0 points 0 points  What month? 0 points 0 points  What time? 0 points 0 points  Count back from 20 0 points 0 points  Months in reverse 0 points 0 points  Repeat phrase 0 points 2 points  Total Score  0 2     Immunizations Immunization History  Administered Date(s) Administered  . Fluad Quad(high Dose 65+) 10/18/2019  . Influenza, High Dose Seasonal PF 12/01/2016, 12/07/2017  . Influenza,inj,Quad PF,6+ Mos 11/26/2018  . PFIZER(Purple Top)SARS-COV-2 Vaccination 03/09/2019, 04/03/2019, 10/25/2019  . Pneumococcal Conjugate-13 12/01/2016  . Pneumococcal Polysaccharide-23 12/07/2017    TDAP status: Due, Education has been provided regarding the importance of this vaccine. Advised may receive this vaccine at local pharmacy or Health Dept. Aware to provide a copy of the vaccination record if obtained from local pharmacy or Health Dept. Verbalized acceptance and understanding.  Flu Vaccine status: Up to date  Pneumococcal vaccine status: Up to date  Covid-19 vaccine status: Completed vaccines  Qualifies for Shingles Vaccine? Yes   Zostavax completed No   Shingrix Completed?: No.    Education has been provided regarding the importance of this vaccine. Patient has been advised to call insurance company to determine out of pocket expense if they have not yet received this vaccine. Advised may also receive vaccine at local pharmacy or Health Dept. Verbalized acceptance and understanding.  Screening Tests Health Maintenance  Topic Date Due  . DEXA SCAN  03/07/2020  . TETANUS/TDAP  12/08/2022 (Originally 09/04/1964)  . COLONOSCOPY (Pts 45-43yrs Insurance coverage will need to be confirmed)  02/28/2023  . INFLUENZA VACCINE  Completed  . COVID-19 Vaccine  Completed  . Hepatitis C Screening  Completed  . PNA vac Low Risk Adult  Completed  . HPV VACCINES  Aged Out    Health Maintenance  Health Maintenance Due  Topic Date Due  . DEXA SCAN  03/07/2020    Colorectal cancer screening: Type of screening: Colonoscopy. Completed 02/27/18. Repeat every 5 years  Mammogram status: No longer required due to age.  Bone Density status: Ordered today. Pt provided with contact info and advised to call to  schedule appt.  Lung Cancer Screening: (Low Dose CT Chest recommended if Age 40-80 years, 30 pack-year currently smoking OR have quit w/in 15years.) does qualify.   Additional Screening:  Hepatitis C Screening: Up to date  Vision Screening: Recommended annual ophthalmology exams for early detection of glaucoma and other disorders of the eye. Is the patient up to date with their annual eye exam?  Yes  Who is the provider or what is the name of the office in which the patient attends annual eye exams? [facility] If pt is not established with a provider, would they like to be referred to a provider to establish care? No .   Dental Screening: Recommended annual dental exams for proper oral hygiene  Community Resource Referral / Chronic Care Management: CRR required this visit?  No   CCM required this visit?  No      Plan:     I have personally reviewed and noted the following in the patient's chart:   . Medical and social history . Use of alcohol, tobacco or illicit drugs  . Current medications and supplements . Functional ability and status . Nutritional status . Physical activity . Advanced directives . List of other physicians . Hospitalizations, surgeries, and ER visits in previous 12 months . Vitals . Screenings to include cognitive, depression, and falls . Referrals and appointments  In addition, I have reviewed and discussed with patient certain preventive protocols, quality metrics, and best practice recommendations. A written personalized care plan for preventive services as well as general preventive health recommendations were provided to patient.     McKenzie Hassan Buckler, LPN  04/01/2020   Nurse Notes: None.

## 2020-04-01 ENCOUNTER — Ambulatory Visit (INDEPENDENT_AMBULATORY_CARE_PROVIDER_SITE_OTHER): Payer: Medicare Other | Admitting: Family Medicine

## 2020-04-01 ENCOUNTER — Ambulatory Visit (INDEPENDENT_AMBULATORY_CARE_PROVIDER_SITE_OTHER): Payer: Medicare Other

## 2020-04-01 ENCOUNTER — Other Ambulatory Visit: Payer: Self-pay

## 2020-04-01 ENCOUNTER — Encounter: Payer: Self-pay | Admitting: Family Medicine

## 2020-04-01 VITALS — BP 98/62 | HR 87 | Temp 97.6°F | Ht <= 58 in | Wt 96.0 lb

## 2020-04-01 VITALS — BP 98/62 | HR 87 | Temp 97.6°F | Resp 16 | Ht <= 58 in | Wt 96.0 lb

## 2020-04-01 DIAGNOSIS — M810 Age-related osteoporosis without current pathological fracture: Secondary | ICD-10-CM

## 2020-04-01 DIAGNOSIS — Z5320 Procedure and treatment not carried out because of patient's decision for unspecified reasons: Secondary | ICD-10-CM

## 2020-04-01 DIAGNOSIS — F411 Generalized anxiety disorder: Secondary | ICD-10-CM | POA: Diagnosis not present

## 2020-04-01 DIAGNOSIS — I10 Essential (primary) hypertension: Secondary | ICD-10-CM

## 2020-04-01 DIAGNOSIS — E782 Mixed hyperlipidemia: Secondary | ICD-10-CM

## 2020-04-01 DIAGNOSIS — Z720 Tobacco use: Secondary | ICD-10-CM | POA: Diagnosis not present

## 2020-04-01 DIAGNOSIS — Z0000 Encounter for general adult medical examination without abnormal findings: Secondary | ICD-10-CM

## 2020-04-01 MED ORDER — LISINOPRIL 40 MG PO TABS: 40.0000 mg | ORAL_TABLET | Freq: Every day | ORAL | 1 refills | Status: DC

## 2020-04-01 MED ORDER — SIMVASTATIN 40 MG PO TABS: 40.0000 mg | ORAL_TABLET | Freq: Every day | ORAL | 3 refills | Status: DC

## 2020-04-01 MED ORDER — ALENDRONATE SODIUM 70 MG PO TABS: 70.0000 mg | ORAL_TABLET | ORAL | 11 refills | Status: DC

## 2020-04-01 MED ORDER — AMLODIPINE BESYLATE 5 MG PO TABS: ORAL_TABLET | ORAL | 3 refills | Status: DC

## 2020-04-01 MED ORDER — ALPRAZOLAM 0.5 MG PO TABS: ORAL_TABLET | ORAL | 5 refills | Status: DC

## 2020-04-01 NOTE — Assessment & Plan Note (Signed)
Previously well controlled Continue statin Repeat FLP and CMP  

## 2020-04-01 NOTE — Assessment & Plan Note (Signed)
Chronic and well controlled Did not tolerate Zoloft Continue low dose xanax prn - discussed risks of chronic benzos in the elderly Encourage therapy

## 2020-04-01 NOTE — Assessment & Plan Note (Signed)
Well controlled Continue current medications Recheck metabolic panel F/u in 6 months  

## 2020-04-01 NOTE — Assessment & Plan Note (Signed)
Continue to encourage cessation. 

## 2020-04-01 NOTE — Patient Instructions (Signed)
Preventive Care 76 Years and Older, Female Preventive care refers to lifestyle choices and visits with your health care provider that can promote health and wellness. This includes:  A yearly physical exam. This is also called an annual wellness visit.  Regular dental and eye exams.  Immunizations.  Screening for certain conditions.  Healthy lifestyle choices, such as: ? Eating a healthy diet. ? Getting regular exercise. ? Not using drugs or products that contain nicotine and tobacco. ? Limiting alcohol use. What can I expect for my preventive care visit? Physical exam Your health care provider will check your:  Height and weight. These may be used to calculate your BMI (body mass index). BMI is a measurement that tells if you are at a healthy weight.  Heart rate and blood pressure.  Body temperature.  Skin for abnormal spots. Counseling Your health care provider may ask you questions about your:  Past medical problems.  Family's medical history.  Alcohol, tobacco, and drug use.  Emotional well-being.  Home life and relationship well-being.  Sexual activity.  Diet, exercise, and sleep habits.  History of falls.  Memory and ability to understand (cognition).  Work and work Statistician.  Pregnancy and menstrual history.  Access to firearms. What immunizations do I need? Vaccines are usually given at various ages, according to a schedule. Your health care provider will recommend vaccines for you based on your age, medical history, and lifestyle or other factors, such as travel or where you work.   What tests do I need? Blood tests  Lipid and cholesterol levels. These may be checked every 5 years, or more often depending on your overall health.  Hepatitis C test.  Hepatitis B test. Screening  Lung cancer screening. You may have this screening every year starting at age 76 if you have a 30-pack-year history of smoking and currently smoke or have quit within  the past 15 years.  Colorectal cancer screening. ? All adults should have this screening starting at age 44 and continuing until age 58. ? Your health care provider may recommend screening at age 2 if you are at increased risk. ? You will have tests every 1-10 years, depending on your results and the type of screening test.  Diabetes screening. ? This is done by checking your blood sugar (glucose) after you have not eaten for a while (fasting). ? You may have this done every 1-3 years.  Mammogram. ? This may be done every 1-2 years. ? Talk with your health care provider about how often you should have regular mammograms.  Abdominal aortic aneurysm (AAA) screening. You may need this if you are a current or former smoker.  BRCA-related cancer screening. This may be done if you have a family history of breast, ovarian, tubal, or peritoneal cancers. Other tests  STD (sexually transmitted disease) testing, if you are at risk.  Bone density scan. This is done to screen for osteoporosis. You may have this done starting at age 76. Talk with your health care provider about your test results, treatment options, and if necessary, the need for more tests. Follow these instructions at home: Eating and drinking  Eat a diet that includes fresh fruits and vegetables, whole grains, lean protein, and low-fat dairy products. Limit your intake of foods with high amounts of sugar, saturated fats, and salt.  Take vitamin and mineral supplements as recommended by your health care provider.  Do not drink alcohol if your health care provider tells you not to drink.  If you drink alcohol: ? Limit how much you have to 0-1 drink a day. ? Be aware of how much alcohol is in your drink. In the [place] one drink equals one 12 oz bottle of beer (355 mL), one 5 oz glass of wine (148 mL), or one 1 oz glass of hard liquor (44 mL).   Lifestyle  Take daily care of your teeth and gums. Brush your teeth every morning  and night with fluoride toothpaste. Floss one time each day.  Stay active. Exercise for at least 30 minutes 5 or more days each week.  Do not use any products that contain nicotine or tobacco, such as cigarettes, e-cigarettes, and chewing tobacco. If you need help quitting, ask your health care provider.  Do not use drugs.  If you are sexually active, practice safe sex. Use a condom or other form of protection in order to prevent STIs (sexually transmitted infections).  Talk with your health care provider about taking a low-dose aspirin or statin.  Find healthy ways to cope with stress, such as: ? Meditation, yoga, or listening to music. ? Journaling. ? Talking to a trusted person. ? Spending time with friends and family. Safety  Always wear your seat belt while driving or riding in a vehicle.  Do not drive: ? If you have been drinking alcohol. Do not ride with someone who has been drinking. ? When you are tired or distracted. ? While texting.  Wear a helmet and other protective equipment during sports activities.  If you have firearms in your house, make sure you follow all gun safety procedures. What's next?  Visit your health care provider once a year for an annual wellness visit.  Ask your health care provider how often you should have your eyes and teeth checked.  Stay up to date on all vaccines. This information is not intended to replace advice given to you by your health care provider. Make sure you discuss any questions you have with your health care provider. Document Revised: 01/02/2020 Document Reviewed: 01/05/2018 Elsevier Patient Education  2021 [employer]  

## 2020-04-01 NOTE — Progress Notes (Signed)
Established patient visit   Patient: Amanda Tucker   DOB: 1945/11/14   75 y.o. Female  MRN: [id number] Visit Date: 04/01/2020  Today's healthcare provider: [name], MD   Chief Complaint  Patient presents with  . Hypertension  . Hyperlipidemia   Subjective    HPI  04/01/2020 AWV  02/27/2018 Colonoscopy-polyp, diverticulosis 03/07/2018 BMD-Osteoporosis Hypertension, follow-up  BP Readings from Last 3 Encounters:  04/01/20 98/62  04/01/20 98/62  02/05/20 116/72   Wt Readings from Last 3 Encounters:  04/01/20 96 lb (43.5 kg)  04/01/20 96 lb (43.5 kg)  02/05/20 90 lb (40.8 kg)     She was last seen for hypertension 6 months ago.  BP at that visit was 111/78 . Management since that visit includes no changes.  She reports excellent compliance with treatment. She is not having side effects.  She is following a Regular diet. She is exercising. She does smoke.  Use of agents associated with hypertension: none.   Outside blood pressures are not being checked. Symptoms: No chest pain No chest pressure  No palpitations No syncope  No dyspnea No orthopnea  No paroxysmal nocturnal dyspnea No lower extremity edema   Pertinent labs: Lab Results  Component Value Date   CHOL 164 10/18/2019   HDL 58 10/18/2019   LDLCALC 86 10/18/2019   TRIG 111 10/18/2019   CHOLHDL 2.8 10/18/2019   Lab Results  Component Value Date   NA 139 12/21/2019   K 4.2 12/21/2019   CREATININE 0.72 12/21/2019   GFRNONAA >60 12/21/2019   GFRAA 108 10/18/2019   GLUCOSE 104 (H) 12/21/2019     The 10-year ASCVD risk score Mikey Bussing DC Jr., et al., 2013) is: 18.1%   --------------------------------------------------------------------------------------------------- Lipid/Cholesterol, Follow-up  Last lipid panel Other pertinent labs  Lab Results  Component Value Date   CHOL 164 10/18/2019   HDL 58 10/18/2019   LDLCALC 86 10/18/2019   TRIG 111 10/18/2019   CHOLHDL 2.8 10/18/2019    Lab Results  Component Value Date   ALT 17 10/18/2019   AST 20 10/18/2019   PLT 215 12/21/2019   TSH 0.71 12/14/2016     She was last seen for this 5 months ago.  Management since that visit includes no changes.  She reports excellent compliance with treatment. She is not having side effects.   Symptoms: No chest pain No chest pressure/discomfort  No dyspnea No lower extremity edema  No numbness or tingling of extremity No orthopnea  No palpitations No paroxysmal nocturnal dyspnea  No speech difficulty No syncope   Current diet: in general, a "healthy" diet   Current exercise: housecleaning and walking  The 10-year ASCVD risk score Mikey Bussing DC Jr., et al., 2013) is: 18.1%  --------------------------------------------------------------------------------------------------- Depression, Follow-up  She  was last seen for this 6 months ago. Changes made at last visit include no changes.   Patient reports she stopped taking sertraline in December, 2021 She is not having side effects.   She reports excellent tolerance of treatment. Current symptoms include: patient denies any symptoms She feels she is Improved since last visit.  Depression screen [facility] 2/9 04/01/2020 04/01/2020 10/19/2019  Decreased Interest 0 0 1  Down, Depressed, Hopeless 0 0 0  PHQ - 2 Score 0 0 1  Altered sleeping 0 - 1  Tired, decreased energy 1 - 0  Change in appetite 0 - 0  Feeling bad or failure about yourself  0 - 0  Trouble concentrating 0 - -  Moving slowly or fidgety/restless 0 - 0  Suicidal thoughts 0 - 0  PHQ-9 Score 1 - 2  Difficult doing work/chores Not difficult at all - Not difficult at all  Some recent data might be hidden    ----------------------------------------------------------------------------------------- Anxiety, Follow-up  She was last seen for anxiety 6 months ago. Changes made at last visit include no changes.   She reports excellent compliance with treatment. She reports  excellent tolerance of treatment. She is not having side effects.   She feels her anxiety is mild and Unchanged since last visit.  Symptoms: No chest pain No difficulty concentrating  No dizziness No fatigue  No feelings of losing control Yes insomnia  No irritable No palpitations  No panic attacks Yes racing thoughts  No shortness of breath No sweating  No tremors/shakes    GAD-7 Results GAD-7 Generalized Anxiety Disorder Screening Tool 04/01/2020 03/08/2019 03/09/2018  1. Feeling Nervous, Anxious, or on Edge 1 2 3   2. Not Being Able to Stop or Control Worrying 2 2 2   3. Worrying Too Much About Different Things 1 3 3   4. Trouble Relaxing 1 2 3   5. Being So Restless it's Hard To Sit Still 0 1 0  6. Becoming Easily Annoyed or Irritable 0 0 0  7. Feeling Afraid As If Something Awful Might Happen 2 1 3   Total GAD-7 Score 7 11 14   Difficulty At Work, Home, or Getting  Along With Others? Not difficult at all Not difficult at all Somewhat difficult    PHQ-9 Scores PHQ9 SCORE ONLY 04/01/2020 04/01/2020 10/19/2019  PHQ-9 Total Score 1 0 2   Stopped Zoloft because she felt like she didn't need it any longer and she didn't get a refill.  Feels like she can feel again since being off of it. Feels like she was numb on it.Anxiety has not changes since stopping.  ---------------------------------------------------------------------------------------------------   Patient Active Problem List   Diagnosis Date Noted  . IBS (irritable bowel syndrome) 03/09/2018  . Hypokalemia 03/09/2018  . Age-related osteoporosis without current pathological fracture 03/09/2018  . Baker's cyst of knee, right 06/06/2017  . Insomnia 03/07/2017  . Tobacco abuse 12/01/2016  . Hypertension   . Hyperlipidemia   . MDD (major depressive disorder)   . Generalized anxiety disorder    Past Surgical History:  Procedure Laterality Date  . CATARACT EXTRACTION W/PHACO Right 09/07/2018   Procedure: CATARACT EXTRACTION PHACO  AND INTRAOCULAR LENS PLACEMENT;  Surgeon: Marchia Meiers, MD;  Location: [facility] ORS;  Service: Ophthalmology;  Laterality: Right;  [place]: 01:08.2 CDE: 11.02 Fluid Pack Lot # [id number]  . CATARACT EXTRACTION W/PHACO Left 02/01/2019   Procedure: CATARACT EXTRACTION PHACO AND INTRAOCULAR LENS PLACEMENT ([place]) LEFT VISION BLUE;  Surgeon: Marchia Meiers, MD;  Location: [facility] ORS;  Service: Ophthalmology;  Laterality: Left;  Lot #:  [id number] [place]:  00.51.3 CDE:  6.98  . COLONOSCOPY WITH PROPOFOL N/A 02/27/2018   Procedure: COLONOSCOPY WITH PROPOFOL;  Surgeon: Jonathon Bellows, MD;  Location[facility] ENDOSCOPY;  Service: Gastroenterology;  Laterality: N/A;  . ESOPHAGOGASTRODUODENOSCOPY N/A 02/05/2020   Procedure: ESOPHAGOGASTRODUODENOSCOPY (EGD);  Surgeon: Lesly Rubenstein, MD;  Location: [facility] ENDOSCOPY;  Service: Endoscopy;  Laterality: N/A;  . ESOPHAGOGASTRODUODENOSCOPY (EGD) WITH PROPOFOL N/A 12/21/2019   Procedure: ESOPHAGOGASTRODUODENOSCOPY (EGD) WITH PROPOFOL;  Surgeon: Lesly Rubenstein, MD;  Location: [facility] ENDOSCOPY;  Service: Endoscopy;  Laterality: N/A;   Social History   Socioeconomic History  . Marital status: Divorced    Spouse name: Not on file  .  Number of children: 2  . Years of education: Not on file  . Highest education level: Some college, no degree  Occupational History  . Occupation: retired  Tobacco Use  . Smoking status: Current Some Day Smoker    Packs/day: 0.25    Years: 54.00    Pack years: 13.50    Types: Cigarettes    Last attempt to quit: 02/08/2018    Years since quitting: 2.1  . Smokeless tobacco: Never Used  . Tobacco comment: smokes 3 cigarettes out of a pack then throws away  Vaping Use  . Vaping Use: Never used  Substance and Sexual Activity  . Alcohol use: Yes    Alcohol/week: 3.0 standard drinks    Types: 3 Glasses of wine per week    Comment: 1 glass 3 times a week  . Drug use: Never  . Sexual activity: Never  Other Topics Concern  . Not on file  Social History  Narrative  . Not on file   Social Determinants of Health   Financial Resource Strain: Low Risk   . Difficulty of Paying Living Expenses: Not hard at all  Food Insecurity: No Food Insecurity  . Worried About Charity fundraiser in the Last Year: Never true  . Ran Out of Food in the Last Year: Never true  Transportation Needs: No Transportation Needs  . Lack of Transportation (Medical): No  . Lack of Transportation (Non-Medical): No  Physical Activity: Inactive  . Days of Exercise per Week: 0 days  . Minutes of Exercise per Session: 0 min  Stress: Stress Concern Present  . Feeling of Stress : To some extent  Social Connections: Socially Isolated  . Frequency of Communication with Friends and Family: More than three times a week  . Frequency of Social Gatherings with Friends and Family: More than three times a week  . Attends Religious Services: Never  . Active Member of Clubs or Organizations: No  . Attends Archivist Meetings: Never  . Marital Status: Widowed  Intimate Partner Violence: Not At Risk  . Fear of Current or Ex-Partner: No  . Emotionally Abused: No  . Physically Abused: No  . Sexually Abused: No   Family History  Problem Relation Age of Onset  . Dementia Mother 29  . Hypertension Father   . Hyperlipidemia Father   . Heart attack Father 41  . Pancreatic cancer Brother   . CVA Maternal Grandfather   . Hypothyroidism Daughter        [place]  . Breast cancer Neg Hx   . Colon cancer Neg Hx    No Known Allergies     Medications: Outpatient Medications Prior to Visit  Medication Sig  . alendronate (FOSAMAX) 70 MG tablet Take 1 tablet (70 mg total) by mouth once a week. Take with a full glass of water on an empty stomach.  . ALPRAZolam (XANAX) 0.5 MG tablet TAKE 1/2 TABLET BY MOUTH TWICE DAILY AS NEEDED FOR ANXIETY (Patient taking differently: Take 0.25 mg by mouth 2 (two) times daily as needed for anxiety.)  . amLODipine (NORVASC) 5 MG tablet TAKE  1 TABLET(5 MG) BY MOUTH DAILY  . aspirin EC 81 MG tablet Take 81 mg daily by mouth.  Marland Kitchen aspirin-acetaminophen-caffeine (EXCEDRIN MIGRAINE) 250-250-65 MG tablet Take 2 tablets by mouth daily as needed for headache.  . ibuprofen (ADVIL) 200 MG tablet Take 200 mg by mouth every 6 (six) hours as needed.  Marland Kitchen lisinopril (ZESTRIL) 40 MG tablet Take 1  tablet (40 mg total) by mouth daily.  . Multiple Vitamin (MULTIVITAMIN ADULT PO) Take by mouth daily.  Marland Kitchen omeprazole (PRILOSEC) 20 MG capsule Take 20 mg by mouth daily. Unsure of dose  . simvastatin (ZOCOR) 40 MG tablet Take 1 tablet (40 mg total) by mouth daily.  Marland Kitchen VITAMIN D PO Take by mouth daily.  . sertraline (ZOLOFT) 100 MG tablet Take 1 tablet (100 mg total) by mouth daily. (Patient not taking: No sig reported)   No facility-administered medications prior to visit.    Review of Systems  Constitutional: Negative.   HENT: Negative.   Eyes: Negative.   Respiratory: Negative.   Cardiovascular: Negative.   Gastrointestinal: Negative.   Endocrine: Negative.   Genitourinary: Negative.   Musculoskeletal: Negative.   Skin: Negative.   Allergic/Immunologic: Negative.   Neurological: Negative.   Hematological: Negative.   Psychiatric/Behavioral: Negative.     Last CBC Lab Results  Component Value Date   WBC 7.8 12/21/2019   HGB 14.0 12/21/2019   HCT 41.8 12/21/2019   MCV 93.3 12/21/2019   MCH 31.3 12/21/2019   RDW 13.9 12/21/2019   PLT 215 44/01/270   Last metabolic panel Lab Results  Component Value Date   GLUCOSE 104 (H) 12/21/2019   NA 139 12/21/2019   K 4.2 12/21/2019   CL 103 12/21/2019   CO2 23 12/21/2019   BUN 16 12/21/2019   CREATININE 0.72 12/21/2019   GFRNONAA >60 12/21/2019   GFRAA 108 10/18/2019   CALCIUM 9.2 12/21/2019   PROT 6.3 10/18/2019   ALBUMIN 4.0 10/18/2019   LABGLOB 2.3 10/18/2019   AGRATIO 1.7 10/18/2019   BILITOT 0.3 10/18/2019   ALKPHOS 61 10/18/2019   AST 20 10/18/2019   ALT 17 10/18/2019    ANIONGAP 13 12/21/2019   Last lipids Lab Results  Component Value Date   CHOL 164 10/18/2019   HDL 58 10/18/2019   LDLCALC 86 10/18/2019   TRIG 111 10/18/2019   CHOLHDL 2.8 10/18/2019   Last thyroid functions Lab Results  Component Value Date   TSH 0.71 12/14/2016   Last vitamin D Lab Results  Component Value Date   VD25OH 49.1 06/05/2016        Objective    BP 98/62 (BP Location: Right Arm, Patient Position: Sitting, Cuff Size: Normal)   Pulse 87   Temp 97.6 F (36.4 C) (Oral)   Resp 16   Ht 4\' 9"  (1.448 m)   Wt 96 lb (43.5 kg)   SpO2 97%   BMI 20.77 kg/m  BP Readings from Last 3 Encounters:  04/01/20 98/62  04/01/20 98/62  02/05/20 116/72   Wt Readings from Last 3 Encounters:  04/01/20 96 lb (43.5 kg)  04/01/20 96 lb (43.5 kg)  02/05/20 90 lb (40.8 kg)      Physical Exam Vitals reviewed.  Constitutional:      General: She is not in acute distress.    Appearance: Normal appearance. She is well-developed. She is not diaphoretic.  HENT:     Head: Normocephalic and atraumatic.     Right Ear: Tympanic membrane, ear canal and external ear normal.     Left Ear: Tympanic membrane, ear canal and external ear normal.  Eyes:     General: No scleral icterus.    Conjunctiva/sclera: Conjunctivae normal.     Pupils: Pupils are equal, round, and reactive to light.  Neck:     Thyroid: No thyromegaly.  Cardiovascular:     Rate and Rhythm: Normal rate and  regular rhythm.     Pulses: Normal pulses.     Heart sounds: Normal heart sounds. No murmur heard.   Pulmonary:     Effort: Pulmonary effort is normal. No respiratory distress.     Breath sounds: Normal breath sounds. No wheezing or rales.  Abdominal:     General: There is no distension.     Palpations: Abdomen is soft.     Tenderness: There is no abdominal tenderness.  Musculoskeletal:        General: No deformity.     Cervical back: Neck supple.     Right lower leg: No edema.     Left lower leg: No  edema.  Lymphadenopathy:     Cervical: No cervical adenopathy.  Skin:    General: Skin is warm and dry.     Findings: No rash.  Neurological:     Mental Status: She is alert and oriented to person, place, and time. Mental status is at baseline.     Gait: Gait normal.  Psychiatric:        Mood and Affect: Mood normal.        Behavior: Behavior normal.        Thought Content: Thought content normal.       No results found for any visits on 04/01/20.  Assessment & Plan     Problem List Items Addressed This Visit      Cardiovascular and Mediastinum   Hypertension - Primary    Well controlled Continue current medications Recheck metabolic panel F/u in 6 months       Relevant Medications   amLODipine (NORVASC) 5 MG tablet   lisinopril (ZESTRIL) 40 MG tablet   simvastatin (ZOCOR) 40 MG tablet   Other Relevant Orders   Comprehensive metabolic panel     Musculoskeletal and Integument   Age-related osteoporosis without current pathological fracture    Continue fosamax - tolerating well Check Vit D Recheck DEXA      Relevant Medications   alendronate (FOSAMAX) 70 MG tablet   Other Relevant Orders   VITAMIN D 25 Hydroxy (Vit-D Deficiency, Fractures)     Other   Hyperlipidemia    Previously well controlled Continue statin Repeat FLP and CMP      Relevant Medications   amLODipine (NORVASC) 5 MG tablet   lisinopril (ZESTRIL) 40 MG tablet   simvastatin (ZOCOR) 40 MG tablet   Other Relevant Orders   Comprehensive metabolic panel   Lipid Panel With LDL/HDL Ratio   Generalized anxiety disorder    Chronic and well controlled Did not tolerate Zoloft Continue low dose xanax prn - discussed risks of chronic benzos in the elderly Encourage therapy      Relevant Medications   ALPRAZolam (XANAX) 0.5 MG tablet   Tobacco abuse    Continue to encourage cessation       Other Visit Diagnoses    Mammogram declined          Return in about 6 months (around 10/02/2020)  for chronic disease f/u.      I, [name], MD, have reviewed all documentation for this visit. The documentation on 04/01/20 for the exam, diagnosis, procedures, and orders are all accurate and complete.   Bacigalupo, [name], MD, MPH [facility]

## 2020-04-01 NOTE — Patient Instructions (Signed)
Amanda Tucker , Thank you for taking time to come for your Medicare Wellness Visit. I appreciate your ongoing commitment to your health goals. Please review the following plan we discussed and let me know if I can assist you in the future.   Screening recommendations/referrals: Colonoscopy: Up to date, due 02/2023 Mammogram: No longer required.  Bone Density: Ordered today. Pt provided with contact info and advised to call to schedule appt. Recommended yearly ophthalmology/optometry visit for glaucoma screening and checkup Recommended yearly dental visit for hygiene and checkup  Vaccinations: Influenza vaccine: Done 10/18/19 Pneumococcal vaccine: Completed series Tdap vaccine: Currently due, declined today.  Shingles vaccine: Shingrix discussed. Please contact your pharmacy for coverage information.     Advanced directives: Advance directive discussed with you today. Even though you declined this today please call our office should you change your mind and we can give you the proper paperwork for you to fill out.  Conditions/risks identified: Smoking cessation discussed today.   Next appointment: 10:20 AM today with Dr Brita Romp   Preventive Care 76 Years and Older, Female Preventive care refers to lifestyle choices and visits with your health care provider that can promote health and wellness. What does preventive care include?  A yearly physical exam. This is also called an annual well check.  Dental exams once or twice a year.  Routine eye exams. Ask your health care provider how often you should have your eyes checked.  Personal lifestyle choices, including:  Daily care of your teeth and gums.  Regular physical activity.  Eating a healthy diet.  Avoiding tobacco and drug use.  Limiting alcohol use.  Practicing safe sex.  Taking low-dose aspirin every day.  Taking vitamin and mineral supplements as recommended by your health care provider. What happens during an  annual well check? The services and screenings done by your health care provider during your annual well check will depend on your age, overall health, lifestyle risk factors, and family history of disease. Counseling  Your health care provider may ask you questions about your:  Alcohol use.  Tobacco use.  Drug use.  Emotional well-being.  Home and relationship well-being.  Sexual activity.  Eating habits.  History of falls.  Memory and ability to understand (cognition).  Work and work Statistician.  Reproductive health. Screening  You may have the following tests or measurements:  Height, weight, and BMI.  Blood pressure.  Lipid and cholesterol levels. These may be checked every 5 years, or more frequently if you are over 46 years old.  Skin check.  Lung cancer screening. You may have this screening every year starting at age 75 if you have a 30-pack-year history of smoking and currently smoke or have quit within the past 15 years.  Fecal occult blood test (FOBT) of the stool. You may have this test every year starting at age 73.  Flexible sigmoidoscopy or colonoscopy. You may have a sigmoidoscopy every 5 years or a colonoscopy every 10 years starting at age 37.  Hepatitis C blood test.  Hepatitis B blood test.  Sexually transmitted disease (STD) testing.  Diabetes screening. This is done by checking your blood sugar (glucose) after you have not eaten for a while (fasting). You may have this done every 1-3 years.  Bone density scan. This is done to screen for osteoporosis. You may have this done starting at age 65.  Mammogram. This may be done every 1-2 years. Talk to your health care provider about how often you should have  regular mammograms. Talk with your health care provider about your test results, treatment options, and if necessary, the need for more tests. Vaccines  Your health care provider may recommend certain vaccines, such as:  Influenza  vaccine. This is recommended every year.  Tetanus, diphtheria, and acellular pertussis (Tdap, Td) vaccine. You may need a Td booster every 10 years.  Zoster vaccine. You may need this after age 7.  Pneumococcal 13-valent conjugate (PCV13) vaccine. One dose is recommended after age 28.  Pneumococcal polysaccharide (PPSV23) vaccine. One dose is recommended after age 70. Talk to your health care provider about which screenings and vaccines you need and how often you need them. This information is not intended to replace advice given to you by your health care provider. Make sure you discuss any questions you have with your health care provider. Document Released: 02/07/2015 Document Revised: 10/01/2015 Document Reviewed: 11/12/2014 Elsevier Interactive Patient Education  2017 [employer] Prevention in the Home Falls can cause injuries. They can happen to people of all ages. There are many things you can do to make your home safe and to help prevent falls. What can I do on the outside of my home?  Regularly fix the edges of walkways and driveways and fix any cracks.  Remove anything that might make you trip as you walk through a door, such as a raised step or threshold.  Trim any bushes or trees on the path to your home.  Use bright outdoor lighting.  Clear any walking paths of anything that might make someone trip, such as rocks or tools.  Regularly check to see if handrails are loose or broken. Make sure that both sides of any steps have handrails.  Any raised decks and porches should have guardrails on the edges.  Have any leaves, snow, or ice cleared regularly.  Use sand or salt on walking paths during winter.  Clean up any spills in your garage right away. This includes oil or grease spills. What can I do in the bathroom?  Use night lights.  Install grab bars by the toilet and in the tub and shower. Do not use towel bars as grab bars.  Use non-skid mats or decals  in the tub or shower.  If you need to sit down in the shower, use a plastic, non-slip stool.  Keep the floor dry. Clean up any water that spills on the floor as soon as it happens.  Remove soap buildup in the tub or shower regularly.  Attach bath mats securely with double-sided non-slip rug tape.  Do not have throw rugs and other things on the floor that can make you trip. What can I do in the bedroom?  Use night lights.  Make sure that you have a light by your bed that is easy to reach.  Do not use any sheets or blankets that are too big for your bed. They should not hang down onto the floor.  Have a firm chair that has side arms. You can use this for support while you get dressed.  Do not have throw rugs and other things on the floor that can make you trip. What can I do in the kitchen?  Clean up any spills right away.  Avoid walking on wet floors.  Keep items that you use a lot in easy-to-reach places.  If you need to reach something above you, use a strong step stool that has a grab bar.  Keep electrical cords out of the  way.  Do not use floor polish or wax that makes floors slippery. If you must use wax, use non-skid floor wax.  Do not have throw rugs and other things on the floor that can make you trip. What can I do with my stairs?  Do not leave any items on the stairs.  Make sure that there are handrails on both sides of the stairs and use them. Fix handrails that are broken or loose. Make sure that handrails are as long as the stairways.  Check any carpeting to make sure that it is firmly attached to the stairs. Fix any carpet that is loose or worn.  Avoid having throw rugs at the top or bottom of the stairs. If you do have throw rugs, attach them to the floor with carpet tape.  Make sure that you have a light switch at the top of the stairs and the bottom of the stairs. If you do not have them, ask someone to add them for you. What else can I do to help  prevent falls?  Wear shoes that:  Do not have high heels.  Have rubber bottoms.  Are comfortable and fit you well.  Are closed at the toe. Do not wear sandals.  If you use a stepladder:  Make sure that it is fully opened. Do not climb a closed stepladder.  Make sure that both sides of the stepladder are locked into place.  Ask someone to hold it for you, if possible.  Clearly mark and make sure that you can see:  Any grab bars or handrails.  First and last steps.  Where the edge of each step is.  Use tools that help you move around (mobility aids) if they are needed. These include:  Canes.  Walkers.  Scooters.  Crutches.  Turn on the lights when you go into a dark area. Replace any light bulbs as soon as they burn out.  Set up your furniture so you have a clear path. Avoid moving your furniture around.  If any of your floors are uneven, fix them.  If there are any pets around you, be aware of where they are.  Review your medicines with your doctor. Some medicines can make you feel dizzy. This can increase your chance of falling. Ask your doctor what other things that you can do to help prevent falls. This information is not intended to replace advice given to you by your health care provider. Make sure you discuss any questions you have with your health care provider. Document Released: 11/07/2008 Document Revised: 06/19/2015 Document Reviewed: 02/15/2014 Elsevier Interactive Patient Education  2017 [employer].

## 2020-04-01 NOTE — Assessment & Plan Note (Signed)
Continue fosamax - tolerating well Check Vit D Recheck DEXA

## 2020-04-09 DIAGNOSIS — I10 Essential (primary) hypertension: Secondary | ICD-10-CM | POA: Diagnosis not present

## 2020-04-09 DIAGNOSIS — M810 Age-related osteoporosis without current pathological fracture: Secondary | ICD-10-CM | POA: Diagnosis not present

## 2020-04-09 DIAGNOSIS — E782 Mixed hyperlipidemia: Secondary | ICD-10-CM | POA: Diagnosis not present

## 2020-04-10 LAB — COMPREHENSIVE METABOLIC PANEL
ALT: 19 IU/L (ref 0–32)
AST: 25 IU/L (ref 0–40)
Albumin/Globulin Ratio: 2 (ref 1.2–2.2)
Albumin: 4.7 g/dL (ref 3.7–4.7)
Alkaline Phosphatase: 79 IU/L (ref 44–121)
BUN/Creatinine Ratio: 11 — ABNORMAL LOW (ref 12–28)
BUN: 7 mg/dL — ABNORMAL LOW (ref 8–27)
Bilirubin Total: 0.5 mg/dL (ref 0.0–1.2)
CO2: 22 mmol/L (ref 20–29)
Calcium: 9.3 mg/dL (ref 8.7–10.3)
Chloride: 100 mmol/L (ref 96–106)
Creatinine, Ser: 0.63 mg/dL (ref 0.57–1.00)
Globulin, Total: 2.3 g/dL (ref 1.5–4.5)
Glucose: 96 mg/dL (ref 65–99)
Potassium: 4.9 mmol/L (ref 3.5–5.2)
Sodium: 139 mmol/L (ref 134–144)
Total Protein: 7 g/dL (ref 6.0–8.5)
eGFR: 92 mL/min/{1.73_m2} (ref >59)

## 2020-04-10 LAB — LIPID PANEL WITH LDL/HDL RATIO
Cholesterol, Total: 200 mg/dL — ABNORMAL HIGH (ref 100–199)
HDL: 76 mg/dL (ref >39)
LDL Chol Calc (NIH): 104 mg/dL — ABNORMAL HIGH (ref 0–99)
LDL/HDL Ratio: 1.4 ratio (ref 0.0–3.2)
Triglycerides: 112 mg/dL (ref 0–149)
VLDL Cholesterol Cal: 20 mg/dL (ref 5–40)

## 2020-04-10 LAB — VITAMIN D 25 HYDROXY (VIT D DEFICIENCY, FRACTURES): Vit D, 25-Hydroxy: 43.2 ng/mL (ref 30.0–100.0)

## 2020-05-17 DIAGNOSIS — Z23 Encounter for immunization: Secondary | ICD-10-CM | POA: Diagnosis not present

## 2020-05-27 ENCOUNTER — Ambulatory Visit
Admission: RE | Admit: 2020-05-27 | Discharge: 2020-05-27 | Disposition: A | Discharge status: Home / Self Care | Payer: Medicare Other | Source: Ambulatory Visit | Attending: Family Medicine | Admitting: Family Medicine

## 2020-05-27 ENCOUNTER — Other Ambulatory Visit: Payer: Self-pay

## 2020-05-27 ENCOUNTER — Telehealth: Payer: Self-pay

## 2020-05-27 DIAGNOSIS — Z780 Asymptomatic menopausal state: Secondary | ICD-10-CM | POA: Diagnosis not present

## 2020-05-27 DIAGNOSIS — M810 Age-related osteoporosis without current pathological fracture: Secondary | ICD-10-CM

## 2020-05-27 NOTE — Telephone Encounter (Signed)
Patient advised. Agreed to referral to endo.

## 2020-05-27 NOTE — Telephone Encounter (Signed)
-----   Message from Virginia Crews, MD sent at 05/27/2020  3:43 PM EDT ----- Osteoporosis is getting worse. Recommend endocrinology referral.

## 2020-07-29 DIAGNOSIS — Z20822 Contact with and (suspected) exposure to covid-19: Secondary | ICD-10-CM | POA: Diagnosis not present

## 2020-09-01 ENCOUNTER — Other Ambulatory Visit: Payer: Self-pay | Admitting: Family Medicine

## 2020-09-01 NOTE — Telephone Encounter (Signed)
   Notes to clinic:  Patient scheduled for appt on 10/07/2020 Review for a 90 day supply Due for 6 month follow up    Requested Prescriptions  Pending Prescriptions Disp Refills   lisinopril (ZESTRIL) 40 MG tablet [Pharmacy Med Name: LISINOPRIL '40MG'$  TABLETS] 90 tablet 1    Sig: TAKE 1 TABLET(40 MG) BY MOUTH DAILY      Cardiovascular:  ACE Inhibitors Failed - 09/01/2020  8:11 AM      Failed - Valid encounter within last 6 months    Recent Outpatient Visits           5 months ago Primary hypertension   [facility] [place], [name], MD   10 months ago Essential hypertension   [facility] [place], [name], MD   1 year ago Essential hypertension   [name], [name], MD   2 years ago Acute non-recurrent frontal sinusitis   [name] [place] M, [place]   2 years ago Fever, unspecified fever cause   [facility], [name], MD       Future Appointments             In 1 month Bacigalupo, [name], MD [facility]             Passed - Cr in normal range and within 180 days    Creat  Date Value Ref Range Status  12/14/2016 0.62 0.60 - 0.93 mg/dL Final    Comment:    For patients >29 years of age, the reference limit for Creatinine is approximately 13% higher for people identified as African-American. .    Creatinine, Ser  Date Value Ref Range Status  04/09/2020 0.63 0.57 - 1.00 mg/dL Final          Passed - K in normal range and within 180 days    Potassium  Date Value Ref Range Status  04/09/2020 4.9 3.5 - 5.2 mmol/L Final          Passed - Patient is not pregnant      Passed - Last BP in normal range    BP Readings from Last 1 Encounters:  04/01/20 98/62

## 2020-09-02 ENCOUNTER — Other Ambulatory Visit: Payer: Self-pay | Admitting: Family Medicine

## 2020-09-02 NOTE — Telephone Encounter (Signed)
[facility] system is currently down and they will process script once system is up

## 2020-09-02 NOTE — Telephone Encounter (Signed)
Copied from [place] [phone]. Topic: Quick Communication - Rx Refill/Question >> Sep 02, 2020 11:34 AM Tessa Lerner A wrote: Medication: lisinopril (ZESTRIL) 40 MG tablet  Has the patient contacted their pharmacy? Yes.   (Agent: If no, request that the patient contact the pharmacy for the refill.) (Agent: If yes, when and what did the pharmacy advise?)  Preferred Pharmacy (with phone number or street name): [id number] - [place], [place] - [place]  Phone:  [phone] Fax:  [phone]  Agent: Please be advised that RX refills may take up to 3 business days. We ask that you follow-up with your pharmacy.

## 2020-09-29 DIAGNOSIS — Z20822 Contact with and (suspected) exposure to covid-19: Secondary | ICD-10-CM | POA: Diagnosis not present

## 2020-10-07 ENCOUNTER — Other Ambulatory Visit: Payer: Self-pay

## 2020-10-07 ENCOUNTER — Ambulatory Visit (INDEPENDENT_AMBULATORY_CARE_PROVIDER_SITE_OTHER): Payer: Medicare Other | Admitting: Family Medicine

## 2020-10-07 ENCOUNTER — Encounter: Payer: Self-pay | Admitting: Family Medicine

## 2020-10-07 VITALS — BP 121/82 | HR 74 | Temp 98.0°F | Resp 16 | Wt 88.0 lb

## 2020-10-07 DIAGNOSIS — F411 Generalized anxiety disorder: Secondary | ICD-10-CM | POA: Diagnosis not present

## 2020-10-07 DIAGNOSIS — E782 Mixed hyperlipidemia: Secondary | ICD-10-CM | POA: Diagnosis not present

## 2020-10-07 DIAGNOSIS — Z720 Tobacco use: Secondary | ICD-10-CM | POA: Diagnosis not present

## 2020-10-07 DIAGNOSIS — I10 Essential (primary) hypertension: Secondary | ICD-10-CM | POA: Diagnosis not present

## 2020-10-07 DIAGNOSIS — M810 Age-related osteoporosis without current pathological fracture: Secondary | ICD-10-CM

## 2020-10-07 DIAGNOSIS — Z23 Encounter for immunization: Secondary | ICD-10-CM | POA: Diagnosis not present

## 2020-10-07 DIAGNOSIS — G4700 Insomnia, unspecified: Secondary | ICD-10-CM | POA: Diagnosis not present

## 2020-10-07 MED ORDER — SERTRALINE HCL 50 MG PO TABS: 50.0000 mg | ORAL_TABLET | Freq: Every day | ORAL | 3 refills | Status: DC

## 2020-10-07 NOTE — Progress Notes (Signed)
Established patient visit   Patient: Amanda Tucker   DOB: [birth year]   75 y.o. Female  MRN: [id number] Visit Date: 10/07/2020  Today's healthcare provider: [name], MD   Chief Complaint  Patient presents with   Hypertension   Hyperlipidemia   Anxiety    Subjective    Hypertension Associated symptoms include anxiety. Pertinent negatives include no chest pain, headaches, neck pain, palpitations or shortness of breath.  Hyperlipidemia Pertinent negatives include no chest pain, myalgias or shortness of breath.  Anxiety Symptoms include nervous/anxious behavior. Patient reports no chest pain, dizziness, nausea, palpitations or shortness of breath.     Hypertension, follow-up  BP Readings from Last 3 Encounters:  10/07/20 121/82  04/01/20 98/62  04/01/20 98/62   Wt Readings from Last 3 Encounters:  10/07/20 88 lb (39.9 kg)  04/01/20 96 lb (43.5 kg)  04/01/20 96 lb (43.5 kg)     She was last seen for hypertension 6 months ago.  BP at that visit was 98/62. Management since that visit includes no changes. Tolerating amlodipine 5 mg.  She reports excellent compliance with treatment. She is not having side effects.  She is following a Regular diet. She is exercising. She does smoke.  Use of agents associated with hypertension: none.   Outside blood pressures are not being checked. Symptoms: No chest pain No chest pressure  No palpitations No syncope  No dyspnea No orthopnea  No paroxysmal nocturnal dyspnea No lower extremity edema   Pertinent labs: Lab Results  Component Value Date   CHOL 200 (H) 04/09/2020   HDL 76 04/09/2020   LDLCALC 104 (H) 04/09/2020   TRIG 112 04/09/2020   CHOLHDL 2.8 10/18/2019   Lab Results  Component Value Date   NA 139 04/09/2020   K 4.9 04/09/2020   CREATININE 0.63 04/09/2020   GFRNONAA >60 12/21/2019   GFRAA 108 10/18/2019   GLUCOSE 96 04/09/2020     The 10-year ASCVD risk score (Arnett DK, et al., 2019) is:  29.3%   --------------------------------------------------------------------------------------------------- Lipid/Cholesterol, Follow-up  Last lipid panel Other pertinent labs  Lab Results  Component Value Date   CHOL 200 (H) 04/09/2020   HDL 76 04/09/2020   LDLCALC 104 (H) 04/09/2020   TRIG 112 04/09/2020   CHOLHDL 2.8 10/18/2019   Lab Results  Component Value Date   ALT 19 04/09/2020   AST 25 04/09/2020   PLT 215 12/21/2019   TSH 0.71 12/14/2016     She was last seen for this 6 months ago.  Management since that visit includes no changes.  She reports excellent compliance with treatment. She is not having side effects.   Symptoms: No chest pain No chest pressure/discomfort  No dyspnea No lower extremity edema  No numbness or tingling of extremity No orthopnea  No palpitations No paroxysmal nocturnal dyspnea  No speech difficulty No syncope   Current diet: in general, a "healthy" diet   Current exercise:   The 10-year ASCVD risk score (Arnett DK, et al., 2019) is: 29.3%  --------------------------------------------------------------------------------------------------- Anxiety, Follow-up  She was last seen for anxiety 6 months ago. Changes made at last visit include no changes. Changes made in office today include beginning Zoloft 50 mg.  She has situational stress elevating her anxiety. She is interested in beginning an antidepressant medication.  She uses the xanx 1/2 pill 1 x a day. She reports her anxiety was low before this summer.   She reports excellent compliance with treatment. She reports excellent  tolerance of treatment. She is not having side effects.   She feels her anxiety is mild and Worse since last visit.  She occasionally has trouble sleeping through the night.   Symptoms: No chest pain No difficulty concentrating  No dizziness No fatigue  No feelings of losing control Yes insomnia  No irritable No palpitations  No panic attacks No  racing thoughts  No shortness of breath No sweating  No tremors/shakes    GAD-7 Results GAD-7 Generalized Anxiety Disorder Screening Tool 10/07/2020 04/01/2020 03/08/2019  1. Feeling Nervous, Anxious, or on Edge '1 1 2  '$ 2. Not Being Able to Stop or Control Worrying 0 2 2  3. Worrying Too Much About Different Things '1 1 3  '$ 4. Trouble Relaxing 0 1 2  5. Being So Restless it's Hard To Sit Still 0 0 1  6. Becoming Easily Annoyed or Irritable 0 0 0  7. Feeling Afraid As If Something Awful Might Happen '1 2 1  '$ Total GAD-7 Score '3 7 11  '$ Difficulty At Work, Home, or Getting  Along With Others? Not difficult at all Not difficult at all Not difficult at all    PHQ-9 Scores PHQ9 SCORE ONLY 10/07/2020 04/01/2020 04/01/2020  PHQ-9 Total Score 7 1 0   Osteoporosis  She denies seeing endocrinology because her appointment has been rescheduled. The fosamax 70 mg isn't helping with he osteoporosis.   Flu Vaccine-10/07/20  ---------------------------------------------------------------------------------------------------     Medications: Outpatient Medications Prior to Visit  Medication Sig   alendronate (FOSAMAX) 70 MG tablet Take 1 tablet (70 mg total) by mouth once a week. Take with a full glass of water on an empty stomach.   ALPRAZolam (XANAX) 0.5 MG tablet TAKE 1/2 TABLET BY MOUTH TWICE DAILY AS NEEDED FOR ANXIETY   amLODipine (NORVASC) 5 MG tablet TAKE 1 TABLET(5 MG) BY MOUTH DAILY   aspirin EC 81 MG tablet Take 81 mg daily by mouth.   aspirin-acetaminophen-caffeine (EXCEDRIN MIGRAINE) 250-250-65 MG tablet Take 2 tablets by mouth daily as needed for headache.   ibuprofen (ADVIL) 200 MG tablet Take 200 mg by mouth every 6 (six) hours as needed.   lisinopril (ZESTRIL) 40 MG tablet TAKE 1 TABLET(40 MG) BY MOUTH DAILY   Multiple Vitamin (MULTIVITAMIN ADULT PO) Take by mouth daily.   omeprazole (PRILOSEC) 20 MG capsule Take 20 mg by mouth daily. Unsure of dose   simvastatin (ZOCOR) 40 MG tablet Take 1  tablet (40 mg total) by mouth daily.   VITAMIN D PO Take by mouth daily.   No facility-administered medications prior to visit.    Review of Systems  Constitutional:  Negative for activity change, appetite change, chills, fatigue and fever.  HENT:  Negative for ear pain, sinus pressure, sinus pain and sore throat.   Eyes:  Negative for pain and visual disturbance.  Respiratory:  Negative for cough, chest tightness, shortness of breath and wheezing.   Cardiovascular:  Negative for chest pain, palpitations and leg swelling.  Gastrointestinal:  Negative for abdominal pain, blood in stool, diarrhea, nausea and vomiting.  Genitourinary:  Negative for dysuria, flank pain, frequency, pelvic pain and urgency.  Musculoskeletal:  Negative for back pain, myalgias and neck pain.  Neurological:  Negative for dizziness, weakness, light-headedness, numbness and headaches.  Psychiatric/Behavioral:  The patient is nervous/anxious.       Objective    BP 121/82 (BP Location: Right Arm, Patient Position: Sitting, Cuff Size: Small)   Pulse 74   Temp 98 F (36.7 C) (  Oral)   Resp 16   Wt 88 lb (39.9 kg)   BMI 19.04 kg/m  {Show previous vital signs (optional):23777}  Physical Exam Vitals reviewed.  Constitutional:      General: She is not in acute distress.    Appearance: Normal appearance. She is well-developed. She is not diaphoretic.  HENT:     Head: Normocephalic and atraumatic.  Eyes:     General: No scleral icterus.    Conjunctiva/sclera: Conjunctivae normal.  Neck:     Thyroid: No thyromegaly.  Cardiovascular:     Rate and Rhythm: Normal rate and regular rhythm.     Pulses: Normal pulses.     Heart sounds: Normal heart sounds. No murmur heard. Pulmonary:     Effort: Pulmonary effort is normal. No respiratory distress.     Breath sounds: Normal breath sounds. No wheezing, rhonchi or rales.  Musculoskeletal:     Cervical back: Neck supple.     Right lower leg: No edema.     Left  lower leg: No edema.  Lymphadenopathy:     Cervical: No cervical adenopathy.  Skin:    General: Skin is warm and dry.     Findings: No rash.  Neurological:     Mental Status: She is alert and oriented to person, place, and time. Mental status is at baseline.  Psychiatric:        Mood and Affect: Mood normal.        Behavior: Behavior normal.      No results found for any visits on 10/07/20.  Assessment & Plan     Problem List Items Addressed This Visit       Cardiovascular and Mediastinum   Hypertension - Primary    Well controlled Continue current medications Recheck metabolic panel F/u in 6 months       Relevant Orders   Comprehensive metabolic panel     Musculoskeletal and Integument   Age-related osteoporosis without current pathological fracture    Continue fosamax, but likely will change at upcoming Endocrinology appt as her DEXA has worsened in the last 2 yrs Continue Ca/Vit D supplement Continue weight bearing exercise      Relevant Orders   VITAMIN D 25 Hydroxy (Vit-D Deficiency, Fractures)     Other   Hyperlipidemia    Previously well controlled Continue statin Repeat FLP and CMP      Relevant Orders   Comprehensive metabolic panel   Lipid panel   Generalized anxiety disorder    Chronic and uncontrolled Exacerbated by several stressful things this summer Will resume Zoloft '50mg'$  daily Continue low dose Xanax prn - have discussed risks of long term benzos      Relevant Medications   sertraline (ZOLOFT) 50 MG tablet   Tobacco abuse    Worsening since stress has been high continue to focus on ssation      Insomnia    Related to anxiety Will see if it improves iwht resuming Zoloft as above      Other Visit Diagnoses     Need for influenza vaccination       Relevant Orders   Flu Vaccine QUAD High Dose(Fluad) (Completed)        Return in about 6 months (around 04/06/2021) for AWV, CPE.      I,Essence Turner,acting as a scribe for  [name], MD.,have documented all relevant documentation on the behalf of [name], MD,as directed by  [name], MD while in the presence of [name], MD.  I, [name], MD, have reviewed all documentation for this visit. The documentation on 10/07/20 for the exam, diagnosis, procedures, and orders are all accurate and complete.   Dhiren Azimi, [name], MD, MPH [facility]

## 2020-10-07 NOTE — Assessment & Plan Note (Signed)
Well controlled Continue current medications Recheck metabolic panel F/u in 6 months  

## 2020-10-07 NOTE — Assessment & Plan Note (Signed)
Chronic and uncontrolled Exacerbated by several stressful things this summer Will resume Zoloft '50mg'$  daily Continue low dose Xanax prn - have discussed risks of long term benzos

## 2020-10-07 NOTE — Assessment & Plan Note (Signed)
Previously well controlled Continue statin Repeat FLP and CMP  

## 2020-10-07 NOTE — Assessment & Plan Note (Signed)
Related to anxiety Will see if it improves iwht resuming Zoloft as above

## 2020-10-07 NOTE — Assessment & Plan Note (Signed)
Continue fosamax, but likely will change at upcoming Endocrinology appt as her DEXA has worsened in the last 2 yrs Continue Ca/Vit D supplement Continue weight bearing exercise

## 2020-10-07 NOTE — Patient Instructions (Signed)
The CDC recommends two doses of Shingrix (the shingles vaccine) separated by 2 to 6 months for adults age 76 years and older. I recommend checking with your insurance plan regarding coverage for this vaccine.   Consider new COVID booster (bivalent)

## 2020-10-07 NOTE — Assessment & Plan Note (Signed)
Worsening since stress has been high continue to focus on ssation

## 2020-10-08 LAB — LIPID PANEL
Chol/HDL Ratio: 2.7 ratio (ref 0.0–4.4)
Cholesterol, Total: 188 mg/dL (ref 100–199)
HDL: 70 mg/dL (ref >39)
LDL Chol Calc (NIH): 103 mg/dL — ABNORMAL HIGH (ref 0–99)
Triglycerides: 83 mg/dL (ref 0–149)
VLDL Cholesterol Cal: 15 mg/dL (ref 5–40)

## 2020-10-08 LAB — COMPREHENSIVE METABOLIC PANEL
ALT: 15 IU/L (ref 0–32)
AST: 19 IU/L (ref 0–40)
Albumin/Globulin Ratio: 1.9 (ref 1.2–2.2)
Albumin: 4.6 g/dL (ref 3.7–4.7)
Alkaline Phosphatase: 66 IU/L (ref 44–121)
BUN/Creatinine Ratio: 15 (ref 12–28)
BUN: 8 mg/dL (ref 8–27)
Bilirubin Total: 0.5 mg/dL (ref 0.0–1.2)
CO2: 24 mmol/L (ref 20–29)
Calcium: 9.2 mg/dL (ref 8.7–10.3)
Chloride: 100 mmol/L (ref 96–106)
Creatinine, Ser: 0.54 mg/dL — ABNORMAL LOW (ref 0.57–1.00)
Globulin, Total: 2.4 g/dL (ref 1.5–4.5)
Glucose: 91 mg/dL (ref 65–99)
Potassium: 4 mmol/L (ref 3.5–5.2)
Sodium: 137 mmol/L (ref 134–144)
Total Protein: 7 g/dL (ref 6.0–8.5)
eGFR: 95 mL/min/{1.73_m2} (ref >59)

## 2020-10-08 LAB — VITAMIN D 25 HYDROXY (VIT D DEFICIENCY, FRACTURES): Vit D, 25-Hydroxy: 43.2 ng/mL (ref 30.0–100.0)

## 2020-10-22 ENCOUNTER — Telehealth: Payer: Self-pay | Admitting: Family Medicine

## 2020-10-22 NOTE — Telephone Encounter (Signed)
[facility] faxed refill request for the following medications:  ALPRAZolam (XANAX) 0.5 MG tablet   Please advise.  

## 2020-10-23 ENCOUNTER — Other Ambulatory Visit: Payer: Self-pay

## 2020-10-24 MED ORDER — ALPRAZOLAM 0.5 MG PO TABS: ORAL_TABLET | ORAL | 5 refills | Status: DC

## 2021-02-16 ENCOUNTER — Other Ambulatory Visit: Payer: Self-pay | Admitting: Family Medicine

## 2021-02-16 NOTE — Telephone Encounter (Signed)
LOV: 10/07/2020 NOV: 04/07/2021 LR:10/07/2020

## 2021-03-01 DIAGNOSIS — Z23 Encounter for immunization: Secondary | ICD-10-CM | POA: Diagnosis not present

## 2021-04-06 NOTE — Progress Notes (Unsigned)
Established patient visit   Patient: Amanda Tucker   DOB: Jan 24, 1946   76 y.o. Female  MRN: [id number] Visit Date: 04/07/2021  Today's healthcare provider: [name], MD   No chief complaint on file.  Subjective    HPI  Hypertension, follow-up  BP Readings from Last 3 Encounters:  10/07/20 121/82  04/01/20 98/62  04/01/20 98/62   Wt Readings from Last 3 Encounters:  10/07/20 88 lb (39.9 kg)  04/01/20 96 lb (43.5 kg)  04/01/20 96 lb (43.5 kg)     She was last seen for hypertension 6 months ago.  BP at that visit was 121/82. Management since that visit includes Continue current medications. She reports {excellent/good/fair/poor:19665} compliance with treatment. She {is/is not:9024} having side effects. {document side effects if present:1} She {is/is not:9024} exercising. She {is/is not:9024} adherent to low salt diet.   Outside blood pressures are {enter patient reported home BP, or 'not being checked':1}.  She {does/does not:200015} smoke.  Use of agents associated with hypertension: {bp agents assoc with hypertension:511::"none"}.   --------------------------------------------------------------------------------------------------- Lipid/Cholesterol, follow-up  Last Lipid Panel: Lab Results  Component Value Date   CHOL 188 10/07/2020   LDLCALC 103 (H) 10/07/2020   HDL 70 10/07/2020   TRIG 83 10/07/2020    She was last seen for this 6 months ago.  Management since that visit includes Continue statin.  She reports {excellent/good/fair/poor:19665} compliance with treatment. She {is/is not:9024} having side effects. {document side effects if present:1}  Symptoms: {Yes/No:20286} appetite changes {Yes/No:20286} foot ulcerations  {Yes/No:20286} chest pain {Yes/No:20286} chest pressure/discomfort  {Yes/No:20286} dyspnea {Yes/No:20286} orthopnea  {Yes/No:20286} fatigue {Yes/No:20286} lower extremity edema  {Yes/No:20286} palpitations {Yes/No:20286}  paroxysmal nocturnal dyspnea  {Yes/No:20286} nausea {Yes/No:20286} numbness or tingling of extremity  {Yes/No:20286} polydipsia {Yes/No:20286} polyuria  {Yes/No:20286} speech difficulty {Yes/No:20286} syncope   She is following a {diet:21022986} diet. Current exercise: {exercise JTTSV:77939}  Last metabolic panel Lab Results  Component Value Date   GLUCOSE 91 10/07/2020   NA 137 10/07/2020   K 4.0 10/07/2020   BUN 8 10/07/2020   CREATININE 0.54 (L) 10/07/2020   EGFR 95 10/07/2020   GFRNONAA >60 12/21/2019   CALCIUM 9.2 10/07/2020   AST 19 10/07/2020   ALT 15 10/07/2020   The 10-year ASCVD risk score (Arnett DK, et al., 2019) is: 29.1%  ---------------------------------------------------------------------------------------------------  Follow up for GAD  The patient was last seen for this 6 months ago. Changes made at last visit include Will resume Zoloft 45m daily Continue low dose Xanax prn.  She reports {excellent/good/fair/poor:19665} compliance with treatment. She feels that condition is {improved/worse/unchanged:3041574}. She {is/is not:21021397} having side effects. ***  -----------------------------------------------------------------------------------------  GAD 7 : Generalized Anxiety Score 10/07/2020 04/01/2020 03/08/2019 03/09/2018  Nervous, Anxious, on Edge _0 Control/stop worrying 0 _1 Worry too much - different things _2 Trouble relaxing 0 _3 Restless 0 0 1 0  Easily annoyed or irritable 0 0 0 0  Afraid - awful might happen _4 Total GAD 7 Score _5 Anxiety Difficulty Not difficult at all Not difficult at all Not difficult at all Somewhat difficult     Medications: Outpatient Medications Prior to Visit  Medication Sig   alendronate (FOSAMAX) 70 MG tablet Take 1 tablet (70 mg total) by mouth once a week. Take with a full glass of water on an empty stomach.   ALPRAZolam (XANAX) 0.5 MG tablet TAKE 1/2  TABLET BY MOUTH TWICE  DAILY AS NEEDED FOR ANXIETY   amLODipine (NORVASC) 5 MG tablet TAKE 1 TABLET(5 MG) BY MOUTH DAILY   aspirin EC 81 MG tablet Take 81 mg daily by mouth.   aspirin-acetaminophen-caffeine (EXCEDRIN MIGRAINE) 250-250-65 MG tablet Take 2 tablets by mouth daily as needed for headache.   ibuprofen (ADVIL) 200 MG tablet Take 200 mg by mouth every 6 (six) hours as needed.   lisinopril (ZESTRIL) 40 MG tablet TAKE 1 TABLET(40 MG) BY MOUTH DAILY   Multiple Vitamin (MULTIVITAMIN ADULT PO) Take by mouth daily.   omeprazole (PRILOSEC) 20 MG capsule Take 20 mg by mouth daily. Unsure of dose   sertraline (ZOLOFT) 50 MG tablet TAKE 1 TABLET(50 MG) BY MOUTH DAILY   simvastatin (ZOCOR) 40 MG tablet Take 1 tablet (40 mg total) by mouth daily.   VITAMIN D PO Take by mouth daily.   No facility-administered medications prior to visit.    Review of Systems  {Labs   Heme   Chem   Endocrine   Serology   Results Review (optional):23779}   Objective    There were no vitals taken for this visit. {Show previous vital signs (optional):23777}  Physical Exam  ***  No results found for any visits on 04/07/21.  Assessment & Plan     ***  No follow-ups on file.      {provider attestation***:1}   [name], MD  [facility] [phone] (phone) [phone] (fax)  [place]

## 2021-04-07 ENCOUNTER — Other Ambulatory Visit: Payer: Self-pay

## 2021-04-07 ENCOUNTER — Ambulatory Visit (INDEPENDENT_AMBULATORY_CARE_PROVIDER_SITE_OTHER): Payer: Medicare Other | Admitting: Family Medicine

## 2021-04-07 ENCOUNTER — Encounter: Payer: Self-pay | Admitting: Family Medicine

## 2021-04-07 ENCOUNTER — Ambulatory Visit (INDEPENDENT_AMBULATORY_CARE_PROVIDER_SITE_OTHER): Payer: Medicare Other

## 2021-04-07 VITALS — BP 92/58 | HR 54 | Temp 97.6°F | Ht <= 58 in | Wt 82.5 lb

## 2021-04-07 VITALS — BP 98/60 | HR 54 | Temp 97.6°F | Resp 16 | Ht 58.8 in | Wt 82.0 lb

## 2021-04-07 DIAGNOSIS — E782 Mixed hyperlipidemia: Secondary | ICD-10-CM

## 2021-04-07 DIAGNOSIS — Z0000 Encounter for general adult medical examination without abnormal findings: Secondary | ICD-10-CM | POA: Diagnosis not present

## 2021-04-07 DIAGNOSIS — R636 Underweight: Secondary | ICD-10-CM | POA: Diagnosis not present

## 2021-04-07 DIAGNOSIS — M810 Age-related osteoporosis without current pathological fracture: Secondary | ICD-10-CM

## 2021-04-07 DIAGNOSIS — E440 Moderate protein-calorie malnutrition: Secondary | ICD-10-CM

## 2021-04-07 DIAGNOSIS — Z720 Tobacco use: Secondary | ICD-10-CM

## 2021-04-07 DIAGNOSIS — F411 Generalized anxiety disorder: Secondary | ICD-10-CM | POA: Diagnosis not present

## 2021-04-07 DIAGNOSIS — I10 Essential (primary) hypertension: Secondary | ICD-10-CM

## 2021-04-07 NOTE — Assessment & Plan Note (Signed)
Hypotensive today ?Asymptomatic ?Continue current meds - but hold amlodipine  ?Return precautions discussed ?Check BMP ?F/u in 13m?

## 2021-04-07 NOTE — Assessment & Plan Note (Signed)
Chronic and stable ?No changes to meds at this time ?Have discussed risks of benzos in the elderly ?

## 2021-04-07 NOTE — Assessment & Plan Note (Signed)
Discussion regarding the harms of tobacco use, the benefits of cessation, and methods of cessation ?Discussed that there are medication options to help with cessation ?Patient is currently precontemplative  ?Will reassess at next visit  ?

## 2021-04-07 NOTE — Assessment & Plan Note (Signed)
As above ?Will work on increaed calorie intake ?

## 2021-04-07 NOTE — Assessment & Plan Note (Signed)
Discussed importance of healthy weight management ?Discussed diet and exercise ?Patient will work on increasing calorie intake ?<10% weight loss ?Consider remeron in the future ?

## 2021-04-07 NOTE — Progress Notes (Signed)
? ?Subjective:  ? Amanda Tucker is a 76 y.o. female who presents for Medicare Annual (Subsequent) preventive examination. ? ?Review of Systems    ? ?  ? ?   ?Objective:  ?  ?Today's Vitals  ? 04/07/21 0939  ?BP: (!) 92/58  ?Pulse: (!) 54  ?Temp: 97.6 ?F (36.4 ?C)  ?TempSrc: Oral  ?SpO2: 100%  ?Weight: 82 lb 8 oz (37.4 kg)  ?Height: '4\' 9"'$  (1.448 m)  ? ?Body mass index is 17.85 kg/m?. ? ?Advanced Directives 04/01/2020 02/05/2020 12/21/2019 12/21/2019 03/01/2019 02/01/2019 06/12/2018  ?Does Patient Have a Medical Advance Directive? No No No No No No No  ?Would patient like information on creating a medical advance directive? No - Patient declined No - Patient declined - No - Patient declined No - Patient declined No - Patient declined No - Patient declined  ? ? ?Current Medications (verified) ?Outpatient Encounter Medications as of 04/07/2021  ?Medication Sig  ? alendronate (FOSAMAX) 70 MG tablet Take 1 tablet (70 mg total) by mouth once a week. Take with a full glass of water on an empty stomach.  ? ALPRAZolam (XANAX) 0.5 MG tablet TAKE 1/2 TABLET BY MOUTH TWICE DAILY AS NEEDED FOR ANXIETY  ? amLODipine (NORVASC) 5 MG tablet TAKE 1 TABLET(5 MG) BY MOUTH DAILY  ? aspirin EC 81 MG tablet Take 81 mg daily by mouth.  ? aspirin-acetaminophen-caffeine (EXCEDRIN MIGRAINE) 250-250-65 MG tablet Take 2 tablets by mouth daily as needed for headache.  ? ibuprofen (ADVIL) 200 MG tablet Take 200 mg by mouth every 6 (six) hours as needed.  ? lisinopril (ZESTRIL) 40 MG tablet TAKE 1 TABLET(40 MG) BY MOUTH DAILY  ? Multiple Vitamin (MULTIVITAMIN ADULT PO) Take by mouth daily.  ? omeprazole (PRILOSEC) 20 MG capsule Take 20 mg by mouth daily. Unsure of dose  ? sertraline (ZOLOFT) 50 MG tablet TAKE 1 TABLET(50 MG) BY MOUTH DAILY  ? simvastatin (ZOCOR) 40 MG tablet Take 1 tablet (40 mg total) by mouth daily.  ? VITAMIN D PO Take by mouth daily.  ? ?No facility-administered encounter medications on file as of 04/07/2021.  ? ? ?Allergies  (verified) ?Patient has no known allergies.  ? ?History: ?Past Medical History:  ?Diagnosis Date  ? Anxiety   ? Arthritis   ? Balance problem   ? Depression   ? Diverticulitis   ? Environmental and seasonal allergies   ? GERD (gastroesophageal reflux disease)   ? HOH (hard of hearing)   ? Hyperlipidemia   ? Hypertension   ? IBS (irritable bowel syndrome)   ? Osteoporosis   ? Right sided sciatica   ? Vertigo   ? ?Past Surgical History:  ?Procedure Laterality Date  ? CATARACT EXTRACTION W/PHACO Right 09/07/2018  ? Procedure: CATARACT EXTRACTION PHACO AND INTRAOCULAR LENS PLACEMENT;  Surgeon: Marchia Meiers, MD;  Location: [facility] ORS;  Service: Ophthalmology;  Laterality: Right;  [place]: 01:08.2 ?CDE: 11.02 ?Fluid Pack Lot # [id number]  ? CATARACT EXTRACTION W/PHACO Left 02/01/2019  ? Procedure: CATARACT EXTRACTION PHACO AND INTRAOCULAR LENS PLACEMENT ([place]) LEFT VISION BLUE;  Surgeon: Marchia Meiers, MD;  Location: [facility] ORS;  Service: Ophthalmology;  Laterality: Left;  Lot #:  [id number] ?[place]:  00.51.3 ?CDE:  6.98  ? COLONOSCOPY WITH PROPOFOL N/A 02/27/2018  ? Procedure: COLONOSCOPY WITH PROPOFOL;  Surgeon: Jonathon Bellows, MD;  Location: [facility] ENDOSCOPY;  Service: Gastroenterology;  Laterality: N/A;  ? ESOPHAGOGASTRODUODENOSCOPY N/A 02/05/2020  ? Procedure: ESOPHAGOGASTRODUODENOSCOPY (EGD);  Surgeon: Lesly Rubenstein, MD;  Location: [facility] ENDOSCOPY;  Service: Endoscopy;  Laterality: N/A;  ? ESOPHAGOGASTRODUODENOSCOPY (EGD) WITH PROPOFOL N/A 12/21/2019  ? Procedure: ESOPHAGOGASTRODUODENOSCOPY (EGD) WITH PROPOFOL;  Surgeon: Lesly Rubenstein, MD;  Location: [facility] ENDOSCOPY;  Service: Endoscopy;  Laterality: N/A;  ? ?Family History  ?Problem Relation Age of Onset  ? Dementia Mother 20  ? Hypertension Father   ? Hyperlipidemia Father   ? Heart attack Father 18  ? Pancreatic cancer Brother   ? CVA Maternal Grandfather   ? Hypothyroidism Daughter   ?     [place]  ? Breast cancer Neg Hx   ? Colon cancer Neg Hx   ? ?Social History   ? ?Socioeconomic History  ? Marital status: Divorced  ?  Spouse name: Not on file  ? Number of children: 2  ? Years of education: Not on file  ? Highest education level: Some college, no degree  ?Occupational History  ? Occupation: retired  ?Tobacco Use  ? Smoking status: Some Days  ?  Packs/day: 0.25  ?  Years: 54.00  ?  Pack years: 13.50  ?  Types: Cigarettes  ?  Last attempt to quit: 02/08/2018  ?  Years since quitting: 3.1  ? Smokeless tobacco: Never  ? Tobacco comments:  ?  smokes 3 cigarettes out of a pack then throws away  ?Vaping Use  ? Vaping Use: Never used  ?Substance and Sexual Activity  ? Alcohol use: Yes  ?  Alcohol/week: 3.0 standard drinks  ?  Types: 3 Glasses of wine per week  ?  Comment: 1 glass 3 times a week  ? Drug use: Never  ? Sexual activity: Never  ?Other Topics Concern  ? Not on file  ?Social History Narrative  ? Not on file  ? ?Social Determinants of Health  ? ?Financial Resource Strain: Not on file  ?Food Insecurity: Not on file  ?Transportation Needs: Not on file  ?Physical Activity: Not on file  ?Stress: Not on file  ?Social Connections: Not on file  ? ? ?Tobacco Counseling ?Ready to quit: Not Answered ?Counseling given: Not Answered ?Tobacco comments: smokes 3 cigarettes out of a pack then throws away ? ? ?Clinical Intake: ? ?Pre-visit preparation completed: Yes ? ?Pain : No/denies pain ? ?  ? ?Nutritional Risks: None ?Diabetes: No ? ?How often do you need to have someone help you when you read instructions, pamphlets, or other written materials from your doctor or pharmacy?: 1 - Never ? ?Diabetic?no ? ?Interpreter Needed?: No ? ?Information entered by :: Kirke Shaggy, LPN ? ? ?Activities of Daily Living ?In your present state of health, do you have any difficulty performing the following activities: 10/07/2020  ?Hearing? N  ?Vision? N  ?Difficulty concentrating or making decisions? N  ?Walking or climbing stairs? Y  ?Dressing or bathing? N  ?Doing errands, shopping? N  ?Some recent  data might be hidden  ? ? ?Patient Care Team: ?Virginia Crews, MD as PCP - General (Family Medicine) ?Pa, [facility]) ?Lesly Rubenstein, MD as Consulting Physician (Gastroenterology) ? ?Indicate any recent Medical Services you may have received from other than [facility] providers in the past year (date may be approximate). ? ?   ?Assessment:  ? This is a routine wellness examination for [facility]. ? ?Hearing/Vision screen ?No results found. ? ?Dietary issues and exercise activities discussed: ?  ? ? Goals Addressed   ?None ?  ? ?Depression Screen ?PHQ 2/9 Scores 10/07/2020 04/01/2020 04/01/2020 10/19/2019 03/08/2019 03/01/2019 03/01/2019  ?PHQ - 2 Score  2 0 0 1 0 0 0  ?PHQ- 9 Score 7 1 - 2 4 - -  ?  ?Fall Risk ?Fall Risk  10/07/2020 04/01/2020 10/19/2019 03/01/2019 12/07/2017  ?Falls in the past year? 0 0 0 0 1  ?Number falls in past yr: 0 0 0 0 1  ?Injury with Fall? 0 0 0 0 0  ?Risk for fall due to : No Fall Risks - No Fall Risks - Impaired balance/gait  ?Risk for fall due to: Comment - - - - Pt needs a PT evaluation due to imparied gait.   ?Follow up Falls evaluation completed - Falls evaluation completed - -  ? ? ?FALL RISK PREVENTION PERTAINING TO THE HOME: ? ?Any stairs in or around the home? Yes  ?If so, are there any without handrails? No  ?Home free of loose throw rugs in walkways, pet beds, electrical cords, etc? Yes  ?Adequate lighting in your home to reduce risk of falls? Yes  ? ?ASSISTIVE DEVICES UTILIZED TO PREVENT FALLS: ? ?Life alert? No  ?Use of a cane, walker or w/c? No  ?Grab bars in the bathroom? Yes  ?Shower chair or bench in shower? No  ?Elevated toilet seat or a handicapped toilet? Yes  ? ?TIMED UP AND GO: ? ?Was the test performed? Yes .  ?Length of time to ambulate 10 feet: 6 sec.  ? ?Gait slow and steady without use of assistive device ? ?Cognitive Function: ?  ?  ?6CIT Screen 03/01/2019 12/07/2017  ?What Year? 0 points 0 points  ?What month? 0 points 0 points  ?What time? 0 points 0 points   ?Count back from 20 0 points 0 points  ?Months in reverse 0 points 0 points  ?Repeat phrase 0 points 2 points  ?Total Score 0 2  ? ? ?Immunizations ?Immunization History  ?Administered Date(s) Administered  ? Fluad Quad(high Dos

## 2021-04-07 NOTE — Patient Instructions (Signed)
Stop amlodipine.

## 2021-04-07 NOTE — Assessment & Plan Note (Signed)
Chronic and uncontrolled ?Worsening on last DEXA ?Unable to get appt with Endocrinology - will place new referral today ?Continue fosamax for now ?

## 2021-04-07 NOTE — Assessment & Plan Note (Signed)
Previously well controlled Continue statin Repeat FLP and CMP annually 

## 2021-04-07 NOTE — Patient Instructions (Signed)
Amanda Tucker , ?Thank you for taking time to come for your Medicare Wellness Visit. I appreciate your ongoing commitment to your health goals. Please review the following plan we discussed and let me know if I can assist you in the future.  ? ?Screening recommendations/referrals: ?Colonoscopy: aged out ?Mammogram: aged out ?Bone Density: 05/27/20, due next year ?Recommended yearly ophthalmology/optometry visit for glaucoma screening and checkup ?Recommended yearly dental visit for hygiene and checkup ? ?Vaccinations: ?Influenza vaccine: 10/07/20 ?Pneumococcal vaccine: 12/07/17 ?Tdap vaccine: n/d ?Shingles vaccine: n/d   ?Covid-19:03/09/19, 04/03/19, 10/25/19, 05/17/20, 03/02/21 ? ?Advanced directives: no ? ?Conditions/risks identified: none ? ?Next appointment: Follow up in one year for your annual wellness visit 04/12/22 @ 9:40am in person ? ? ?Preventive Care 45 Years and Older, Female ?Preventive care refers to lifestyle choices and visits with your health care provider that can promote health and wellness. ?What does preventive care include? ?A yearly physical exam. This is also called an annual well check. ?Dental exams once or twice a year. ?Routine eye exams. Ask your health care provider how often you should have your eyes checked. ?Personal lifestyle choices, including: ?Daily care of your teeth and gums. ?Regular physical activity. ?Eating a healthy diet. ?Avoiding tobacco and drug use. ?Limiting alcohol use. ?Practicing safe sex. ?Taking low-dose aspirin every day. ?Taking vitamin and mineral supplements as recommended by your health care provider. ?What happens during an annual well check? ?The services and screenings done by your health care provider during your annual well check will depend on your age, overall health, lifestyle risk factors, and family history of disease. ?Counseling  ?Your health care provider may ask you questions about your: ?Alcohol use. ?Tobacco use. ?Drug use. ?Emotional well-being. ?Home  and relationship well-being. ?Sexual activity. ?Eating habits. ?History of falls. ?Memory and ability to understand (cognition). ?Work and work Statistician. ?Reproductive health. ?Screening  ?You may have the following tests or measurements: ?Height, weight, and BMI. ?Blood pressure. ?Lipid and cholesterol levels. These may be checked every 5 years, or more frequently if you are over 67 years old. ?Skin check. ?Lung cancer screening. You may have this screening every year starting at age 55 if you have a 30-pack-year history of smoking and currently smoke or have quit within the past 15 years. ?Fecal occult blood test (FOBT) of the stool. You may have this test every year starting at age 42. ?Flexible sigmoidoscopy or colonoscopy. You may have a sigmoidoscopy every 5 years or a colonoscopy every 10 years starting at age 13. ?Hepatitis C blood test. ?Hepatitis B blood test. ?Sexually transmitted disease (STD) testing. ?Diabetes screening. This is done by checking your blood sugar (glucose) after you have not eaten for a while (fasting). You may have this done every 1-3 years. ?Bone density scan. This is done to screen for osteoporosis. You may have this done starting at age 69. ?Mammogram. This may be done every 1-2 years. Talk to your health care provider about how often you should have regular mammograms. ?Talk with your health care provider about your test results, treatment options, and if necessary, the need for more tests. ?Vaccines  ?Your health care provider may recommend certain vaccines, such as: ?Influenza vaccine. This is recommended every year. ?Tetanus, diphtheria, and acellular pertussis (Tdap, Td) vaccine. You may need a Td booster every 10 years. ?Zoster vaccine. You may need this after age 52. ?Pneumococcal 13-valent conjugate (PCV13) vaccine. One dose is recommended after age 51. ?Pneumococcal polysaccharide (PPSV23) vaccine. One dose is recommended after age  43. ?Talk to your health care provider  about which screenings and vaccines you need and how often you need them. ?This information is not intended to replace advice given to you by your health care provider. Make sure you discuss any questions you have with your health care provider. ?Document Released: 02/07/2015 Document Revised: 10/01/2015 Document Reviewed: 11/12/2014 ?Elsevier Interactive Patient Education ? 2017 [place]. ? ?Fall Prevention in the Home ?Falls can cause injuries. They can happen to people of all ages. There are many things you can do to make your home safe and to help prevent falls. ?What can I do on the outside of my home? ?Regularly fix the edges of walkways and driveways and fix any cracks. ?Remove anything that might make you trip as you walk through a door, such as a raised step or threshold. ?Trim any bushes or trees on the path to your home. ?Use bright outdoor lighting. ?Clear any walking paths of anything that might make someone trip, such as rocks or tools. ?Regularly check to see if handrails are loose or broken. Make sure that both sides of any steps have handrails. ?Any raised decks and porches should have guardrails on the edges. ?Have any leaves, snow, or ice cleared regularly. ?Use sand or salt on walking paths during winter. ?Clean up any spills in your garage right away. This includes oil or grease spills. ?What can I do in the bathroom? ?Use night lights. ?Install grab bars by the toilet and in the tub and shower. Do not use towel bars as grab bars. ?Use non-skid mats or decals in the tub or shower. ?If you need to sit down in the shower, use a plastic, non-slip stool. ?Keep the floor dry. Clean up any water that spills on the floor as soon as it happens. ?Remove soap buildup in the tub or shower regularly. ?Attach bath mats securely with double-sided non-slip rug tape. ?Do not have throw rugs and other things on the floor that can make you trip. ?What can I do in the bedroom? ?Use night lights. ?Make sure  that you have a light by your bed that is easy to reach. ?Do not use any sheets or blankets that are too big for your bed. They should not hang down onto the floor. ?Have a firm chair that has side arms. You can use this for support while you get dressed. ?Do not have throw rugs and other things on the floor that can make you trip. ?What can I do in the kitchen? ?Clean up any spills right away. ?Avoid walking on wet floors. ?Keep items that you use a lot in easy-to-reach places. ?If you need to reach something above you, use a strong step stool that has a grab bar. ?Keep electrical cords out of the way. ?Do not use floor polish or wax that makes floors slippery. If you must use wax, use non-skid floor wax. ?Do not have throw rugs and other things on the floor that can make you trip. ?What can I do with my stairs? ?Do not leave any items on the stairs. ?Make sure that there are handrails on both sides of the stairs and use them. Fix handrails that are broken or loose. Make sure that handrails are as long as the stairways. ?Check any carpeting to make sure that it is firmly attached to the stairs. Fix any carpet that is loose or worn. ?Avoid having throw rugs at the top or bottom of the stairs. If you do have  throw rugs, attach them to the floor with carpet tape. ?Make sure that you have a light switch at the top of the stairs and the bottom of the stairs. If you do not have them, ask someone to add them for you. ?What else can I do to help prevent falls? ?Wear shoes that: ?Do not have high heels. ?Have rubber bottoms. ?Are comfortable and fit you well. ?Are closed at the toe. Do not wear sandals. ?If you use a stepladder: ?Make sure that it is fully opened. Do not climb a closed stepladder. ?Make sure that both sides of the stepladder are locked into place. ?Ask someone to hold it for you, if possible. ?Clearly mark and make sure that you can see: ?Any grab bars or handrails. ?First and last steps. ?Where the edge of  each step is. ?Use tools that help you move around (mobility aids) if they are needed. These include: ?Canes. ?Walkers. ?Scooters. ?Crutches. ?Turn on the lights when you go into a dark area. Replace any

## 2021-04-08 LAB — BASIC METABOLIC PANEL
BUN/Creatinine Ratio: 19 (ref 12–28)
BUN: 10 mg/dL (ref 8–27)
CO2: 26 mmol/L (ref 20–29)
Calcium: 9.6 mg/dL (ref 8.7–10.3)
Chloride: 98 mmol/L (ref 96–106)
Creatinine, Ser: 0.54 mg/dL — ABNORMAL LOW (ref 0.57–1.00)
Glucose: 90 mg/dL (ref 70–99)
Potassium: 4.1 mmol/L (ref 3.5–5.2)
Sodium: 136 mmol/L (ref 134–144)
eGFR: 95 mL/min/{1.73_m2} (ref >59)

## 2021-04-20 ENCOUNTER — Ambulatory Visit: Payer: Self-pay | Admitting: *Deleted

## 2021-04-20 NOTE — Telephone Encounter (Signed)
Reason for Disposition ? [1] Caller has medicine question about med NOT prescribed by PCP AND [2] triager unable to answer question (e.g., compatibility with other med, storage) ?   Suggestions for OTC allergy medication not raise her BP ? ?Answer Assessment - Initial Assessment Questions ?1. NAME of MEDICATION: "What medicine are you calling about?" ?    Need OTC suggestions for allergies that won't raise my BP. ?2. QUESTION: "What is your question?" (e.g., double dose of medicine, side effect) ?    I want suggestions what is compatible with my medications too ?3. PRESCRIBING HCP: "Who prescribed it?" Reason: if prescribed by specialist, call should be referred to that group. ?    N/A ?4. SYMPTOMS: "Do you have any symptoms?" ?    Seasonal allergies ?5. SEVERITY: If symptoms are present, ask "Are they mild, moderate or severe?" ?    N/A ?6. PREGNANCY:  "Is there any chance that you are pregnant?" "When was your last menstrual period?" ?    N/A ? ?Protocols used: Medication Question Call-A-AH ? ?

## 2021-04-20 NOTE — Telephone Encounter (Signed)
?  Chief Complaint: Requesting OTC medications she can take that won't raise her BP and are compatible with her medications for seasonal allergies. ?Symptoms: N/A ?Frequency: N/A ?Pertinent Negatives: Patient denies N/A ?Disposition: '[]'$ ED /'[]'$ Urgent Care (no appt availability in office) / '[]'$ Appointment(In office/virtual)/ '[]'$  [facility]/ '[x]'$ Home Care/ '[]'$ Refused Recommended Disposition /'[]'$ [facility]/ '[]'$  Follow-up with PCP ?Additional Notes:   ?

## 2021-04-21 ENCOUNTER — Other Ambulatory Visit: Payer: Self-pay | Admitting: Family Medicine

## 2021-04-21 DIAGNOSIS — Z20822 Contact with and (suspected) exposure to covid-19: Secondary | ICD-10-CM | POA: Diagnosis not present

## 2021-04-27 ENCOUNTER — Other Ambulatory Visit: Payer: Self-pay | Admitting: Family Medicine

## 2021-05-12 ENCOUNTER — Encounter: Payer: Medicare Other | Admitting: Family Medicine

## 2021-05-14 DIAGNOSIS — E7800 Pure hypercholesterolemia, unspecified: Secondary | ICD-10-CM | POA: Diagnosis not present

## 2021-05-14 DIAGNOSIS — M4185 Other forms of scoliosis, thoracolumbar region: Secondary | ICD-10-CM | POA: Diagnosis not present

## 2021-05-14 DIAGNOSIS — F418 Other specified anxiety disorders: Secondary | ICD-10-CM | POA: Diagnosis not present

## 2021-05-14 DIAGNOSIS — I10 Essential (primary) hypertension: Secondary | ICD-10-CM | POA: Diagnosis not present

## 2021-05-14 DIAGNOSIS — R636 Underweight: Secondary | ICD-10-CM | POA: Diagnosis not present

## 2021-05-14 DIAGNOSIS — M810 Age-related osteoporosis without current pathological fracture: Secondary | ICD-10-CM | POA: Diagnosis not present

## 2021-05-21 DIAGNOSIS — Z20822 Contact with and (suspected) exposure to covid-19: Secondary | ICD-10-CM | POA: Diagnosis not present

## 2021-05-27 ENCOUNTER — Telehealth: Payer: Self-pay | Admitting: Family Medicine

## 2021-05-27 DIAGNOSIS — I10 Essential (primary) hypertension: Secondary | ICD-10-CM

## 2021-05-27 MED ORDER — LISINOPRIL 40 MG PO TABS
ORAL_TABLET | ORAL | 0 refills | Status: DC
Start: 2021-05-27 — End: 2021-07-09

## 2021-05-27 NOTE — Telephone Encounter (Signed)
[facility] faxed refill request for the following medications:  lisinopril (ZESTRIL) 40 MG tablet   Please advise.  

## 2021-07-07 NOTE — Patient Instructions (Signed)
   The CDC recommends two doses of Shingrix (the shingles vaccine) separated by 2 to 6 months for adults age 77 years and older. I recommend checking with your insurance plan regarding coverage for this vaccine.   

## 2021-07-07 NOTE — Progress Notes (Signed)
I,Sulibeya S Dimas,acting as a Education administrator for [name], MD.,have documented all relevant documentation on the behalf of [name], MD,as directed by  [name], MD while in the presence of [name], MD.   Established patient visit   Patient: Amanda Tucker   DOB: [birth year]   75 y.o. Female  MRN: [id number] Visit Date: 07/09/2021  Today's healthcare provider: [name], MD   Chief Complaint  Patient presents with   Hypertension   Follow-up   Subjective    HPI  Hypertension, follow-up  BP Readings from Last 3 Encounters:  07/09/21 134/88  04/07/21 98/60  04/07/21 (!) 92/58   Wt Readings from Last 3 Encounters:  07/09/21 84 lb 9.6 oz (38.4 kg)  04/07/21 82 lb (37.2 kg)  04/07/21 82 lb 8 oz (37.4 kg)     She was last seen for hypertension 3 months ago.  BP at that visit was 98/60. Management since that visit includes Continue current meds - but hold amlodipine .  She reports excellent compliance with treatment. She is not having side effects.  She is following a Regular diet. She is exercising. She does smoke.  Outside blood pressures are not being checked. Symptoms: No chest pain No chest pressure  No palpitations No syncope  No dyspnea No orthopnea  No paroxysmal nocturnal dyspnea No lower extremity edema   Pertinent labs Lab Results  Component Value Date   CHOL 188 10/07/2020   HDL 70 10/07/2020   LDLCALC 103 (H) 10/07/2020   TRIG 83 10/07/2020   CHOLHDL 2.7 10/07/2020   Lab Results  Component Value Date   NA 136 04/07/2021   K 4.1 04/07/2021   CREATININE 0.54 (L) 04/07/2021   EGFR 95 04/07/2021   GLUCOSE 90 04/07/2021   TSH 0.71 12/14/2016     The 10-year ASCVD risk score (Arnett DK, et al., 2019) is: 34.4%  ---------------------------------------------------------------------------------------------------  Follow up for underweight  The patient was last seen for this 3 months ago. Changes made at last visit  include Discussed diet and exercise Patient will work on increasing calorie intake <10% weight loss Consider remeron in the future.  She reports excellent compliance with treatment. She feels that condition is Improved. She is not having side effects.   Wt Readings from Last 3 Encounters:  07/09/21 84 lb 9.6 oz (38.4 kg)  04/07/21 82 lb (37.2 kg)  04/07/21 82 lb 8 oz (37.4 kg)     -----------------------------------------------------------------------------------------  Medications: Outpatient Medications Prior to Visit  Medication Sig   aspirin EC 81 MG tablet Take 81 mg daily by mouth.   ibuprofen (ADVIL) 200 MG tablet Take 200 mg by mouth every 6 (six) hours as needed.   Multiple Vitamin (MULTIVITAMIN ADULT PO) Take by mouth daily.   omeprazole (PRILOSEC) 20 MG capsule Take 20 mg by mouth daily. Unsure of dose   simvastatin (ZOCOR) 40 MG tablet TAKE 1 TABLET(40 MG) BY MOUTH DAILY   VITAMIN D PO Take by mouth daily.   [DISCONTINUED] ALPRAZolam (XANAX) 0.5 MG tablet TAKE 1/2 TABLET BY MOUTH TWICE DAILY AS NEEDED FOR ANXIETY   [DISCONTINUED] lisinopril (ZESTRIL) 40 MG tablet TAKE 1 TABLET(40 MG) BY MOUTH DAILY   [DISCONTINUED] sertraline (ZOLOFT) 50 MG tablet TAKE 1 TABLET(50 MG) BY MOUTH DAILY   [DISCONTINUED] alendronate (FOSAMAX) 70 MG tablet Take 1 tablet (70 mg total) by mouth once a week. Take with a full glass of water on an empty stomach. (Patient not taking: Reported on 07/09/2021)   No  facility-administered medications prior to visit.    Review of Systems  Constitutional:  Positive for appetite change. Negative for fatigue.  Respiratory:  Negative for chest tightness and shortness of breath.   Cardiovascular:  Negative for chest pain, palpitations and leg swelling.  Gastrointestinal:  Negative for abdominal pain, diarrhea, nausea and vomiting.        Objective    BP 134/88 (BP Location: Left Arm, Patient Position: Sitting, Cuff Size: Small)   Pulse 69   Temp 97.6  F (36.4 C) (Oral)   Resp 16   Wt 84 lb 9.6 oz (38.4 kg)   BMI 17.20 kg/m  BP Readings from Last 3 Encounters:  07/09/21 134/88  04/07/21 98/60  04/07/21 (!) 92/58   Wt Readings from Last 3 Encounters:  07/09/21 84 lb 9.6 oz (38.4 kg)  04/07/21 82 lb (37.2 kg)  04/07/21 82 lb 8 oz (37.4 kg)      Physical Exam Vitals reviewed.  Constitutional:      General: She is not in acute distress.    Appearance: Normal appearance. She is well-developed. She is not diaphoretic.  HENT:     Head: Normocephalic and atraumatic.  Eyes:     General: No scleral icterus.    Conjunctiva/sclera: Conjunctivae normal.  Neck:     Thyroid: No thyromegaly.  Cardiovascular:     Rate and Rhythm: Normal rate and regular rhythm.     Pulses: Normal pulses.     Heart sounds: Normal heart sounds. No murmur heard. Pulmonary:     Effort: Pulmonary effort is normal. No respiratory distress.     Breath sounds: Normal breath sounds. No wheezing, rhonchi or rales.  Musculoskeletal:     Cervical back: Neck supple.     Right lower leg: No edema.     Left lower leg: No edema.  Lymphadenopathy:     Cervical: No cervical adenopathy.  Skin:    General: Skin is warm and dry.  Neurological:     Mental Status: She is alert and oriented to person, place, and time. Mental status is at baseline.  Psychiatric:        Mood and Affect: Mood normal.        Behavior: Behavior normal.       No results found for any visits on 07/09/21.  Assessment & Plan     Problem List Items Addressed This Visit       Cardiovascular and Mediastinum   Hypertension - Primary    Well controlled Continue current medications Amlodipine has been d/c'd Reviewed recent metabolic panel F/u in 6 months       Relevant Medications   lisinopril (ZESTRIL) 40 MG tablet     Musculoskeletal and Integument   Age-related osteoporosis without current pathological fracture    Chronic and uncontrolled Last DEXA had worsened while on  fosamax Will continue for now until she can see Endocrinology      Relevant Medications   alendronate (FOSAMAX) 70 MG tablet   Other Relevant Orders   Ambulatory referral to Endocrinology     Other   Moderate protein-calorie malnutrition ([place])    As above Working on higher calorie intake      Underweight    Discussed importance of healthy weight management Discussed diet and exercise She is working on eating more and higher calories Weight is increasing        Return in about 6 months (around 01/08/2022) for chronic disease f/u.      Lynford Citizen  Brita Romp, MD, have reviewed all documentation for this visit. The documentation on 07/09/21 for the exam, diagnosis, procedures, and orders are all accurate and complete.   Melynda Krzywicki, [name], MD, MPH [facility]

## 2021-07-09 ENCOUNTER — Encounter: Payer: Self-pay | Admitting: Family Medicine

## 2021-07-09 ENCOUNTER — Ambulatory Visit (INDEPENDENT_AMBULATORY_CARE_PROVIDER_SITE_OTHER): Payer: Medicare Other | Admitting: Family Medicine

## 2021-07-09 VITALS — BP 134/88 | HR 69 | Temp 97.6°F | Resp 16 | Wt 84.6 lb

## 2021-07-09 DIAGNOSIS — I10 Essential (primary) hypertension: Secondary | ICD-10-CM | POA: Diagnosis not present

## 2021-07-09 DIAGNOSIS — R636 Underweight: Secondary | ICD-10-CM | POA: Diagnosis not present

## 2021-07-09 DIAGNOSIS — M810 Age-related osteoporosis without current pathological fracture: Secondary | ICD-10-CM | POA: Diagnosis not present

## 2021-07-09 DIAGNOSIS — E440 Moderate protein-calorie malnutrition: Secondary | ICD-10-CM

## 2021-07-09 MED ORDER — SERTRALINE HCL 50 MG PO TABS: 50.0000 mg | ORAL_TABLET | Freq: Every day | ORAL | 1 refills | Status: DC

## 2021-07-09 MED ORDER — ALPRAZOLAM 0.5 MG PO TABS: 0.2500 mg | ORAL_TABLET | Freq: Two times a day (BID) | ORAL | 2 refills | Status: DC | PRN

## 2021-07-09 MED ORDER — LISINOPRIL 40 MG PO TABS: ORAL_TABLET | ORAL | 3 refills | Status: DC

## 2021-07-09 MED ORDER — ALENDRONATE SODIUM 70 MG PO TABS: 70.0000 mg | ORAL_TABLET | ORAL | 11 refills | Status: DC

## 2021-07-09 NOTE — Assessment & Plan Note (Signed)
Well controlled Continue current medications Amlodipine has been d/c'd Reviewed recent metabolic panel F/u in 6 months

## 2021-07-09 NOTE — Assessment & Plan Note (Signed)
As above Working on higher calorie intake

## 2021-07-09 NOTE — Assessment & Plan Note (Signed)
Chronic and uncontrolled Last DEXA had worsened while on fosamax Will continue for now until she can see Endocrinology

## 2021-07-09 NOTE — Assessment & Plan Note (Signed)
Discussed importance of healthy weight management Discussed diet and exercise She is working on eating more and higher calories Weight is increasing

## 2021-10-27 ENCOUNTER — Other Ambulatory Visit: Payer: Self-pay

## 2021-10-27 ENCOUNTER — Telehealth: Payer: Self-pay | Admitting: Family Medicine

## 2021-10-27 MED ORDER — ALPRAZOLAM 0.5 MG PO TABS: 0.2500 mg | ORAL_TABLET | Freq: Two times a day (BID) | ORAL | 1 refills | Status: DC | PRN

## 2021-10-27 NOTE — Telephone Encounter (Signed)
Patient advised prescription sent to pharmacy.

## 2021-10-27 NOTE — Telephone Encounter (Signed)
[facility] faxed refill request for the following medications:  ALPRAZolam (XANAX) 0.5 MG tablet   Please advise.

## 2021-10-27 NOTE — Telephone Encounter (Signed)
Pt called to check on refill request for Alprazolam / please advise

## 2021-12-07 IMAGING — CR DG CHEST 2V
2 series · 2 of 2 positions shown · non-contrast
Comparison: None.

CLINICAL DATA: Food impaction, difficulty swallowing

EXAM:
CHEST - 2 VIEW

[chest pa]
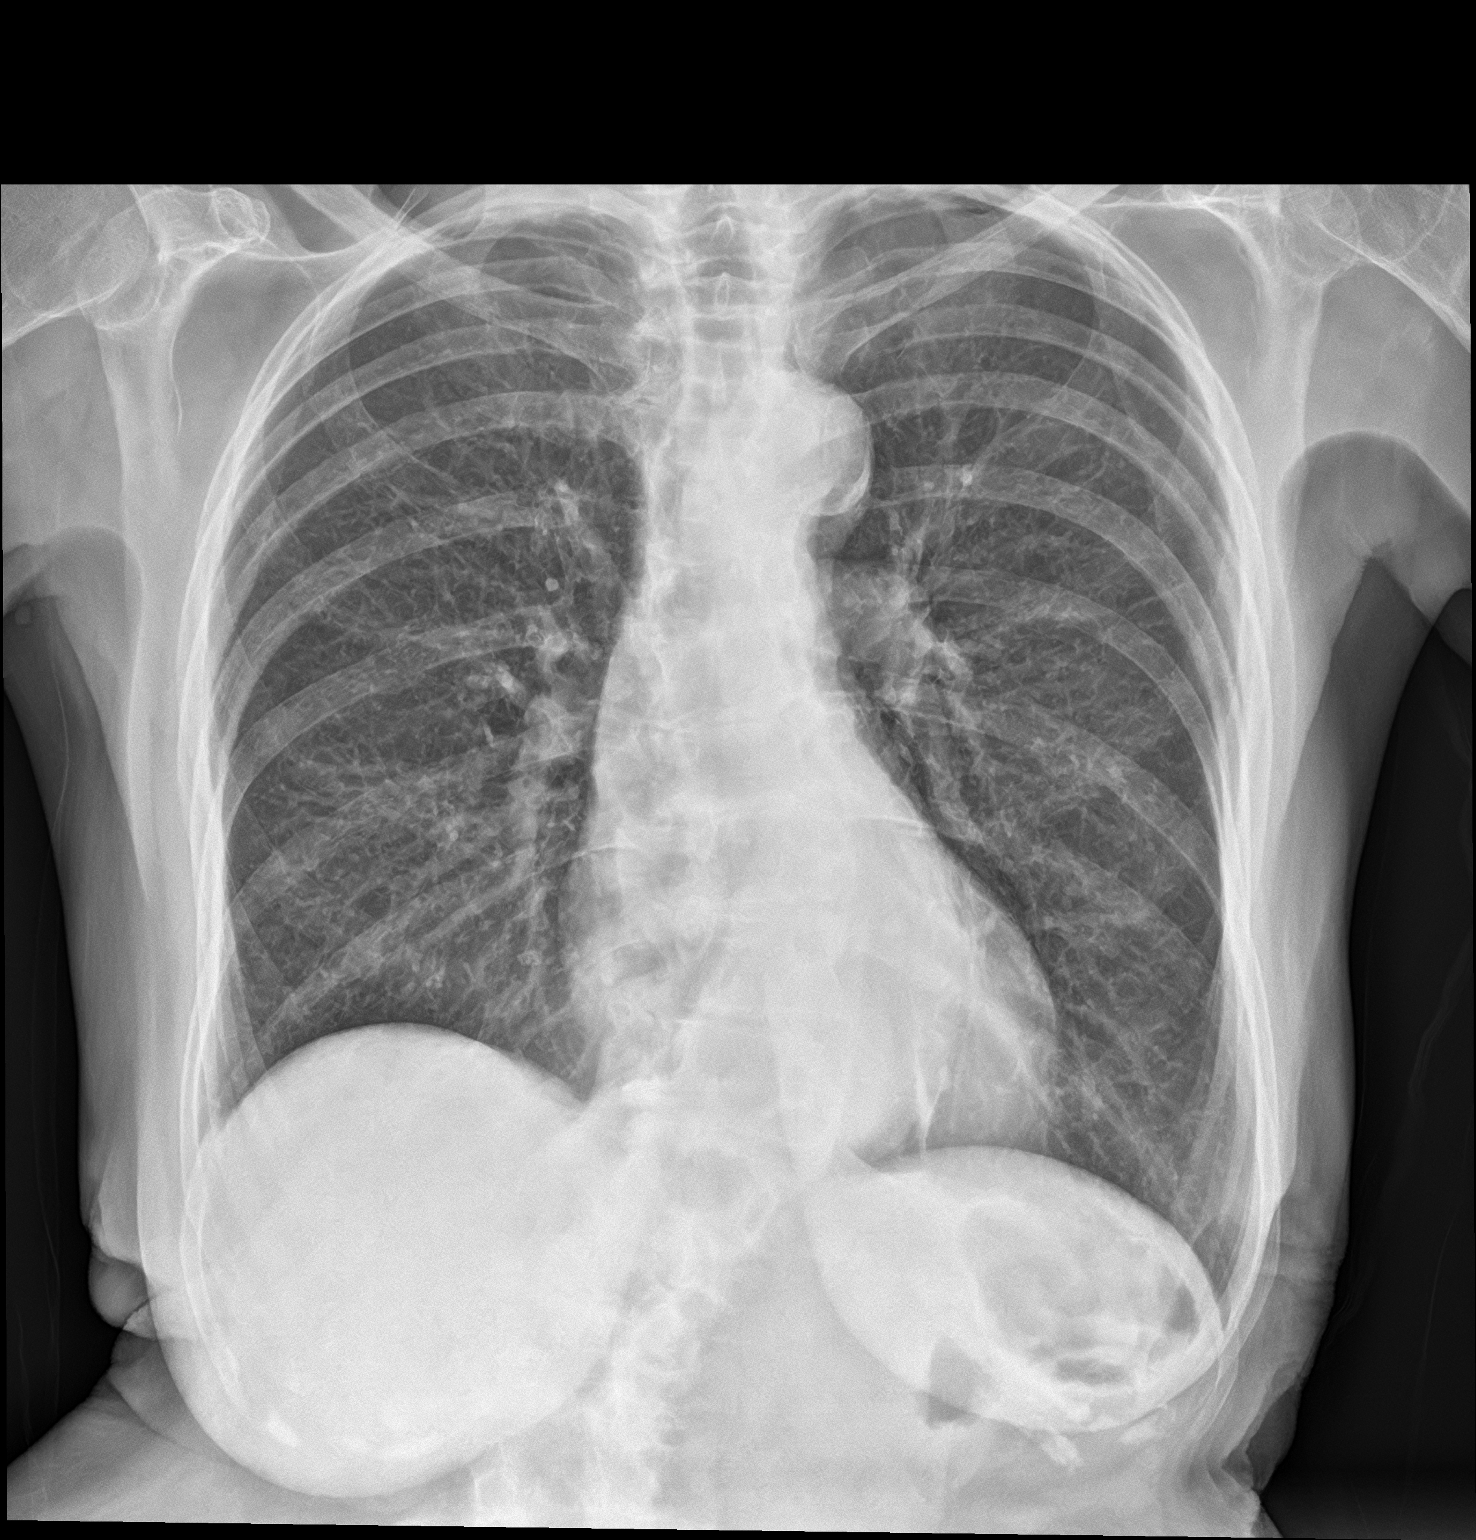

[chest lat]
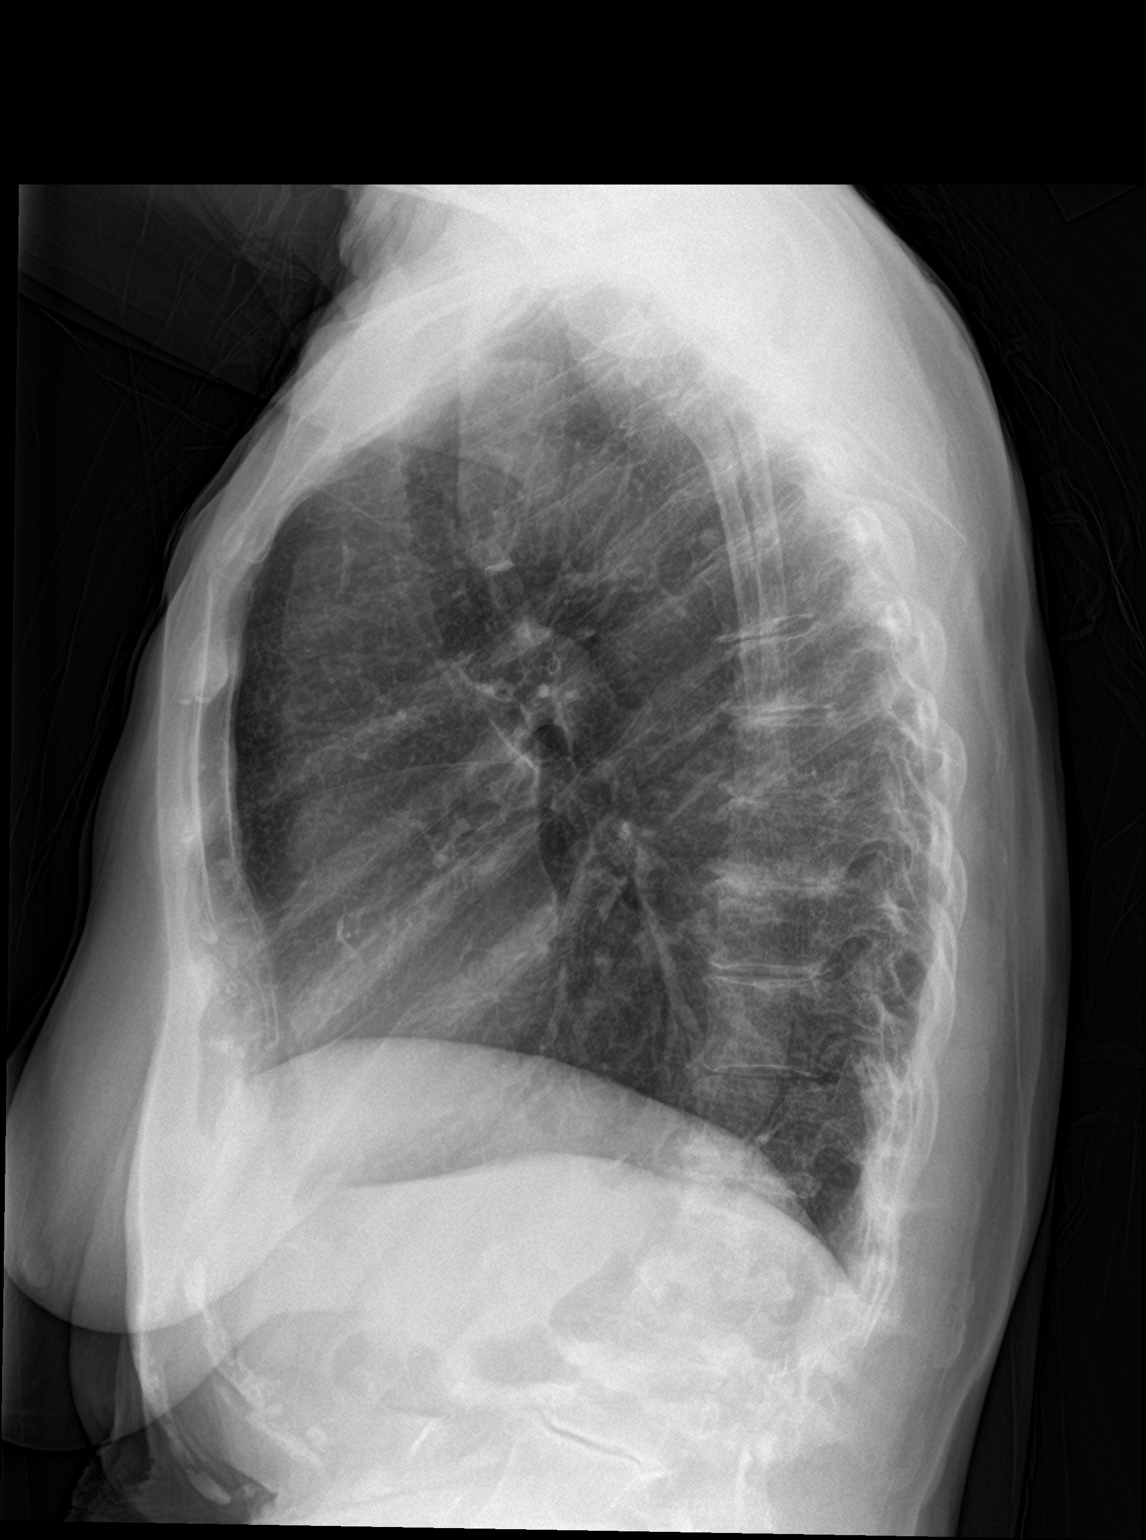

[2 of 2 positions shown; findings below may reference images not displayed]

FINDINGS: No focal consolidation, pneumothorax or pleural effusion.
Cardiomediastinal silhouette within normal limits. Aortic
atherosclerotic calcifications. No acute osseous abnormality. No
suspicious radiopaque foreign body.
IMPRESSION: No acute cardiopulmonary abnormality.

## 2021-12-28 ENCOUNTER — Other Ambulatory Visit: Payer: Self-pay | Admitting: Family Medicine

## 2022-01-07 NOTE — Progress Notes (Unsigned)
I,[name],acting as a Education administrator for Amanda Paganini, MD.,have documented all relevant documentation on the behalf of Amanda Paganini, MD,as directed by  Amanda Paganini, MD while in the presence of Amanda Paganini, MD.     Established patient visit   Patient: Amanda Tucker   DOB: 10/23/1945   76 y.o. Female  MRN: [id number] Visit Date: 01/11/2022  Today's healthcare provider: [name], MD   No chief complaint on file.  Subjective    HPI  Hypertension, follow-up  BP Readings from Last 3 Encounters:  07/09/21 134/88  04/07/21 98/60  04/07/21 (!) 92/58   Wt Readings from Last 3 Encounters:  07/09/21 84 lb 9.6 oz (38.4 kg)  04/07/21 82 lb (37.2 kg)  04/07/21 82 lb 8 oz (37.4 kg)     She was last seen for hypertension 6 months ago.  BP at that visit was 134/88. Management since that visit includes no changes. She reports {excellent/good/fair/poor:19665} compliance with treatment. She {is/is not:9024} having side effects. {document side effects if present:1}   Outside blood pressures are {enter patient reported home BP, or 'not being checked':1}.    --------------------------------------------------------------------------------------------------- Lipid/Cholesterol, follow-up  Last Lipid Panel: Lab Results  Component Value Date   CHOL 188 10/07/2020   LDLCALC 103 (H) 10/07/2020   HDL 70 10/07/2020   TRIG 83 10/07/2020    She was last seen for this 6 months ago.  Management since that visit includes no changes.  She reports {excellent/good/fair/poor:19665} compliance with treatment. She {is/is not:9024} having side effects. {document side effects if XTGGYIR:4}  Last metabolic panel Lab Results  Component Value Date   GLUCOSE 90 04/07/2021   NA 136 04/07/2021   K 4.1 04/07/2021   BUN 10 04/07/2021   CREATININE 0.54 (L) 04/07/2021   EGFR 95 04/07/2021   GFRNONAA >60 12/21/2019   CALCIUM 9.6 04/07/2021   AST 19 10/07/2020   ALT 15  10/07/2020   The 10-year ASCVD risk score (Arnett DK, et al., 2019) is: 37.4%  --------------------------------------------------------------------------------------------------- Follow up for weight  The patient was last seen for this 6 months ago. Changes made at last visit include increase calorie intake.  She reports {excellent/good/fair/poor:19665} compliance with treatment. She feels that condition is {improved/worse/unchanged:3041574}. She {is/is not:21021397} having side effects. ***  -----------------------------------------------------------------------------------------   Medications: Outpatient Medications Prior to Visit  Medication Sig   alendronate (FOSAMAX) 70 MG tablet Take 1 tablet (70 mg total) by mouth once a week. Take with a full glass of water on an empty stomach.   ALPRAZolam (XANAX) 0.5 MG tablet TAKE 1/2 TABLET(0.25 MG) BY MOUTH TWICE DAILY AS NEEDED FOR ANXIETY   aspirin EC 81 MG tablet Take 81 mg daily by mouth.   ibuprofen (ADVIL) 200 MG tablet Take 200 mg by mouth every 6 (six) hours as needed.   lisinopril (ZESTRIL) 40 MG tablet TAKE 1 TABLET(40 MG) BY MOUTH DAILY   Multiple Vitamin (MULTIVITAMIN ADULT PO) Take by mouth daily.   omeprazole (PRILOSEC) 20 MG capsule Take 20 mg by mouth daily. Unsure of dose   sertraline (ZOLOFT) 50 MG tablet Take 1 tablet (50 mg total) by mouth daily.   simvastatin (ZOCOR) 40 MG tablet TAKE 1 TABLET(40 MG) BY MOUTH DAILY   VITAMIN D PO Take by mouth daily.   No facility-administered medications prior to visit.    Review of Systems  Constitutional:  Negative for appetite change and fatigue.  Eyes:  Negative for visual disturbance.  Respiratory:  Negative for chest tightness.  Cardiovascular:  Negative for chest pain and leg swelling.  Gastrointestinal:  Negative for abdominal pain, nausea and vomiting.  Neurological:  Negative for dizziness, light-headedness and headaches.    {Labs  Heme  Chem  Endocrine   Serology  Results Review (optional):23779}   Objective    There were no vitals taken for this visit. BP Readings from Last 3 Encounters:  07/09/21 134/88  04/07/21 98/60  04/07/21 (!) 92/58   Wt Readings from Last 3 Encounters:  07/09/21 84 lb 9.6 oz (38.4 kg)  04/07/21 82 lb (37.2 kg)  04/07/21 82 lb 8 oz (37.4 kg)      Physical Exam  ***  No results found for any visits on 01/11/22.  Assessment & Plan     ***  No follow-ups on file.      {provider attestation***:1}   Amanda Paganini, MD  [facility] [phone] (phone) [phone] (fax)  [place]

## 2022-01-11 ENCOUNTER — Ambulatory Visit (INDEPENDENT_AMBULATORY_CARE_PROVIDER_SITE_OTHER): Payer: Medicare Other | Admitting: Family Medicine

## 2022-01-11 ENCOUNTER — Encounter: Payer: Self-pay | Admitting: Family Medicine

## 2022-01-11 VITALS — BP 137/89 | HR 79 | Temp 97.9°F | Resp 16 | Wt 79.4 lb

## 2022-01-11 DIAGNOSIS — R636 Underweight: Secondary | ICD-10-CM

## 2022-01-11 DIAGNOSIS — E782 Mixed hyperlipidemia: Secondary | ICD-10-CM

## 2022-01-11 DIAGNOSIS — F411 Generalized anxiety disorder: Secondary | ICD-10-CM | POA: Diagnosis not present

## 2022-01-11 DIAGNOSIS — I10 Essential (primary) hypertension: Secondary | ICD-10-CM

## 2022-01-11 DIAGNOSIS — E440 Moderate protein-calorie malnutrition: Secondary | ICD-10-CM

## 2022-01-11 DIAGNOSIS — Z23 Encounter for immunization: Secondary | ICD-10-CM

## 2022-01-11 DIAGNOSIS — M810 Age-related osteoporosis without current pathological fracture: Secondary | ICD-10-CM | POA: Diagnosis not present

## 2022-01-11 DIAGNOSIS — Z1231 Encounter for screening mammogram for malignant neoplasm of breast: Secondary | ICD-10-CM | POA: Diagnosis not present

## 2022-01-11 NOTE — Assessment & Plan Note (Signed)
As above Add ensure Check labs Update mammogram screening

## 2022-01-11 NOTE — Assessment & Plan Note (Signed)
Well controlled Continue current medications Recheck metabolic panel 

## 2022-01-11 NOTE — Assessment & Plan Note (Signed)
Discussed importance of healthy weight management Discussed diet and exercise  Will try to add high calorie foods Trial of ensure supplement No swallowing difficulty Reviewed EGD and colonoscopy Needs to get mammo to update cancer screenings (hesitant but states she will schedule) Labs to r/o any underlying issue

## 2022-01-11 NOTE — Assessment & Plan Note (Signed)
Previously well controlled Continue statin Repeat FLP and CMP  

## 2022-01-11 NOTE — Assessment & Plan Note (Signed)
Upcoming Endo appt to discuss other options for treatment  Recheck Vit D

## 2022-01-11 NOTE — Assessment & Plan Note (Signed)
Chronic and well controlled Continue current medications

## 2022-01-12 ENCOUNTER — Telehealth: Payer: Self-pay | Admitting: Family Medicine

## 2022-01-12 MED ORDER — SERTRALINE HCL 50 MG PO TABS: 50.0000 mg | ORAL_TABLET | Freq: Every day | ORAL | 1 refills | Status: DC

## 2022-01-12 NOTE — Telephone Encounter (Signed)
[facility] faxed refill request for the following medications:    sertraline (ZOLOFT) 50 MG tablet   Please advise

## 2022-02-11 DIAGNOSIS — M810 Age-related osteoporosis without current pathological fracture: Secondary | ICD-10-CM | POA: Diagnosis not present

## 2022-02-11 DIAGNOSIS — E782 Mixed hyperlipidemia: Secondary | ICD-10-CM | POA: Diagnosis not present

## 2022-02-11 DIAGNOSIS — I10 Essential (primary) hypertension: Secondary | ICD-10-CM | POA: Diagnosis not present

## 2022-02-11 DIAGNOSIS — R636 Underweight: Secondary | ICD-10-CM | POA: Diagnosis not present

## 2022-02-11 DIAGNOSIS — E440 Moderate protein-calorie malnutrition: Secondary | ICD-10-CM | POA: Diagnosis not present

## 2022-02-12 LAB — CBC WITH DIFFERENTIAL/PLATELET
Basophils Absolute: 0 10*3/uL (ref 0.0–0.2)
Basos: 1 %
EOS (ABSOLUTE): 0.1 10*3/uL (ref 0.0–0.4)
Eos: 2 %
Hematocrit: 37.6 % (ref 34.0–46.6)
Hemoglobin: 12.8 g/dL (ref 11.1–15.9)
Immature Grans (Abs): 0 10*3/uL (ref 0.0–0.1)
Immature Granulocytes: 0 %
Lymphocytes Absolute: 1.2 10*3/uL (ref 0.7–3.1)
Lymphs: 23 %
MCH: 32.2 pg (ref 26.6–33.0)
MCHC: 34 g/dL (ref 31.5–35.7)
MCV: 95 fL (ref 79–97)
Monocytes Absolute: 0.4 10*3/uL (ref 0.1–0.9)
Monocytes: 7 %
Neutrophils Absolute: 3.4 10*3/uL (ref 1.4–7.0)
Neutrophils: 67 %
Platelets: 192 10*3/uL (ref 150–450)
RBC: 3.98 x10E6/uL (ref 3.77–5.28)
RDW: 12.3 % (ref 11.7–15.4)
WBC: 5.1 10*3/uL (ref 3.4–10.8)

## 2022-02-12 LAB — LIPID PANEL
Chol/HDL Ratio: 2.1 ratio (ref 0.0–4.4)
Cholesterol, Total: 171 mg/dL (ref 100–199)
HDL: 80 mg/dL (ref >39)
LDL Chol Calc (NIH): 74 mg/dL (ref 0–99)
Triglycerides: 93 mg/dL (ref 0–149)
VLDL Cholesterol Cal: 17 mg/dL (ref 5–40)

## 2022-02-12 LAB — TSH: TSH: 1.63 u[IU]/mL (ref 0.450–4.500)

## 2022-02-12 LAB — COMPREHENSIVE METABOLIC PANEL
ALT: 17 IU/L (ref 0–32)
AST: 21 IU/L (ref 0–40)
Albumin/Globulin Ratio: 1.8 (ref 1.2–2.2)
Albumin: 4.5 g/dL (ref 3.8–4.8)
Alkaline Phosphatase: 64 IU/L (ref 44–121)
BUN/Creatinine Ratio: 13 (ref 12–28)
BUN: 7 mg/dL — ABNORMAL LOW (ref 8–27)
Bilirubin Total: 0.5 mg/dL (ref 0.0–1.2)
CO2: 23 mmol/L (ref 20–29)
Calcium: 9.2 mg/dL (ref 8.7–10.3)
Chloride: 100 mmol/L (ref 96–106)
Creatinine, Ser: 0.56 mg/dL — ABNORMAL LOW (ref 0.57–1.00)
Globulin, Total: 2.5 g/dL (ref 1.5–4.5)
Glucose: 96 mg/dL (ref 70–99)
Potassium: 3.9 mmol/L (ref 3.5–5.2)
Sodium: 138 mmol/L (ref 134–144)
Total Protein: 7 g/dL (ref 6.0–8.5)
eGFR: 94 mL/min/{1.73_m2} (ref >59)

## 2022-02-12 LAB — VITAMIN D 25 HYDROXY (VIT D DEFICIENCY, FRACTURES): Vit D, 25-Hydroxy: 48.3 ng/mL (ref 30.0–100.0)

## 2022-04-05 ENCOUNTER — Telehealth: Payer: Self-pay | Admitting: Family Medicine

## 2022-04-05 NOTE — Telephone Encounter (Unsigned)
Copied from [place]. Topic: Appointment Scheduling - Scheduling Inquiry for Clinic >> Apr 05, 2022 11:23 AM Penni Bombard wrote: Reason for CRM: pt could not come to the [facility] on the [facility]  she needs another date.  CB#  [phone]

## 2022-04-12 ENCOUNTER — Ambulatory Visit (INDEPENDENT_AMBULATORY_CARE_PROVIDER_SITE_OTHER): Payer: PRIVATE HEALTH INSURANCE

## 2022-04-12 VITALS — BP 118/68 | Ht <= 58 in | Wt 80.6 lb

## 2022-04-12 DIAGNOSIS — Z0000 Encounter for general adult medical examination without abnormal findings: Secondary | ICD-10-CM

## 2022-04-12 DIAGNOSIS — Z13820 Encounter for screening for osteoporosis: Secondary | ICD-10-CM
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
## 2022-04-12 NOTE — Patient Instructions (Signed)
[name] , Thank you for taking time to come for your Medicare Wellness Visit. I appreciate your ongoing commitment to your health goals. Please review the following plan we discussed and let me know if I can assist you in the future.   These are the goals we discussed:  Goals      DIET - EAT MORE FRUITS AND VEGETABLES     Quit Smoking     Recommend to continue efforts to reduce smoking habits until no longer smoking (Smoking Cessation literature attached to AVS).          This is a list of the screening recommended for you and due dates:  Health Maintenance  Topic Date Due   DTaP/Tdap/Td vaccine (1 - Tdap) Never done   Zoster (Shingles) Vaccine (1 of 2) Never done   DEXA scan (bone density measurement)  05/28/2022   Colon Cancer Screening  02/28/2023   Medicare Annual Wellness Visit  04/12/2023   Pneumonia Vaccine  Completed   Flu Shot  Completed   COVID-19 Vaccine  Completed   Hepatitis C Screening: USPSTF Recommendation to screen - Ages 18-79 yo.  Completed   HPV Vaccine  Aged Out    Advanced directives: no  Conditions/risks identified: none  Next appointment: Follow up in one year for your annual wellness visit 04/13/2023 @ 1pm in person   Preventive Care 65 Years and Older, Female Preventive care refers to lifestyle choices and visits with your health care provider that can promote health and wellness. What does preventive care include? A yearly physical exam. This is also called an annual well check. Dental exams once or twice a year. Routine eye exams. Ask your health care provider how often you should have your eyes checked. Personal lifestyle choices, including: Daily care of your teeth and gums. Regular physical activity. Eating a healthy diet. Avoiding tobacco and drug use. Limiting alcohol use. Practicing safe sex. Taking low-dose aspirin every day. Taking vitamin and mineral supplements as recommended by your health care provider. What happens during  an annual well check? The services and screenings done by your health care provider during your annual well check will depend on your age, overall health, lifestyle risk factors, and family history of disease. Counseling  Your health care provider may ask you questions about your: Alcohol use. Tobacco use. Drug use. Emotional well-being. Home and relationship well-being. Sexual activity. Eating habits. History of falls. Memory and ability to understand (cognition). Work and work Statistician. Reproductive health. Screening  You may have the following tests or measurements: Height, weight, and BMI. Blood pressure. Lipid and cholesterol levels. These may be checked every 5 years, or more frequently if you are over 79 years old. Skin check. Lung cancer screening. You may have this screening every year starting at age 59 if you have a 30-pack-year history of smoking and currently smoke or have quit within the past 15 years. Fecal occult blood test (FOBT) of the stool. You may have this test every year starting at age 73. Flexible sigmoidoscopy or colonoscopy. You may have a sigmoidoscopy every 5 years or a colonoscopy every 10 years starting at age 32. Hepatitis C blood test. Hepatitis B blood test. Sexually transmitted disease (STD) testing. Diabetes screening. This is done by checking your blood sugar (glucose) after you have not eaten for a while (fasting). You may have this done every 1-3 years. Bone density scan. This is done to screen for osteoporosis. You may have this done starting at age  65. Mammogram. This may be done every 1-2 years. Talk to your health care provider about how often you should have regular mammograms. Talk with your health care provider about your test results, treatment options, and if necessary, the need for more tests. Vaccines  Your health care provider may recommend certain vaccines, such as: Influenza vaccine. This is recommended every year. Tetanus,  diphtheria, and acellular pertussis (Tdap, Td) vaccine. You may need a Td booster every 10 years. Zoster vaccine. You may need this after age 51. Pneumococcal 13-valent conjugate (PCV13) vaccine. One dose is recommended after age 72. Pneumococcal polysaccharide (PPSV23) vaccine. One dose is recommended after age 76. Talk to your health care provider about which screenings and vaccines you need and how often you need them. This information is not intended to replace advice given to you by your health care provider. Make sure you discuss any questions you have with your health care provider. Document Released: 02/07/2015 Document Revised: 10/01/2015 Document Reviewed: 11/12/2014 Elsevier Interactive Patient Education  2017 [place] Prevention in the Home Falls can cause injuries. They can happen to people of all ages. There are many things you can do to make your home safe and to help prevent falls. What can I do on the outside of my home? Regularly fix the edges of walkways and driveways and fix any cracks. Remove anything that might make you trip as you walk through a door, such as a raised step or threshold. Trim any bushes or trees on the path to your home. Use bright outdoor lighting. Clear any walking paths of anything that might make someone trip, such as rocks or tools. Regularly check to see if handrails are loose or broken. Make sure that both sides of any steps have handrails. Any raised decks and porches should have guardrails on the edges. Have any leaves, snow, or ice cleared regularly. Use sand or salt on walking paths during winter. Clean up any spills in your garage right away. This includes oil or grease spills. What can I do in the bathroom? Use night lights. Install grab bars by the toilet and in the tub and shower. Do not use towel bars as grab bars. Use non-skid mats or decals in the tub or shower. If you need to sit down in the shower, use a plastic,  non-slip stool. Keep the floor dry. Clean up any water that spills on the floor as soon as it happens. Remove soap buildup in the tub or shower regularly. Attach bath mats securely with double-sided non-slip rug tape. Do not have throw rugs and other things on the floor that can make you trip. What can I do in the bedroom? Use night lights. Make sure that you have a light by your bed that is easy to reach. Do not use any sheets or blankets that are too big for your bed. They should not hang down onto the floor. Have a firm chair that has side arms. You can use this for support while you get dressed. Do not have throw rugs and other things on the floor that can make you trip. What can I do in the kitchen? Clean up any spills right away. Avoid walking on wet floors. Keep items that you use a lot in easy-to-reach places. If you need to reach something above you, use a strong step stool that has a grab bar. Keep electrical cords out of the way. Do not use floor polish or wax that makes floors slippery.  If you must use wax, use non-skid floor wax. Do not have throw rugs and other things on the floor that can make you trip. What can I do with my stairs? Do not leave any items on the stairs. Make sure that there are handrails on both sides of the stairs and use them. Fix handrails that are broken or loose. Make sure that handrails are as long as the stairways. Check any carpeting to make sure that it is firmly attached to the stairs. Fix any carpet that is loose or worn. Avoid having throw rugs at the top or bottom of the stairs. If you do have throw rugs, attach them to the floor with carpet tape. Make sure that you have a light switch at the top of the stairs and the bottom of the stairs. If you do not have them, ask someone to add them for you. What else can I do to help prevent falls? Wear shoes that: Do not have high heels. Have rubber bottoms. Are comfortable and fit you well. Are closed  at the toe. Do not wear sandals. If you use a stepladder: Make sure that it is fully opened. Do not climb a closed stepladder. Make sure that both sides of the stepladder are locked into place. Ask someone to hold it for you, if possible. Clearly mark and make sure that you can see: Any grab bars or handrails. First and last steps. Where the edge of each step is. Use tools that help you move around (mobility aids) if they are needed. These include: Canes. Walkers. Scooters. Crutches. Turn on the lights when you go into a dark area. Replace any light bulbs as soon as they burn out. Set up your furniture so you have a clear path. Avoid moving your furniture around. If any of your floors are uneven, fix them. If there are any pets around you, be aware of where they are. Review your medicines with your doctor. Some medicines can make you feel dizzy. This can increase your chance of falling. Ask your doctor what other things that you can do to help prevent falls. This information is not intended to replace advice given to you by your health care provider. Make sure you discuss any questions you have with your health care provider. Document Released: 11/07/2008 Document Revised: 06/19/2015 Document Reviewed: 02/15/2014 Elsevier Interactive Patient Education  2017 [employer].

## 2022-04-19 ENCOUNTER — Ambulatory Visit: Payer: Medicare Other | Admitting: Family Medicine

## 2022-04-22 ENCOUNTER — Encounter: Payer: Self-pay | Admitting: Family Medicine

## 2022-04-22 ENCOUNTER — Ambulatory Visit (INDEPENDENT_AMBULATORY_CARE_PROVIDER_SITE_OTHER): Payer: Medicare Other | Admitting: Family Medicine

## 2022-04-22 VITALS — BP 102/76 | HR 80 | Temp 97.6°F | Resp 20 | Ht <= 58 in | Wt 81.1 lb

## 2022-04-22 DIAGNOSIS — M810 Age-related osteoporosis without current pathological fracture: Secondary | ICD-10-CM

## 2022-04-22 DIAGNOSIS — R636 Underweight: Secondary | ICD-10-CM

## 2022-04-22 DIAGNOSIS — I10 Essential (primary) hypertension: Secondary | ICD-10-CM

## 2022-04-22 DIAGNOSIS — F411 Generalized anxiety disorder: Secondary | ICD-10-CM | POA: Diagnosis not present

## 2022-04-22 DIAGNOSIS — E440 Moderate protein-calorie malnutrition: Secondary | ICD-10-CM

## 2022-04-22 DIAGNOSIS — E782 Mixed hyperlipidemia: Secondary | ICD-10-CM | POA: Diagnosis not present

## 2022-04-22 DIAGNOSIS — G4700 Insomnia, unspecified: Secondary | ICD-10-CM

## 2022-04-22 MED ORDER — ALENDRONATE SODIUM 70 MG PO TABS: 70.0000 mg | ORAL_TABLET | ORAL | 11 refills | Status: DC

## 2022-04-22 MED ORDER — LISINOPRIL 40 MG PO TABS: ORAL_TABLET | ORAL | 3 refills | Status: DC

## 2022-04-22 MED ORDER — SERTRALINE HCL 50 MG PO TABS: 50.0000 mg | ORAL_TABLET | Freq: Every day | ORAL | 3 refills | Status: DC

## 2022-04-22 MED ORDER — SIMVASTATIN 40 MG PO TABS: 40.0000 mg | ORAL_TABLET | Freq: Every day | ORAL | 3 refills | Status: DC

## 2022-04-22 MED ORDER — ALPRAZOLAM 0.5 MG PO TABS: ORAL_TABLET | ORAL | 5 refills | Status: DC

## 2022-04-22 NOTE — Assessment & Plan Note (Signed)
Chronic and stable  Continue current meds

## 2022-04-22 NOTE — Assessment & Plan Note (Signed)
Weight is improving Continue to work on diet

## 2022-04-22 NOTE — Assessment & Plan Note (Signed)
Weight is increasing Continue working on diet

## 2022-04-22 NOTE — Assessment & Plan Note (Signed)
Cancelled f/u appt with Endo due to unable to afford Prolia that was recommended Doing more weight bearing exercise Taking Ca/Vit D

## 2022-04-22 NOTE — Assessment & Plan Note (Signed)
Chronic and well controlled Continue current meds Have discussed risks of long term benzos, esp in the elderly

## 2022-04-22 NOTE — Progress Notes (Signed)
Amanda Tucker,Amanda Tucker,acting as a Education administrator for Amanda Paganini, MD.,have documented all relevant documentation on the behalf of Amanda Paganini, MD,as directed by  Amanda Paganini, MD while in the presence of Amanda Paganini, MD.     Established patient visit   Patient: Amanda Tucker   DOB: 11-29-1945   77 y.o. Female  MRN: [id number] Visit Date: 04/22/2022  Today's healthcare provider: [name], MD   Chief Complaint  Patient presents with   Hypertension   Hyperlipidemia   Anxiety   Subjective    HPI  Hypertension, follow-up  BP Readings from Last 3 Encounters:  04/22/22 102/76  04/12/22 118/68  01/11/22 137/89   Wt Readings from Last 3 Encounters:  04/22/22 81 lb 1.6 oz (36.8 kg)  04/12/22 80 lb 9.6 oz (36.6 kg)  01/11/22 79 lb 6.4 oz (36 kg)     She was last seen for hypertension 3 months ago.  BP at that visit was 137/89. Management since that visit includes no changes.  She reports excellent compliance with treatment. She is not having side effects.   Use of agents associated with hypertension: none.   Outside blood pressures are not being checked.  Pertinent labs Lab Results  Component Value Date   CHOL 171 02/11/2022   HDL 80 02/11/2022   LDLCALC 74 02/11/2022   TRIG 93 02/11/2022   CHOLHDL 2.1 02/11/2022   Lab Results  Component Value Date   NA 138 02/11/2022   K 3.9 02/11/2022   CREATININE 0.56 (L) 02/11/2022   EGFR 94 02/11/2022   GLUCOSE 96 02/11/2022   TSH 1.630 02/11/2022     The 10-year ASCVD risk score (Arnett DK, et al., 2019) is: 23.9%  --------------------------------------------------------------------------------------------------- Lipid/Cholesterol, Follow-up  Last lipid panel Other pertinent labs  Lab Results  Component Value Date   CHOL 171 02/11/2022   HDL 80 02/11/2022   LDLCALC 74 02/11/2022   TRIG 93 02/11/2022   CHOLHDL 2.1 02/11/2022   Lab Results  Component Value Date   ALT 17 02/11/2022   AST 21  02/11/2022   PLT 192 02/11/2022   TSH 1.630 02/11/2022     She was last seen for this 3 months ago.  Management since that visit includes no changes.  She reports excellent compliance with treatment. She is not having side effects.   The 10-year ASCVD risk score (Arnett DK, et al., 2019) is: 23.9%  --------------------------------------------------------------------------------------------------- Anxiety, Follow-up  She was last seen for anxiety 3 months ago. Changes made at last visit include no changes. Continue Xanax 0.5 mg 1/2 tablet BID PRN. Sertraline 50 mg daily.   She reports excellent compliance with treatment. She reports taking Xanax 1/2 tablet at least 3 times a week.  She reports excellent tolerance of treatment. She is not having side effects.   She feels her anxiety is mild and Unchanged since last visit.  GAD-7 Results    04/22/2022    9:49 AM 10/07/2020    9:59 AM 04/01/2020   10:36 AM  GAD-7 Generalized Anxiety Disorder Screening Tool  1. Feeling Nervous, Anxious, or on Edge 1 1 1   2. Not Being Able to Stop or Control Worrying 1 0 2  3. Worrying Too Much About Different Things 1 1 1   4. Trouble Relaxing 0 0 1  5. Being So Restless it's Hard To Sit Still 0 0 0  6. Becoming Easily Annoyed or Irritable 0 0 0  7. Feeling Afraid As If Something Awful Might Happen 3 1 2  Total GAD-7 Score 6 3 7   Difficulty At Work, Home, or Getting  Along With Others? Not difficult at all Not difficult at all Not difficult at all    PHQ-9 Scores    04/22/2022    9:48 AM 04/12/2022    1:12 PM 01/11/2022    8:41 AM  PHQ9 SCORE ONLY  PHQ-9 Total Score 3 0 2    --------------------------------------------------------------------------------------------------- Mild stress urinary incontinence - not very borthersome. Plans to do more kegels.  Medications: Outpatient Medications Prior to Visit  Medication Sig   aspirin EC 81 MG tablet Take 81 mg daily by mouth.   ibuprofen  (ADVIL) 200 MG tablet Take 200 mg by mouth every 6 (six) hours as needed.   Multiple Vitamin (MULTIVITAMIN ADULT PO) Take by mouth daily.   omeprazole (PRILOSEC) 20 MG capsule Take 20 mg by mouth daily. Unsure of dose   VITAMIN D PO Take by mouth daily.   [DISCONTINUED] alendronate (FOSAMAX) 70 MG tablet Take 1 tablet (70 mg total) by mouth once a week. Take with a full glass of water on an empty stomach.   [DISCONTINUED] ALPRAZolam (XANAX) 0.5 MG tablet TAKE 1/2 TABLET(0.25 MG) BY MOUTH TWICE DAILY AS NEEDED FOR ANXIETY   [DISCONTINUED] lisinopril (ZESTRIL) 40 MG tablet TAKE 1 TABLET(40 MG) BY MOUTH DAILY   [DISCONTINUED] sertraline (ZOLOFT) 50 MG tablet Take 1 tablet (50 mg total) by mouth daily.   [DISCONTINUED] simvastatin (ZOCOR) 40 MG tablet TAKE 1 TABLET(40 MG) BY MOUTH DAILY   No facility-administered medications prior to visit.    Review of Systems  Constitutional:  Positive for fatigue. Negative for appetite change, chills and fever.  Respiratory:  Negative for cough, chest tightness and shortness of breath.   Cardiovascular:  Negative for chest pain and leg swelling.  Gastrointestinal:  Negative for abdominal pain, nausea and vomiting.  Psychiatric/Behavioral:  Positive for sleep disturbance. The patient is nervous/anxious.     Last CBC Lab Results  Component Value Date   WBC 5.1 02/11/2022   HGB 12.8 02/11/2022   HCT 37.6 02/11/2022   MCV 95 02/11/2022   MCH 32.2 02/11/2022   RDW 12.3 02/11/2022   PLT 192 0000000   Last metabolic panel Lab Results  Component Value Date   GLUCOSE 96 02/11/2022   NA 138 02/11/2022   K 3.9 02/11/2022   CL 100 02/11/2022   CO2 23 02/11/2022   BUN 7 (L) 02/11/2022   CREATININE 0.56 (L) 02/11/2022   EGFR 94 02/11/2022   CALCIUM 9.2 02/11/2022   PROT 7.0 02/11/2022   ALBUMIN 4.5 02/11/2022   LABGLOB 2.5 02/11/2022   AGRATIO 1.8 02/11/2022   BILITOT 0.5 02/11/2022   ALKPHOS 64 02/11/2022   AST 21 02/11/2022   ALT 17  02/11/2022   ANIONGAP 13 12/21/2019   Last lipids Lab Results  Component Value Date   CHOL 171 02/11/2022   HDL 80 02/11/2022   LDLCALC 74 02/11/2022   TRIG 93 02/11/2022   CHOLHDL 2.1 02/11/2022   Last thyroid functions Lab Results  Component Value Date   TSH 1.630 02/11/2022   Last vitamin D Lab Results  Component Value Date   VD25OH 48.3 02/11/2022       Objective    BP 102/76 (BP Location: Right Arm, Patient Position: Sitting, Cuff Size: Small)   Pulse 80   Temp 97.6 F (36.4 C) (Temporal)   Resp 20   Ht 4\' 8"  (1.422 m)   Wt 81 lb 1.6 oz (36.8  kg)   SpO2 98%   BMI 18.18 kg/m  BP Readings from Last 3 Encounters:  04/22/22 102/76  04/12/22 118/68  01/11/22 137/89   Wt Readings from Last 3 Encounters:  04/22/22 81 lb 1.6 oz (36.8 kg)  04/12/22 80 lb 9.6 oz (36.6 kg)  01/11/22 79 lb 6.4 oz (36 kg)     Physical Exam Vitals reviewed.  Constitutional:      General: She is not in acute distress.    Appearance: Normal appearance. She is well-developed. She is not diaphoretic.  HENT:     Head: Normocephalic and atraumatic.  Eyes:     General: No scleral icterus.    Conjunctiva/sclera: Conjunctivae normal.  Neck:     Thyroid: No thyromegaly.  Cardiovascular:     Rate and Rhythm: Normal rate and regular rhythm.     Pulses: Normal pulses.     Heart sounds: Normal heart sounds. No murmur heard. Pulmonary:     Effort: Pulmonary effort is normal. No respiratory distress.     Breath sounds: Normal breath sounds. No wheezing, rhonchi or rales.  Musculoskeletal:     Cervical back: Neck supple.     Right lower leg: No edema.     Left lower leg: No edema.  Lymphadenopathy:     Cervical: No cervical adenopathy.  Skin:    General: Skin is warm and dry.     Findings: No rash.  Neurological:     Mental Status: She is alert and oriented to person, place, and time. Mental status is at baseline.  Psychiatric:        Mood and Affect: Mood normal.        Behavior:  Behavior normal.       No results found for any visits on 04/22/22.  Assessment & Plan     Problem List Items Addressed This Visit       Cardiovascular and Mediastinum   Hypertension - Primary    Well controlled Continue current medications Reviewed metabolic panel F/u in 6 months       Relevant Medications   lisinopril (ZESTRIL) 40 MG tablet   simvastatin (ZOCOR) 40 MG tablet     Musculoskeletal and Integument   Age-related osteoporosis without current pathological fracture    Cancelled f/u appt with Endo due to unable to afford Prolia that was recommended Doing more weight bearing exercise Taking Ca/Vit D      Relevant Medications   alendronate (FOSAMAX) 70 MG tablet     Other   Hyperlipidemia    Well controlled on recent labs Continue statin Repeat FLP and CMP annually      Relevant Medications   lisinopril (ZESTRIL) 40 MG tablet   simvastatin (ZOCOR) 40 MG tablet   Generalized anxiety disorder    Chronic and well controlled Continue current meds Have discussed risks of long term benzos, esp in the elderly      Relevant Medications   ALPRAZolam (XANAX) 0.5 MG tablet   sertraline (ZOLOFT) 50 MG tablet   Insomnia    Chronic and stable Continue current meds      Moderate protein-calorie malnutrition (HCC)    Weight is increasing Continue working on diet      Underweight    Weight is improving Continue to work on diet        Return in about 6 months (around 10/23/2022) for chronic disease f/u.      Amanda Tucker, Amanda Paganini, MD, have reviewed all documentation for this visit. The documentation  on 04/22/22 for the exam, diagnosis, procedures, and orders are all accurate and complete.   Makynna Manocchio, [name], MD, MPH [facility]

## 2022-04-22 NOTE — Assessment & Plan Note (Signed)
Well controlled Continue current medications Reviewed metabolic panel F/u in 6 months  

## 2022-04-22 NOTE — Assessment & Plan Note (Signed)
Well controlled on recent labs Continue statin Repeat FLP and CMP annually

## 2022-04-23 ENCOUNTER — Ambulatory Visit
Admission: RE | Admit: 2022-04-23 | Discharge: 2022-04-23 | Disposition: A | Discharge status: Home / Self Care | Payer: Medicare Other | Source: Ambulatory Visit | Attending: Family Medicine | Admitting: Family Medicine

## 2022-04-23 DIAGNOSIS — Z1231 Encounter for screening mammogram for malignant neoplasm of breast: Secondary | ICD-10-CM | POA: Insufficient documentation

## 2022-10-26 ENCOUNTER — Other Ambulatory Visit: Payer: Self-pay | Admitting: Family Medicine

## 2022-10-26 NOTE — Telephone Encounter (Signed)
Medication Refill - Medication: ALPRAZolam (XANAX) 0.5 MG tablet   Has the patient contacted their pharmacy? Yes.   (Agent: If no, request that the patient contact the pharmacy for the refill. If patient does not wish to contact the pharmacy document the reason why and proceed with request.) (Agent: If yes, when and what did the pharmacy advise?)  Preferred Pharmacy (with phone number or street name):  [id number] - [place], [place] - [address] AT [facility] [address]  [address] [place] [address]  Phone: [phone] Fax: [phone]   Has the patient been seen for an appointment in the last year OR does the patient have an upcoming appointment? Yes.    Agent: Please be advised that RX refills may take up to 3 business days. We ask that you follow-up with your pharmacy.

## 2022-10-26 NOTE — Telephone Encounter (Signed)
Requested medication (s) are due for refill today: Yes  Requested medication (s) are on the active medication list: Yes  Last refill:  04/22/22  Future visit scheduled: Yes  Notes to clinic:  Unable to refill per protocol, cannot delegate.      Requested Prescriptions  Pending Prescriptions Disp Refills   ALPRAZolam (XANAX) 0.5 MG tablet 30 tablet 5    Sig: TAKE 1/2 TABLET(0.25 MG) BY MOUTH TWICE DAILY AS NEEDED FOR ANXIETY     Not Delegated - Psychiatry: Anxiolytics/Hypnotics 2 Failed - 10/26/2022 10:00 AM      Failed - This refill cannot be delegated      Failed - Urine Drug Screen completed in last 360 days      Failed - Valid encounter within last 6 months    Recent Outpatient Visits           6 months ago Primary hypertension   [place] [facility] [place], Marzella Schlein, MD   9 months ago Primary hypertension   [place] [facility] [place], Marzella Schlein, MD   1 year ago Primary hypertension   [facility] [facility] [place], Marzella Schlein, MD   1 year ago Mixed hyperlipidemia   [place] [place], Marzella Schlein, MD   2 years ago Primary hypertension   [facility] [place], Marzella Schlein, MD       Future Appointments             In 3 weeks Bacigalupo, Marzella Schlein, MD [facility], [facility]            Passed - Patient is not pregnant

## 2022-10-28 MED ORDER — ALPRAZOLAM 0.5 MG PO TABS: ORAL_TABLET | ORAL | 5 refills | Status: DC

## 2022-10-31 DIAGNOSIS — Z23 Encounter for immunization: Secondary | ICD-10-CM | POA: Diagnosis not present

## 2022-11-16 ENCOUNTER — Encounter: Payer: Self-pay | Admitting: Family Medicine

## 2022-11-16 ENCOUNTER — Ambulatory Visit (INDEPENDENT_AMBULATORY_CARE_PROVIDER_SITE_OTHER): Payer: Medicare Other | Admitting: Family Medicine

## 2022-11-16 VITALS — BP 137/86 | HR 67 | Temp 98.3°F | Ht <= 58 in | Wt 80.0 lb

## 2022-11-16 DIAGNOSIS — R636 Underweight: Secondary | ICD-10-CM

## 2022-11-16 DIAGNOSIS — F411 Generalized anxiety disorder: Secondary | ICD-10-CM | POA: Diagnosis not present

## 2022-11-16 DIAGNOSIS — Z720 Tobacco use: Secondary | ICD-10-CM | POA: Diagnosis not present

## 2022-11-16 DIAGNOSIS — R2689 Other abnormalities of gait and mobility: Secondary | ICD-10-CM | POA: Diagnosis not present

## 2022-11-16 DIAGNOSIS — I10 Essential (primary) hypertension: Secondary | ICD-10-CM

## 2022-11-16 DIAGNOSIS — E782 Mixed hyperlipidemia: Secondary | ICD-10-CM | POA: Diagnosis not present

## 2022-11-16 DIAGNOSIS — E440 Moderate protein-calorie malnutrition: Secondary | ICD-10-CM | POA: Diagnosis not present

## 2022-11-16 NOTE — Progress Notes (Signed)
Established Patient Office Visit  Subjective   Patient ID: Amanda Tucker, female    DOB: 29-Jul-1945  Age: 77 y.o. MRN: [id number]  Chief Complaint  Patient presents with   balance issues     sometimes   Knee Pain    Right knee    HPI  Discussed the use of AI scribe software for clinical note transcription with the patient, who gave verbal consent to proceed.  History of Present Illness   The patient, with a history of high cholesterol, high blood pressure, and osteoporosis, presents for a routine check-up but also has a few health concerns. The patient has been experiencing balance issues, which have led to minor accidents such as hitting their spine on the storm door and slamming cabinet doors against themselves. The patient has tried physical therapy in the past, which they found helpful, and is considering starting it again. They also attend exercise classes at the senior center intermittently.  The patient also reports episodes of anxiety, which can escalate into panic attacks. These episodes are often triggered by situations where the patient feels they are losing their memory or confidence, such as forgetting a password or a term. The patient takes a low dose of Xanax to manage these episodes and to help with sleep when their mind is racing.  The patient also reports a persistently runny nose and is seeking an allergy medication that won't interfere with their blood pressure.         ROS    Objective:     BP 137/86 (BP Location: Left Arm, Patient Position: Sitting, Cuff Size: Normal)   Pulse 67   Temp 98.3 F (36.8 C) (Oral)   Ht 4\' 8"  (1.422 m)   Wt 80 lb (36.3 kg)   SpO2 100%   BMI 17.94 kg/m    Physical Exam Vitals reviewed.  Constitutional:      General: She is not in acute distress.    Appearance: Normal appearance. She is well-developed. She is not diaphoretic.  HENT:     Head: Normocephalic and atraumatic.  Eyes:     General: No scleral icterus.     Conjunctiva/sclera: Conjunctivae normal.  Neck:     Thyroid: No thyromegaly.  Cardiovascular:     Rate and Rhythm: Normal rate and regular rhythm.     Heart sounds: Normal heart sounds. No murmur heard. Pulmonary:     Effort: Pulmonary effort is normal. No respiratory distress.     Breath sounds: Normal breath sounds. No wheezing, rhonchi or rales.  Musculoskeletal:     Cervical back: Neck supple.     Right lower leg: No edema.     Left lower leg: No edema.  Lymphadenopathy:     Cervical: No cervical adenopathy.  Skin:    General: Skin is warm and dry.     Findings: No rash.  Neurological:     Mental Status: She is alert and oriented to person, place, and time. Mental status is at baseline.  Psychiatric:        Mood and Affect: Mood normal.        Behavior: Behavior normal.      No results found for any visits on 11/16/22.    The 10-year ASCVD risk score (Arnett DK, et al., 2019) is: 42.6%    Assessment & Plan:   Problem List Items Addressed This Visit       Cardiovascular and Mediastinum   Hypertension - Primary   Relevant Orders  Comprehensive metabolic panel   Lipid panel     Other   Hyperlipidemia   Relevant Orders   Comprehensive metabolic panel   Lipid panel   Generalized anxiety disorder   Tobacco abuse   Moderate protein-calorie malnutrition (HCC)   Underweight   Other Visit Diagnoses     Imbalance               Balance Issues Ongoing balance problems with occasional stumbles and falls. Previous physical therapy was beneficial. Patient considering restarting physical therapy. -Consider restarting physical therapy. Patient to discuss with family and inform office of decision.  Anxiety Occasional panic attacks, particularly when experiencing memory lapses. Currently managed on Zoloft 50mg  daily and occasional low-dose Xanax. -Continue current management.  Allergic Rhinitis Complaints of a persistently runny nose. Patient inquiring about  safe allergy medications. -Try over-the-counter Zyrtec or Claritin (not the 'D' versions).  Hyperlipidemia, Hypertension, and Osteoporosis Currently managed on Simvastatin 40mg  daily, Lisinopril 40mg  daily, and Fosamax 70mg  weekly. Blood pressure within normal limits today. -Continue current medications. -Order labs for cholesterol, kidney, and liver function.  General Health Maintenance -Flu and COVID-19 vaccinations up to date. -Schedule follow-up appointment in six months.        Return in about 6 months (around 05/17/2023) for chronic disease f/u.    Shirlee Latch, MD

## 2022-11-17 LAB — COMPREHENSIVE METABOLIC PANEL
ALT: 14 [IU]/L (ref 0–32)
AST: 20 [IU]/L (ref 0–40)
Albumin: 4.3 g/dL (ref 3.8–4.8)
Alkaline Phosphatase: 68 [IU]/L (ref 44–121)
BUN/Creatinine Ratio: 17 (ref 12–28)
BUN: 9 mg/dL (ref 8–27)
Bilirubin Total: 0.4 mg/dL (ref 0.0–1.2)
CO2: 25 mmol/L (ref 20–29)
Calcium: 9.1 mg/dL (ref 8.7–10.3)
Chloride: 100 mmol/L (ref 96–106)
Creatinine, Ser: 0.52 mg/dL — ABNORMAL LOW (ref 0.57–1.00)
Globulin, Total: 2.3 g/dL (ref 1.5–4.5)
Glucose: 78 mg/dL (ref 70–99)
Potassium: 4.7 mmol/L (ref 3.5–5.2)
Sodium: 140 mmol/L (ref 134–144)
Total Protein: 6.6 g/dL (ref 6.0–8.5)
eGFR: 95 mL/min/{1.73_m2} (ref >59)

## 2022-11-17 LAB — LIPID PANEL
Chol/HDL Ratio: 2.4 ratio (ref 0.0–4.4)
Cholesterol, Total: 167 mg/dL (ref 100–199)
HDL: 71 mg/dL (ref >39)
LDL Chol Calc (NIH): 78 mg/dL (ref 0–99)
Triglycerides: 98 mg/dL (ref 0–149)
VLDL Cholesterol Cal: 18 mg/dL (ref 5–40)

## 2022-11-19 ENCOUNTER — Telehealth: Payer: Self-pay

## 2022-11-19 DIAGNOSIS — R2689 Other abnormalities of gait and mobility: Secondary | ICD-10-CM

## 2022-11-19 NOTE — Telephone Encounter (Signed)
Copied from CRM [id number]. Topic: General - Other >> Nov 18, 2022  2:24 PM Phill Myron wrote: Amanda Tucker has decided to accept physical therapy per her discussion with Dr B ... She would like to go with [facility] [phone]

## 2022-11-22 NOTE — Telephone Encounter (Signed)
Ok to order referral to home health for PT - see diagnosis from face-to-face 10/22 OV.

## 2022-11-22 NOTE — Telephone Encounter (Signed)
Order placed

## 2022-11-29 ENCOUNTER — Telehealth: Payer: Self-pay | Admitting: Family Medicine

## 2022-11-29 MED ORDER — LISINOPRIL 40 MG PO TABS: ORAL_TABLET | ORAL | 3 refills | Status: DC

## 2022-11-29 NOTE — Telephone Encounter (Signed)
refilled 

## 2022-11-29 NOTE — Telephone Encounter (Signed)
[facility] faxed refill request for the following medications:   lisinopril (ZESTRIL) 40 MG tablet    Please advise  

## 2022-12-10 ENCOUNTER — Telehealth: Payer: Self-pay

## 2022-12-10 DIAGNOSIS — E440 Moderate protein-calorie malnutrition: Secondary | ICD-10-CM | POA: Diagnosis not present

## 2022-12-10 DIAGNOSIS — F411 Generalized anxiety disorder: Secondary | ICD-10-CM | POA: Diagnosis not present

## 2022-12-10 DIAGNOSIS — I10 Essential (primary) hypertension: Secondary | ICD-10-CM | POA: Diagnosis not present

## 2022-12-10 DIAGNOSIS — M810 Age-related osteoporosis without current pathological fracture: Secondary | ICD-10-CM | POA: Diagnosis not present

## 2022-12-10 DIAGNOSIS — E785 Hyperlipidemia, unspecified: Secondary | ICD-10-CM | POA: Diagnosis not present

## 2022-12-10 DIAGNOSIS — R278 Other lack of coordination: Secondary | ICD-10-CM | POA: Diagnosis not present

## 2022-12-10 NOTE — Telephone Encounter (Signed)
Copied from CRM [id number]. Topic: General - Other >> Dec 10, 2022 11:06 AM Epimenio Foot F wrote: Reason for CRM: Home Health Verbal Orders - Caller/Agency: [facility]  Callback Number: [phone]  Service Requested: Physical Therapy Frequency: 1 week 4 and then every other week for 4 weeks  Any new concerns about the patient? No, Vernona Rieger wanted to add they will also do one telehealth visit with the pt along with the other visits.

## 2022-12-13 NOTE — Telephone Encounter (Signed)
OK for verbals

## 2022-12-13 NOTE — Telephone Encounter (Signed)
Detailed voicemail left on secure line given verbals. Ok for [facility] to advise if agency returns call

## 2022-12-16 DIAGNOSIS — F411 Generalized anxiety disorder: Secondary | ICD-10-CM | POA: Diagnosis not present

## 2022-12-16 DIAGNOSIS — E785 Hyperlipidemia, unspecified: Secondary | ICD-10-CM | POA: Diagnosis not present

## 2022-12-16 DIAGNOSIS — I10 Essential (primary) hypertension: Secondary | ICD-10-CM | POA: Diagnosis not present

## 2022-12-16 DIAGNOSIS — E440 Moderate protein-calorie malnutrition: Secondary | ICD-10-CM | POA: Diagnosis not present

## 2022-12-16 DIAGNOSIS — R278 Other lack of coordination: Secondary | ICD-10-CM | POA: Diagnosis not present

## 2022-12-16 DIAGNOSIS — M810 Age-related osteoporosis without current pathological fracture: Secondary | ICD-10-CM | POA: Diagnosis not present

## 2022-12-25 DIAGNOSIS — M810 Age-related osteoporosis without current pathological fracture: Secondary | ICD-10-CM | POA: Diagnosis not present

## 2022-12-25 DIAGNOSIS — R278 Other lack of coordination: Secondary | ICD-10-CM | POA: Diagnosis not present

## 2022-12-25 DIAGNOSIS — E440 Moderate protein-calorie malnutrition: Secondary | ICD-10-CM | POA: Diagnosis not present

## 2022-12-25 DIAGNOSIS — E785 Hyperlipidemia, unspecified: Secondary | ICD-10-CM | POA: Diagnosis not present

## 2022-12-25 DIAGNOSIS — I10 Essential (primary) hypertension: Secondary | ICD-10-CM | POA: Diagnosis not present

## 2022-12-25 DIAGNOSIS — F411 Generalized anxiety disorder: Secondary | ICD-10-CM | POA: Diagnosis not present

## 2022-12-30 DIAGNOSIS — I10 Essential (primary) hypertension: Secondary | ICD-10-CM | POA: Diagnosis not present

## 2022-12-30 DIAGNOSIS — R278 Other lack of coordination: Secondary | ICD-10-CM | POA: Diagnosis not present

## 2022-12-30 DIAGNOSIS — M810 Age-related osteoporosis without current pathological fracture: Secondary | ICD-10-CM | POA: Diagnosis not present

## 2022-12-30 DIAGNOSIS — E785 Hyperlipidemia, unspecified: Secondary | ICD-10-CM | POA: Diagnosis not present

## 2022-12-30 DIAGNOSIS — F411 Generalized anxiety disorder: Secondary | ICD-10-CM | POA: Diagnosis not present

## 2022-12-30 DIAGNOSIS — E440 Moderate protein-calorie malnutrition: Secondary | ICD-10-CM | POA: Diagnosis not present

## 2023-01-03 ENCOUNTER — Telehealth: Payer: Self-pay

## 2023-01-03 NOTE — Telephone Encounter (Signed)
Copied from CRM [id number]. Topic: General - Other >> Dec 31, 2022  1:56 PM Santiya F wrote: Reason for CRM: Amanda Tucker with [facility] is calling in because they received a referral for services for balance issues but it doesn't specfify why she is having the balance issues, per Amanda Tucker. Amanda Tucker says she sees right knee pain, but it doesn't say if the pain is caused by Arthritis and if that's what's causing the balance issues. Amanda Tucker says she needs to know for coding. She can be reached at [phone]

## 2023-01-03 NOTE — Telephone Encounter (Signed)
Ok to use history of falls, osteoporosis and malnutrition for her codes.

## 2023-01-03 NOTE — Telephone Encounter (Signed)
Amanda Tucker with [facility] stating for insurance purposes they need a more specific dx code. The primary dx is balance issues but they need it to be more specific. Needing to know why pt is having balance issues, is it because of her osteoporosis or pt's hypertension. Amanda Tucker states they typically only get 3 days to correct dx and it has now been 6 days.   Lis requesting callback, [phone]

## 2023-01-04 NOTE — Telephone Encounter (Signed)
Codes Z91.81-History of falls M81.0- Osteoporosis R63.6 -Malnutrition given to Amanda Tucker with [facility]

## 2023-01-06 DIAGNOSIS — E785 Hyperlipidemia, unspecified: Secondary | ICD-10-CM | POA: Diagnosis not present

## 2023-01-06 DIAGNOSIS — F411 Generalized anxiety disorder: Secondary | ICD-10-CM | POA: Diagnosis not present

## 2023-01-06 DIAGNOSIS — R278 Other lack of coordination: Secondary | ICD-10-CM | POA: Diagnosis not present

## 2023-01-06 DIAGNOSIS — I10 Essential (primary) hypertension: Secondary | ICD-10-CM | POA: Diagnosis not present

## 2023-01-06 DIAGNOSIS — M810 Age-related osteoporosis without current pathological fracture: Secondary | ICD-10-CM | POA: Diagnosis not present

## 2023-01-06 DIAGNOSIS — E440 Moderate protein-calorie malnutrition: Secondary | ICD-10-CM | POA: Diagnosis not present

## 2023-01-07 ENCOUNTER — Telehealth: Payer: Self-pay | Admitting: Family Medicine

## 2023-01-07 NOTE — Telephone Encounter (Signed)
[employer] certification paperwork received via fax 01/07/23 front office portion completed and placed in provider mailbox

## 2023-01-09 DIAGNOSIS — I10 Essential (primary) hypertension: Secondary | ICD-10-CM | POA: Diagnosis not present

## 2023-01-09 DIAGNOSIS — E440 Moderate protein-calorie malnutrition: Secondary | ICD-10-CM | POA: Diagnosis not present

## 2023-01-09 DIAGNOSIS — M810 Age-related osteoporosis without current pathological fracture: Secondary | ICD-10-CM | POA: Diagnosis not present

## 2023-01-09 DIAGNOSIS — E785 Hyperlipidemia, unspecified: Secondary | ICD-10-CM | POA: Diagnosis not present

## 2023-01-09 DIAGNOSIS — R278 Other lack of coordination: Secondary | ICD-10-CM | POA: Diagnosis not present

## 2023-01-09 DIAGNOSIS — F411 Generalized anxiety disorder: Secondary | ICD-10-CM | POA: Diagnosis not present

## 2023-01-10 DIAGNOSIS — E440 Moderate protein-calorie malnutrition: Secondary | ICD-10-CM

## 2023-01-10 DIAGNOSIS — E785 Hyperlipidemia, unspecified: Secondary | ICD-10-CM

## 2023-01-10 DIAGNOSIS — F411 Generalized anxiety disorder: Secondary | ICD-10-CM

## 2023-01-10 DIAGNOSIS — M810 Age-related osteoporosis without current pathological fracture: Secondary | ICD-10-CM

## 2023-01-10 DIAGNOSIS — R278 Other lack of coordination: Secondary | ICD-10-CM

## 2023-01-10 DIAGNOSIS — I10 Essential (primary) hypertension: Secondary | ICD-10-CM

## 2023-01-10 NOTE — Telephone Encounter (Signed)
Faxed and duplicate that was printed 01/10/2023 came up that was signed as well.

## 2023-01-14 DIAGNOSIS — E440 Moderate protein-calorie malnutrition: Secondary | ICD-10-CM | POA: Diagnosis not present

## 2023-01-14 DIAGNOSIS — I10 Essential (primary) hypertension: Secondary | ICD-10-CM | POA: Diagnosis not present

## 2023-01-14 DIAGNOSIS — F411 Generalized anxiety disorder: Secondary | ICD-10-CM | POA: Diagnosis not present

## 2023-01-14 DIAGNOSIS — M810 Age-related osteoporosis without current pathological fracture: Secondary | ICD-10-CM | POA: Diagnosis not present

## 2023-01-14 DIAGNOSIS — R278 Other lack of coordination: Secondary | ICD-10-CM | POA: Diagnosis not present

## 2023-01-14 DIAGNOSIS — E785 Hyperlipidemia, unspecified: Secondary | ICD-10-CM | POA: Diagnosis not present

## 2023-01-22 DIAGNOSIS — F411 Generalized anxiety disorder: Secondary | ICD-10-CM | POA: Diagnosis not present

## 2023-01-22 DIAGNOSIS — R278 Other lack of coordination: Secondary | ICD-10-CM | POA: Diagnosis not present

## 2023-01-22 DIAGNOSIS — I10 Essential (primary) hypertension: Secondary | ICD-10-CM | POA: Diagnosis not present

## 2023-01-22 DIAGNOSIS — M810 Age-related osteoporosis without current pathological fracture: Secondary | ICD-10-CM | POA: Diagnosis not present

## 2023-01-22 DIAGNOSIS — E440 Moderate protein-calorie malnutrition: Secondary | ICD-10-CM | POA: Diagnosis not present

## 2023-01-22 DIAGNOSIS — E785 Hyperlipidemia, unspecified: Secondary | ICD-10-CM | POA: Diagnosis not present

## 2023-01-28 ENCOUNTER — Other Ambulatory Visit: Payer: Self-pay | Admitting: Family Medicine

## 2023-02-01 NOTE — Telephone Encounter (Signed)
 Requested medications are due for refill today.  no  Requested medications are on the active medications list.  yes  Last refill. 10/28/2022 #30 5 rf  Future visit scheduled.   yes  Notes to clinic.  Refill/refusal not delegated.    Requested Prescriptions  Pending Prescriptions Disp Refills   ALPRAZolam  (XANAX ) 0.5 MG tablet [Pharmacy Med Name: ALPRAZOLAM  0.5MG  TABLETS] 30 tablet     Sig: TAKE 1/2 TABLET(0.25 MG) BY MOUTH TWICE DAILY AS NEEDED FOR ANXIETY     Not Delegated - Psychiatry: Anxiolytics/Hypnotics 2 Failed - 02/01/2023  8:19 AM      Failed - This refill cannot be delegated      Failed - Urine Drug Screen completed in last 360 days      Passed - Patient is not pregnant      Passed - Valid encounter within last 6 months    Recent Outpatient Visits           2 months ago Primary hypertension   [place] [facility] [place], Jon HERO, MD   9 months ago Primary hypertension   [place] [facility] [place], Jon HERO, MD   1 year ago Primary hypertension   [place] [facility] [place], Jon HERO, MD   1 year ago Primary hypertension   [place] [facility] [place], Jon HERO, MD   1 year ago Mixed hyperlipidemia   [place] [facility] [place], Jon HERO, MD       Future Appointments             In 3 months Bacigalupo, Jon HERO, MD [facility]            Refused Prescriptions Disp Refills   simvastatin  (ZOCOR ) 40 MG tablet [Pharmacy Med Name: SIMVASTATIN  40MG  TABLETS] 90 tablet 3    Sig: TAKE 1 TABLET(40 MG) BY MOUTH DAILY     Cardiovascular:  Antilipid - Statins Failed - 02/01/2023  8:19 AM      Failed - Lipid Panel in normal range within the last 12 months    Cholesterol, Total  Date Value Ref Range Status  11/16/2022 167 100 - 199 mg/dL Final   LDL Cholesterol (Calc)  Date Value Ref Range Status  12/14/2016 97 mg/dL  (calc) Final    Comment:    Reference range: <100 . Desirable range <100 mg/dL for primary prevention;   <70 mg/dL for patients with CHD or diabetic patients  with > or = 2 CHD risk factors. SABRA LDL-C is now calculated using the Martin-Hopkins  calculation, which is a validated novel method providing  better accuracy than the Friedewald equation in the  estimation of LDL-C.  Gladis APPLETHWAITE et al. SANDREA. 7986;689(80): 2061-2068  (http://education.[employer].com/faq/FAQ164)    LDL Chol Calc (NIH)  Date Value Ref Range Status  11/16/2022 78 0 - 99 mg/dL Final   HDL  Date Value Ref Range Status  11/16/2022 71 >39 mg/dL Final   Triglycerides  Date Value Ref Range Status  11/16/2022 98 0 - 149 mg/dL Final         Passed - Patient is not pregnant      Passed - Valid encounter within last 12 months    Recent Outpatient Visits           2 months ago Primary hypertension   [place] [facility] [place], Jon HERO, MD   9 months ago Primary hypertension   [place]  [facility] Bacigal[place], Jon HERO, MD   1 year ago Primary hypertension   [place] [facility] [place], Jon HERO, MD   1 year ago Primary hypertension   [place] [facility] [place], Jon HERO, MD   1 year ago Mixed hyperlipidemia   [place] [facility] [place], Jon HERO, MD       Future Appointments             In 3 months Bacigalupo, Jon HERO, MD [facility]

## 2023-02-01 NOTE — Telephone Encounter (Signed)
 Requested Prescriptions  Pending Prescriptions Disp Refills   ALPRAZolam  (XANAX ) 0.5 MG tablet [Pharmacy Med Name: ALPRAZOLAM  0.5MG  TABLETS] 30 tablet     Sig: TAKE 1/2 TABLET(0.25 MG) BY MOUTH TWICE DAILY AS NEEDED FOR ANXIETY     Not Delegated - Psychiatry: Anxiolytics/Hypnotics 2 Failed - 02/01/2023  8:18 AM      Failed - This refill cannot be delegated      Failed - Urine Drug Screen completed in last 360 days      Passed - Patient is not pregnant      Passed - Valid encounter within last 6 months    Recent Outpatient Visits           2 months ago Primary hypertension   [facility] [place], Jon HERO, MD   9 months ago Primary hypertension   [place] [facility] [place], Jon HERO, MD   1 year ago Primary hypertension   [place] [facility] [place], Jon HERO, MD   1 year ago Primary hypertension   [facility] [place], Jon HERO, MD   1 year ago Mixed hyperlipidemia   [facility] [place], Jon HERO, MD       Future Appointments             In 3 months Bacigalupo, Jon HERO, MD [facility]             simvastatin  (ZOCOR ) 40 MG tablet [Pharmacy Med Name: SIMVASTATIN  40MG  TABLETS] 90 tablet 3    Sig: TAKE 1 TABLET(40 MG) BY MOUTH DAILY     Cardiovascular:  Antilipid - Statins Failed - 02/01/2023  8:18 AM      Failed - Lipid Panel in normal range within the last 12 months    Cholesterol, Total  Date Value Ref Range Status  11/16/2022 167 100 - 199 mg/dL Final   LDL Cholesterol (Calc)  Date Value Ref Range Status  12/14/2016 97 mg/dL (calc) Final    Comment:    Reference range: <100 . Desirable range <100 mg/dL for primary prevention;   <70 mg/dL for patients with CHD or diabetic patients  with > or = 2 CHD risk factors. SABRA LDL-C is now calculated using the Martin-Hopkins  calculation, which is a validated  novel method providing  better accuracy than the Friedewald equation in the  estimation of LDL-C.  Gladis APPLETHWAITE et al. SANDREA. 7986;689(80): 2061-2068  (http://education.[employer].com/faq/FAQ164)    LDL Chol Calc (NIH)  Date Value Ref Range Status  11/16/2022 78 0 - 99 mg/dL Final   HDL  Date Value Ref Range Status  11/16/2022 71 >39 mg/dL Final   Triglycerides  Date Value Ref Range Status  11/16/2022 98 0 - 149 mg/dL Final         Passed - Patient is not pregnant      Passed - Valid encounter within last 12 months    Recent Outpatient Visits           2 months ago Primary hypertension   [place] [place], Jon HERO, MD   9 months ago Primary hypertension   [place] [facility] [place], Jon HERO, MD   1 year ago Primary hypertension    [facility], Jon HERO, MD   1 year ago Primary hypertension    [facility] [place], Jon HERO, MD   1 year ago Mixed hyperlipidemia  [facility] [place], Jon HERO, MD       Future Appointments             In 3 months Bacigalupo, Jon HERO, MD [facility]

## 2023-02-04 DIAGNOSIS — R278 Other lack of coordination: Secondary | ICD-10-CM | POA: Diagnosis not present

## 2023-02-04 DIAGNOSIS — E785 Hyperlipidemia, unspecified: Secondary | ICD-10-CM | POA: Diagnosis not present

## 2023-02-04 DIAGNOSIS — M810 Age-related osteoporosis without current pathological fracture: Secondary | ICD-10-CM | POA: Diagnosis not present

## 2023-02-04 DIAGNOSIS — I10 Essential (primary) hypertension: Secondary | ICD-10-CM | POA: Diagnosis not present

## 2023-02-04 DIAGNOSIS — E440 Moderate protein-calorie malnutrition: Secondary | ICD-10-CM | POA: Diagnosis not present

## 2023-02-04 DIAGNOSIS — F411 Generalized anxiety disorder: Secondary | ICD-10-CM | POA: Diagnosis not present

## 2023-02-07 ENCOUNTER — Telehealth: Payer: Self-pay

## 2023-02-07 NOTE — Telephone Encounter (Signed)
 Copied from CRM [id number]. Topic: Quick Communication - Home Health Verbal Orders >> Feb 04, 2023  3:54 PM Everette C wrote: Caller/Agency: Leita IVER Donn Mitch Number: [phone] Service Requested: Physical Therapy Frequency: 1w4, 1 every other w 4  Any new concerns about the patient? No  Kyleigha would also like to discuss a prescription for the patient to have a rollator properly sized for a patient that is 4'11 and 8` lbs   Please fax rx order to [phone] if provided   Please contact further if needed

## 2023-02-07 NOTE — Telephone Encounter (Signed)
 OK for verbals. Ok to get paper Rx for rollator and we will just note needing to size it for her with her weight and height on the Rx and I will sign

## 2023-02-08 DIAGNOSIS — R278 Other lack of coordination: Secondary | ICD-10-CM | POA: Diagnosis not present

## 2023-02-08 DIAGNOSIS — F411 Generalized anxiety disorder: Secondary | ICD-10-CM | POA: Diagnosis not present

## 2023-02-08 DIAGNOSIS — E785 Hyperlipidemia, unspecified: Secondary | ICD-10-CM | POA: Diagnosis not present

## 2023-02-08 DIAGNOSIS — M810 Age-related osteoporosis without current pathological fracture: Secondary | ICD-10-CM | POA: Diagnosis not present

## 2023-02-08 DIAGNOSIS — I10 Essential (primary) hypertension: Secondary | ICD-10-CM | POA: Diagnosis not present

## 2023-02-08 DIAGNOSIS — E440 Moderate protein-calorie malnutrition: Secondary | ICD-10-CM | POA: Diagnosis not present

## 2023-02-08 NOTE — Telephone Encounter (Signed)
 Verbals given on secure line. OK for [facility] to advise if call is returned Written rx placed in provider box to be signed

## 2023-02-10 DIAGNOSIS — R278 Other lack of coordination: Secondary | ICD-10-CM | POA: Diagnosis not present

## 2023-02-10 DIAGNOSIS — M810 Age-related osteoporosis without current pathological fracture: Secondary | ICD-10-CM | POA: Diagnosis not present

## 2023-02-10 DIAGNOSIS — F411 Generalized anxiety disorder: Secondary | ICD-10-CM | POA: Diagnosis not present

## 2023-02-10 DIAGNOSIS — I10 Essential (primary) hypertension: Secondary | ICD-10-CM | POA: Diagnosis not present

## 2023-02-10 DIAGNOSIS — E785 Hyperlipidemia, unspecified: Secondary | ICD-10-CM | POA: Diagnosis not present

## 2023-02-10 DIAGNOSIS — E440 Moderate protein-calorie malnutrition: Secondary | ICD-10-CM | POA: Diagnosis not present

## 2023-02-16 DIAGNOSIS — I10 Essential (primary) hypertension: Secondary | ICD-10-CM | POA: Diagnosis not present

## 2023-02-16 DIAGNOSIS — R278 Other lack of coordination: Secondary | ICD-10-CM | POA: Diagnosis not present

## 2023-02-16 DIAGNOSIS — E785 Hyperlipidemia, unspecified: Secondary | ICD-10-CM | POA: Diagnosis not present

## 2023-02-16 DIAGNOSIS — E440 Moderate protein-calorie malnutrition: Secondary | ICD-10-CM | POA: Diagnosis not present

## 2023-02-16 DIAGNOSIS — M810 Age-related osteoporosis without current pathological fracture: Secondary | ICD-10-CM | POA: Diagnosis not present

## 2023-02-16 DIAGNOSIS — F411 Generalized anxiety disorder: Secondary | ICD-10-CM | POA: Diagnosis not present

## 2023-02-17 DIAGNOSIS — E440 Moderate protein-calorie malnutrition: Secondary | ICD-10-CM | POA: Diagnosis not present

## 2023-02-17 DIAGNOSIS — E785 Hyperlipidemia, unspecified: Secondary | ICD-10-CM | POA: Diagnosis not present

## 2023-02-17 DIAGNOSIS — F411 Generalized anxiety disorder: Secondary | ICD-10-CM | POA: Diagnosis not present

## 2023-02-17 DIAGNOSIS — I10 Essential (primary) hypertension: Secondary | ICD-10-CM | POA: Diagnosis not present

## 2023-02-17 DIAGNOSIS — R278 Other lack of coordination: Secondary | ICD-10-CM | POA: Diagnosis not present

## 2023-02-17 DIAGNOSIS — M810 Age-related osteoporosis without current pathological fracture: Secondary | ICD-10-CM | POA: Diagnosis not present

## 2023-02-24 DIAGNOSIS — E785 Hyperlipidemia, unspecified: Secondary | ICD-10-CM | POA: Diagnosis not present

## 2023-02-24 DIAGNOSIS — M810 Age-related osteoporosis without current pathological fracture: Secondary | ICD-10-CM | POA: Diagnosis not present

## 2023-02-24 DIAGNOSIS — I10 Essential (primary) hypertension: Secondary | ICD-10-CM | POA: Diagnosis not present

## 2023-02-24 DIAGNOSIS — F411 Generalized anxiety disorder: Secondary | ICD-10-CM | POA: Diagnosis not present

## 2023-02-24 DIAGNOSIS — E440 Moderate protein-calorie malnutrition: Secondary | ICD-10-CM | POA: Diagnosis not present

## 2023-02-24 DIAGNOSIS — R278 Other lack of coordination: Secondary | ICD-10-CM | POA: Diagnosis not present

## 2023-03-10 DIAGNOSIS — I10 Essential (primary) hypertension: Secondary | ICD-10-CM | POA: Diagnosis not present

## 2023-03-10 DIAGNOSIS — M810 Age-related osteoporosis without current pathological fracture: Secondary | ICD-10-CM | POA: Diagnosis not present

## 2023-03-10 DIAGNOSIS — R278 Other lack of coordination: Secondary | ICD-10-CM | POA: Diagnosis not present

## 2023-03-10 DIAGNOSIS — E785 Hyperlipidemia, unspecified: Secondary | ICD-10-CM | POA: Diagnosis not present

## 2023-03-10 DIAGNOSIS — F411 Generalized anxiety disorder: Secondary | ICD-10-CM | POA: Diagnosis not present

## 2023-03-10 DIAGNOSIS — E440 Moderate protein-calorie malnutrition: Secondary | ICD-10-CM | POA: Diagnosis not present

## 2023-03-14 ENCOUNTER — Telehealth: Payer: Self-pay

## 2023-03-14 DIAGNOSIS — E785 Hyperlipidemia, unspecified: Secondary | ICD-10-CM | POA: Diagnosis not present

## 2023-03-14 DIAGNOSIS — E440 Moderate protein-calorie malnutrition: Secondary | ICD-10-CM | POA: Diagnosis not present

## 2023-03-14 DIAGNOSIS — M810 Age-related osteoporosis without current pathological fracture: Secondary | ICD-10-CM | POA: Diagnosis not present

## 2023-03-14 DIAGNOSIS — F411 Generalized anxiety disorder: Secondary | ICD-10-CM | POA: Diagnosis not present

## 2023-03-14 DIAGNOSIS — I10 Essential (primary) hypertension: Secondary | ICD-10-CM | POA: Diagnosis not present

## 2023-03-14 DIAGNOSIS — R278 Other lack of coordination: Secondary | ICD-10-CM | POA: Diagnosis not present

## 2023-03-14 NOTE — Telephone Encounter (Signed)
OK for verbals. Needs face to face for DME

## 2023-03-14 NOTE — Telephone Encounter (Signed)
Copied from CRM [id number]. Topic: Quick Communication - Home Health Verbal Orders >> Mar 14, 2023  2:29 PM Everette C wrote: Caller/Agency: [facility] Pam Number: [phone] Service Requested: Physical Therapy Frequency: 1w4 Any new concerns about the patient? Yes  Vernona Rieger would also like to discuss a prescription for a pediatric rollator   Fax number [phone] / CC

## 2023-03-16 NOTE — Telephone Encounter (Signed)
Verbals provided and advised of face to face needed for DME Reports she will contact patient and advise so that appointment can be scheduled

## 2023-03-22 ENCOUNTER — Ambulatory Visit (INDEPENDENT_AMBULATORY_CARE_PROVIDER_SITE_OTHER): Payer: Medicare Other | Admitting: Family Medicine

## 2023-03-22 ENCOUNTER — Encounter: Payer: Self-pay | Admitting: Family Medicine

## 2023-03-22 VITALS — BP 154/94 | HR 80 | Wt 79.1 lb

## 2023-03-22 DIAGNOSIS — Z5986 Financial insecurity: Secondary | ICD-10-CM | POA: Diagnosis not present

## 2023-03-22 DIAGNOSIS — Z9181 History of falling: Secondary | ICD-10-CM

## 2023-03-22 DIAGNOSIS — I10 Essential (primary) hypertension: Secondary | ICD-10-CM

## 2023-03-22 DIAGNOSIS — R2689 Other abnormalities of gait and mobility: Secondary | ICD-10-CM | POA: Diagnosis not present

## 2023-03-22 DIAGNOSIS — E440 Moderate protein-calorie malnutrition: Secondary | ICD-10-CM | POA: Diagnosis not present

## 2023-03-22 DIAGNOSIS — R636 Underweight: Secondary | ICD-10-CM

## 2023-03-22 NOTE — Progress Notes (Signed)
 Established patient visit   Patient: Amanda Tucker   DOB: 1946-01-13   78 y.o. Female  MRN: [id number] Visit Date: 03/22/2023  Today's healthcare provider: Shirlee Latch, MD   Chief Complaint  Patient presents with   DME Request    Patient is currently in physical therapy and needs an order for pediatric rolliator    Subjective    HPI HPI     DME Request    Additional comments: Patient is currently in physical therapy and needs an order for pediatric rolliator       Last edited by Acey Lav, CMA on 03/22/2023  1:07 PM.       Discussed the use of AI scribe software for clinical note transcription with the patient, who gave verbal consent to proceed.  History of Present Illness   The patient is an elderly woman with a history of osteoporosis, balance issues, and fall risk. She has been recommended a pediatric rollator by her home health physical therapist to assist with her mobility and prevent falls. The patient's daughter, who is present during the consultation, is concerned about her mother's financial situation and is considering applying for Medicaid. The patient also reports experiencing anxiety and panic attacks, which she attributes to the stress of her health and financial situation.         Medications: Outpatient Medications Prior to Visit  Medication Sig   alendronate (FOSAMAX) 70 MG tablet Take 1 tablet (70 mg total) by mouth once a week. Take with a full glass of water on an empty stomach.   ALPRAZolam (XANAX) 0.5 MG tablet TAKE 1/2 TABLET(0.25 MG) BY MOUTH TWICE DAILY AS NEEDED FOR ANXIETY   aspirin EC 81 MG tablet Take 81 mg daily by mouth.   lisinopril (ZESTRIL) 40 MG tablet TAKE 1 TABLET(40 MG) BY MOUTH DAILY   Multiple Vitamin (MULTIVITAMIN ADULT PO) Take by mouth daily.   omeprazole (PRILOSEC) 20 MG capsule Take 20 mg by mouth daily. Unsure of dose   sertraline (ZOLOFT) 50 MG tablet Take 1 tablet (50 mg total) by mouth daily.    simvastatin (ZOCOR) 40 MG tablet Take 1 tablet (40 mg total) by mouth daily at 6 PM.   VITAMIN D PO Take by mouth daily.   [DISCONTINUED] ibuprofen (ADVIL) 200 MG tablet Take 200 mg by mouth every 6 (six) hours as needed. (Patient not taking: Reported on 03/22/2023)   No facility-administered medications prior to visit.    Review of Systems     Objective    BP (!) 154/94 (BP Location: Right Arm, Patient Position: Sitting, Cuff Size: Small)   Pulse 80   Wt 79 lb 1.6 oz (35.9 kg)   SpO2 99%   BMI 17.73 kg/m    Physical Exam Vitals reviewed.  Constitutional:      General: She is not in acute distress.    Appearance: She is well-developed.  HENT:     Head: Normocephalic and atraumatic.  Eyes:     General: No scleral icterus.    Conjunctiva/sclera: Conjunctivae normal.  Cardiovascular:     Rate and Rhythm: Normal rate and regular rhythm.     Heart sounds: Normal heart sounds.  Pulmonary:     Effort: Pulmonary effort is normal. No respiratory distress.     Breath sounds: Normal breath sounds.  Skin:    General: Skin is warm and dry.     Findings: No rash.  Neurological:     Mental Status: She is alert  and oriented to person, place, and time.     Gait: Gait abnormal.  Psychiatric:        Behavior: Behavior normal.      No results found for any visits on 03/22/23.  Assessment & Plan     Problem List Items Addressed This Visit       Cardiovascular and Mediastinum   Hypertension   Relevant Orders   AMB Referral VBCI Care Management     Other   Moderate protein-calorie malnutrition (HCC)   Relevant Orders   For home use only DME Walker rolling   AMB Referral VBCI Care Management   Underweight   Relevant Orders   For home use only DME Walker rolling   AMB Referral VBCI Care Management   Other Visit Diagnoses       Imbalance    -  Primary   Relevant Orders   For home use only DME Walker rolling     At high risk for injury related to fall       Relevant  Orders   For home use only DME Walker rolling     Financial insecurity       Relevant Orders   AMB Referral VBCI Care Management       Assessment and Plan    Balance Issues and Fall Risk Presents with balance issues and fall risk, exacerbated by deconditioning. Osteoporosis increases fracture risk if she falls. Discussed need for a pediatric rollator to aid in mobility and prevent falls. Medicare requires face-to-face visit to document need for rollator. Coordinating with home health PT and DME company for equipment delivery. - Order pediatric rollator - Fax order to DME company - Coordinate with home health PT  Osteoporosis Known diagnosis of osteoporosis, which increases fracture risk if she falls. Emphasized importance of preventing falls. - Ensure use of rollator to prevent falls  Arthritis Reports knee discomfort and crepitus, suggestive of arthritis, particularly in the right knee. Symptoms consistent with age-related degenerative changes. - Monitor symptoms - Consider further evaluation if symptoms worsen  Hypertension Blood pressure elevated at 154/94 mmHg during visit. Considered white coat hypertension; noted home readings are typically lower. Discussed obtaining a properly fitting blood pressure cuff for home use. - Monitor blood pressure at home - Consider obtaining a properly fitting blood pressure cuff for home use  Anxiety and Panic Attacks Reports anxiety and panic attacks, likely exacerbated by current stressors. Discussed potential benefit of counseling and therapy. Social workers can provide therapy and counseling support. - Refer to Child psychotherapist for counseling and therapy - Consider additional support from nurse case manager  General Health Maintenance Discussed importance of maintaining general health, including managing financial and social support needs. Discussed Medicaid application and potential dual eligibility with Medicare. Social workers can assist  with financial concerns, food insecurity, and other community resources. - Refer to Child psychotherapist for assistance with Medicaid application and other community resources - Address food insecurity and other social determinants of health  Follow-up - Schedule follow-up visit in two months - Annual wellness visit scheduled for March - Ensure patient answers calls from social workers and Transport planner.        No follow-ups on file.       Shirlee Latch, MD  [facility] [phone] (phone) [phone] (fax)  [facility]

## 2023-03-23 ENCOUNTER — Telehealth: Payer: Self-pay | Admitting: *Deleted

## 2023-03-23 NOTE — Progress Notes (Signed)
 Complex Care Management Note  Care Guide Note 03/23/2023 Name: Pricilla Moehle MRN: [id number] DOB: 20-Aug-1945  Marycarmen Hagey is a 78 y.o. year old female who sees Bacigalupo, Marzella Schlein, MD for primary care. I reached out to [name] by phone today to offer complex care management services.  Ms. Windholz was given information about Complex Care Management services today including:   The Complex Care Management services include support from the care team which includes your Nurse Care Manager, Clinical Social Worker, or Pharmacist.  The Complex Care Management team is here to help remove barriers to the health concerns and goals most important to you. Complex Care Management services are voluntary, and the patient may decline or stop services at any time by request to their care team member.   Complex Care Management Consent Status: Patient agreed to services and verbal consent obtained.   Follow up plan:  Telephone appointment with complex care management team member scheduled for:  04/04/2023 and 04/08/2023  Encounter Outcome:  Patient Scheduled  Burman Nieves, CMA, Care Guide [facility]  [facility], [facility] Guide Direct Dial: [phone]  Fax: [phone] Website: [email or web address]

## 2023-04-01 ENCOUNTER — Telehealth: Payer: Self-pay | Admitting: Family Medicine

## 2023-04-01 DIAGNOSIS — M810 Age-related osteoporosis without current pathological fracture: Secondary | ICD-10-CM | POA: Diagnosis not present

## 2023-04-01 DIAGNOSIS — E785 Hyperlipidemia, unspecified: Secondary | ICD-10-CM | POA: Diagnosis not present

## 2023-04-01 DIAGNOSIS — E440 Moderate protein-calorie malnutrition: Secondary | ICD-10-CM | POA: Diagnosis not present

## 2023-04-01 DIAGNOSIS — R278 Other lack of coordination: Secondary | ICD-10-CM | POA: Diagnosis not present

## 2023-04-01 DIAGNOSIS — F411 Generalized anxiety disorder: Secondary | ICD-10-CM | POA: Diagnosis not present

## 2023-04-01 DIAGNOSIS — I10 Essential (primary) hypertension: Secondary | ICD-10-CM | POA: Diagnosis not present

## 2023-04-01 NOTE — Telephone Encounter (Signed)
 Copied from CRM [id number]. Topic: Clinical - Prescription Issue >> Apr 01, 2023  9:06 AM Marland Kitchen D wrote: A home health agent Whitney Post- called into to request a walker for the patient. Due to her not providing the correct address or know what's on file I contacted the office to see if anyone had spoke to her. She stated someone from the office called her about the walker.  I let the agent know someone from the office would be reaching out to her regarding this. Call back number- [phone]

## 2023-04-01 NOTE — Telephone Encounter (Signed)
 Returned call and advised I would reach out to DME team to check on updates as patient has not received her equipment that was ordered at her LOV. Lora and patient verbalized understanding. Appreciated the call back with update.

## 2023-04-04 ENCOUNTER — Ambulatory Visit: Payer: Self-pay | Admitting: *Deleted

## 2023-04-04 NOTE — Patient Outreach (Addendum)
 Care Coordination   Initial Visit Note   04/04/2023 Name: Amanda Tucker MRN: [id number] DOB: August 01, 1945  Amanda Tucker is a 78 y.o. year old female who sees Bacigalupo, Marzella Schlein, MD for primary care. I spoke with  Amanda Tucker daughter's by phone today.  What matters to the patients health and wellness today?  Patient's daughter reports that patient ability to complete her ADL's and IADL's is beginning to decrease. Patient's daughter requesting community resource information to assist with Meals, finances and mental health support    Goals Addressed             This Visit's Progress    Community resource needs to address meals, finances and mental health support       Activities and task to complete in order to accomplish goals.    Complete the referral for the Low Energy Assistance Program, food stamps and Medicaid by visiting ePASS portal at [email or web address]://hunt-bailey.[email or web address]/ Keep all upcoming appointment discussed today Continue with compliance of taking medication prescribed by Doctor I have placed a referral with Meals on Wheels, patient now place on the waiting list Please follow up with the Congregational Meal program at your local Senior Center Please review Advance Directive Placement to complete HCPOA and Living Will once received.         SDOH assessments and interventions completed:  Yes  SDOH Interventions Today    Flowsheet Row Most Recent Value  SDOH Interventions   Food Insecurity Interventions Intervention Not Indicated  [Hard to stand for very long to make meals, warms up meals alot-would like referral for meals on wheels]  Housing Interventions Intervention Not Indicated  Transportation Interventions Intervention Not Indicated  Utilities Interventions Intervention Not Indicated        Care Coordination Interventions:  Yes, provided  Interventions Today    Flowsheet Row Most Recent Value  Chronic Disease   Chronic disease during today's visit Other  [Anxiety/stress.financial  strain]  General Interventions   General Interventions Discussed/Reviewed General Interventions Discussed, [employer]  [Spoke with pt's daughter's to assess for [employer] needs. Confirmed that patient resides in her own apt. Daughters are availalbe for support , however family requesting additonal availlable resources related to food, finances, and socialization]  Exercise Interventions   Exercise Discussed/Reviewed Exercise Discussed  [Patient active with [facility] PT, rollator walker  recommended as well as adding OT]  Education Interventions   Education Provided Provided Education  Provided Verbal Education On [employer], Other  [employer] on wheels referral, congregational meals at the senior center, [employer] , [employer] and food stamp application process,]  Mental Health Interventions   Mental Health Discussed/Reviewed Mental Health Discussed, Anxiety  [Confirmed that patient has challenges with Anxiety/panic attacks. MD has prescribed medication to manage anxiety-denies follow up with therapist at this time.]  Nutrition Interventions   Nutrition Discussed/Reviewed Nutrition Discussed  [Pt's daghters assist with meals, patient uable to physcially stand and make meals,  drinks ensure]  Advanced Directive Interventions   Advanced Directives Discussed/Reviewed Advanced Care Planning, Provided resource for acquiring and filling out documents       Follow up plan: Follow up call scheduled for 04/18/23    Encounter Outcome:  Patient Visit Completed

## 2023-04-04 NOTE — Patient Instructions (Addendum)
 Visit Information  Thank you for taking time to visit with me today. Please don't hesitate to contact me if I can be of assistance to you.   Following are the goals we discussed today:   Goals Addressed             This Visit's Progress    Community resource needs to address meals, finances and mental health support       Activities and task to complete in order to accomplish goals.    Complete the referral for the Low Energy Assistance Program, food stamps and Medicaid by visiting ePASS portal at [email or web address]://hunt-[email or web address]/ Keep all upcoming appointment discussed today Continue with compliance of taking medication prescribed by Doctor I have placed a referral with Meals on Wheels, patient now place on the waiting list Please follow up with the Congregational Meal program at your local Senior Center Please review Advance Directive Placement to complete HCPOA and Living Will once received.         Our next appointment is by telephone on 04/18/23 at 9am  Please call the care guide team at [phone] if you need to cancel or reschedule your appointment.   If you are experiencing a Mental Health or Behavioral Health Crisis or need someone to talk to, please call the Suicide and Crisis Lifeline: 988   Patient verbalizes understanding of instructions and care plan provided today and agrees to view in MyChart. Active MyChart status and patient understanding of how to access instructions and care plan via MyChart confirmed with patient.     Telephone follow up appointment with care management team member scheduled for: 04/18/23  Verna Czech, LCSW [place]  [facility] Licensed Clinical Social Worker Care Coordinator  Direct Dial: [phone]

## 2023-04-07 ENCOUNTER — Telehealth: Payer: Self-pay

## 2023-04-07 DIAGNOSIS — F411 Generalized anxiety disorder: Secondary | ICD-10-CM | POA: Diagnosis not present

## 2023-04-07 DIAGNOSIS — R278 Other lack of coordination: Secondary | ICD-10-CM | POA: Diagnosis not present

## 2023-04-07 DIAGNOSIS — E785 Hyperlipidemia, unspecified: Secondary | ICD-10-CM | POA: Diagnosis not present

## 2023-04-07 DIAGNOSIS — I10 Essential (primary) hypertension: Secondary | ICD-10-CM | POA: Diagnosis not present

## 2023-04-07 DIAGNOSIS — E440 Moderate protein-calorie malnutrition: Secondary | ICD-10-CM | POA: Diagnosis not present

## 2023-04-07 DIAGNOSIS — M810 Age-related osteoporosis without current pathological fracture: Secondary | ICD-10-CM | POA: Diagnosis not present

## 2023-04-07 NOTE — Telephone Encounter (Signed)
 OK for verbals

## 2023-04-07 NOTE — Telephone Encounter (Signed)
 Advised DME team advised of incorrect equipment being received as well as request for verbal orders She verbalized understanding

## 2023-04-07 NOTE — Telephone Encounter (Signed)
 Copied from CRM [id number]. Topic: General - Other >> Apr 07, 2023  2:10 PM Shardie S wrote: Reason for CRM: States that the wrong equipment (walker) for patient was received. Requesting a pediatric rollator (with seat).  Caller/Agency: Aurelio Jew Callback Number: [phone]

## 2023-04-07 NOTE — Telephone Encounter (Signed)
 LVMTCB. Ok to provide verbals per Dr.B

## 2023-04-07 NOTE — Telephone Encounter (Signed)
 Copied from CRM [id number]. Topic: Clinical - Home Health Verbal Orders >> Apr 07, 2023  2:07 PM Izetta Dakin wrote: Caller/Agency: Aurelio Jew Callback Number: [phone] Service Requested: Physical Therapy continuation  Frequency: 1x4 and every other week x4 Any new concerns about the patient? No

## 2023-04-07 NOTE — Telephone Encounter (Signed)
 Community message has been sent advising office contacted in regards to receiving incorrect DME

## 2023-04-08 ENCOUNTER — Other Ambulatory Visit: Payer: Self-pay

## 2023-04-08 NOTE — Telephone Encounter (Signed)
 Copied from CRM [id number]. Topic: General - Other >> Apr 08, 2023 11:29 AM Marland Kitchen D wrote: Janyce Llanos called  stating the patient's order was for a rollater but  she got a standard walker. She would like to know of the DME sent the wrong walker or if the patient ordered the wrong item. Please call back at [phone]

## 2023-04-08 NOTE — Telephone Encounter (Signed)
 Verbal orders given to Aurelio Jew

## 2023-04-08 NOTE — Telephone Encounter (Signed)
 A community message was sent advising office in regards to receiving incorrect DME. HOME Health nurse was advised yesterday.  The RX states youth RW and what was shipped was a Youth RW.

## 2023-04-08 NOTE — Telephone Encounter (Signed)
 Called [place] NA/LMTCB-ok for E2C2 to give message. thanks

## 2023-04-09 DIAGNOSIS — E785 Hyperlipidemia, unspecified: Secondary | ICD-10-CM | POA: Diagnosis not present

## 2023-04-09 DIAGNOSIS — R278 Other lack of coordination: Secondary | ICD-10-CM | POA: Diagnosis not present

## 2023-04-09 DIAGNOSIS — E440 Moderate protein-calorie malnutrition: Secondary | ICD-10-CM | POA: Diagnosis not present

## 2023-04-09 DIAGNOSIS — M810 Age-related osteoporosis without current pathological fracture: Secondary | ICD-10-CM | POA: Diagnosis not present

## 2023-04-09 DIAGNOSIS — I10 Essential (primary) hypertension: Secondary | ICD-10-CM | POA: Diagnosis not present

## 2023-04-09 DIAGNOSIS — F411 Generalized anxiety disorder: Secondary | ICD-10-CM | POA: Diagnosis not present

## 2023-04-13 ENCOUNTER — Ambulatory Visit (INDEPENDENT_AMBULATORY_CARE_PROVIDER_SITE_OTHER): Payer: Medicare Other | Admitting: Emergency Medicine

## 2023-04-13 VITALS — Ht <= 58 in | Wt 80.0 lb

## 2023-04-13 DIAGNOSIS — M810 Age-related osteoporosis without current pathological fracture: Secondary | ICD-10-CM | POA: Diagnosis not present

## 2023-04-13 DIAGNOSIS — I10 Essential (primary) hypertension: Secondary | ICD-10-CM | POA: Diagnosis not present

## 2023-04-13 DIAGNOSIS — Z0000 Encounter for general adult medical examination without abnormal findings: Secondary | ICD-10-CM

## 2023-04-13 DIAGNOSIS — Z1211 Encounter for screening for malignant neoplasm of colon: Secondary | ICD-10-CM

## 2023-04-13 DIAGNOSIS — F411 Generalized anxiety disorder: Secondary | ICD-10-CM | POA: Diagnosis not present

## 2023-04-13 DIAGNOSIS — Z780 Asymptomatic menopausal state: Secondary | ICD-10-CM | POA: Diagnosis not present

## 2023-04-13 DIAGNOSIS — F17210 Nicotine dependence, cigarettes, uncomplicated: Secondary | ICD-10-CM

## 2023-04-13 DIAGNOSIS — E785 Hyperlipidemia, unspecified: Secondary | ICD-10-CM | POA: Diagnosis not present

## 2023-04-13 DIAGNOSIS — E440 Moderate protein-calorie malnutrition: Secondary | ICD-10-CM | POA: Diagnosis not present

## 2023-04-13 DIAGNOSIS — Z0001 Encounter for general adult medical examination with abnormal findings: Secondary | ICD-10-CM

## 2023-04-13 DIAGNOSIS — R278 Other lack of coordination: Secondary | ICD-10-CM | POA: Diagnosis not present

## 2023-04-13 NOTE — Progress Notes (Signed)
 Subjective:   Amanda Tucker is a 78 y.o. who presents for a Medicare Wellness preventive visit.  Visit Complete: Virtual I connected with  [name] on 04/13/23 by a audio enabled telemedicine application and verified that I am speaking with the correct person using two identifiers.  Patient Location: Home  Provider Location: Home Office  I discussed the limitations of evaluation and management by telemedicine. The patient expressed understanding and agreed to proceed.  Vital Signs: Because this visit was a virtual/telehealth visit, some criteria may be missing or patient reported. Any vitals not documented were not able to be obtained and vitals that have been documented are patient reported.  VideoDeclined- This patient declined Librarian, academic. Therefore the visit was completed with audio only.  Persons Participating in Visit: Patient.  AWV Questionnaire: No: Patient Medicare AWV questionnaire was not completed prior to this visit.  Cardiac Risk Factors include: advanced age (>72men, >64 women);dyslipidemia;hypertension;smoking/ tobacco exposure     Objective:    Today's Vitals   04/13/23 1310  Weight: 80 lb (36.3 kg)  Height: 4\' 9"  (1.448 m)   Body mass index is 17.31 kg/m.     04/13/2023    1:29 PM 04/12/2022    1:17 PM 04/07/2021    9:49 AM 04/01/2020   10:04 AM 02/05/2020    7:23 AM 12/21/2019    3:40 PM 12/21/2019   10:32 AM  Advanced Directives  Does Patient Have a Medical Advance Directive? No No No No No No No  Would patient like information on creating a medical advance directive? No - Patient declined  No - Patient declined No - Patient declined No - Patient declined  No - Patient declined    Current Medications (verified) Outpatient Encounter Medications as of 04/13/2023  Medication Sig   alendronate (FOSAMAX) 70 MG tablet Take 1 tablet (70 mg total) by mouth once a week. Take with a full glass of water on an empty stomach.    ALPRAZolam (XANAX) 0.5 MG tablet TAKE 1/2 TABLET(0.25 MG) BY MOUTH TWICE DAILY AS NEEDED FOR ANXIETY   aspirin EC 81 MG tablet Take 81 mg daily by mouth.   Aspirin-Acetaminophen-Caffeine (EXCEDRIN PO) Take 2 tablets by mouth daily as needed.   diclofenac Sodium (VOLTAREN ARTHRITIS PAIN) 1 % GEL Apply 2 g topically 4 (four) times daily as needed.   lisinopril (ZESTRIL) 40 MG tablet TAKE 1 TABLET(40 MG) BY MOUTH DAILY   Multiple Vitamin (MULTIVITAMIN ADULT PO) Take by mouth daily.   omeprazole (PRILOSEC) 20 MG capsule Take 20 mg by mouth daily. Unsure of dose   OVER THE COUNTER MEDICATION [employer] Salve Apply prn   sertraline (ZOLOFT) 50 MG tablet Take 1 tablet (50 mg total) by mouth daily.   simvastatin (ZOCOR) 40 MG tablet Take 1 tablet (40 mg total) by mouth daily at 6 PM.   VITAMIN D PO Take by mouth daily. (Patient not taking: Reported on 04/13/2023)   No facility-administered encounter medications on file as of 04/13/2023.    Allergies (verified) Patient has no known allergies.   History: Past Medical History:  Diagnosis Date   Anxiety    Arthritis    Balance problem    Depression    Diverticulitis    Environmental and seasonal allergies    GERD (gastroesophageal reflux disease)    HOH (hard of hearing)    Hyperlipidemia    Hypertension    IBS (irritable bowel syndrome)    Osteoporosis  Right sided sciatica    Vertigo    Past Surgical History:  Procedure Laterality Date   CATARACT EXTRACTION W/PHACO Right 09/07/2018   Procedure: CATARACT EXTRACTION PHACO AND INTRAOCULAR LENS PLACEMENT;  Surgeon: Elliot Cousin, MD;  Location: [facility] ORS;  Service: Ophthalmology;  Laterality: Right;  [place]: 01:08.2 CDE: 11.02 Fluid Pack Lot # [id number]   CATARACT EXTRACTION W/PHACO Left 02/01/2019   Procedure: CATARACT EXTRACTION PHACO AND INTRAOCULAR LENS PLACEMENT (IOC) LEFT VISION BLUE;  Surgeon: Elliot Cousin, MD;  Location: [facility] ORS;  Service: Ophthalmology;  Laterality:  Left;  Lot #:  [id number] H [place]:  00.51.3 CDE:  6.98   COLONOSCOPY WITH PROPOFOL N/A 02/27/2018   Procedure: COLONOSCOPY WITH PROPOFOL;  Surgeon: Wyline Mood, MD;  Location: [facility] ENDOSCOPY;  Service: Gastroenterology;  Laterality: N/A;   ESOPHAGOGASTRODUODENOSCOPY N/A 02/05/2020   Procedure: ESOPHAGOGASTRODUODENOSCOPY (EGD);  Surgeon: Regis Bill, MD;  Location[facility]COPY;  Service: Endoscopy;  Laterality: N/A;   ESOPHAGOGASTRODUODENOSCOPY (EGD) WITH PROPOFOL N/A 12/21/2019   Procedure: ESOPHAGOGASTRODUODENOSCOPY (EGD) WITH PROPOFOL;  Surgeon: Regis Bill, MD;  Location: [facility] ENDOSCOPY;  Service: Endoscopy;  Laterality: N/A;   Family History  Problem Relation Age of Onset   Dementia Mother 75   Hypertension Father    Hyperlipidemia Father    Heart attack Father 12   Pancreatic cancer Brother    CVA Maternal Grandfather    Hypothyroidism Daughter        Hashimotos   Breast cancer Neg Hx    Colon cancer Neg Hx    Social History   Socioeconomic History   Marital status: Divorced    Spouse name: Not on file   Number of children: 2   Years of education: Not on file   Highest education level: Some college, no degree  Occupational History   Occupation: retired  Tobacco Use   Smoking status: Every Day    Current packs/day: 0.50    Average packs/day: 0.5 packs/day for 59.2 years (29.6 ttl pk-yrs)    Types: Cigarettes    Start date: 02/09/1964   Smokeless tobacco: Never   Tobacco comments:    smokes 3 cigarettes out of a pack then throws away    04/13/23 smokes 7-8 cigarettes per day  Vaping Use   Vaping status: Never Used  Substance and Sexual Activity   Alcohol use: Yes    Alcohol/week: 3.0 standard drinks of alcohol    Types: 3 Glasses of wine per week    Comment: 1 glass 3 times a week   Drug use: Never   Sexual activity: Never  Other Topics Concern   Not on file  Social History Narrative   Not on file   Social Drivers of Health   Financial Resource  Strain: Low Risk  (04/13/2023)   Overall Financial Resource Strain (CARDIA)    Difficulty of Paying Living Expenses: Not hard at all  Food Insecurity: No Food Insecurity (04/13/2023)   Hunger Vital Sign    Worried About Running Out of Food in the Last Year: Never true    Ran Out of Food in the Last Year: Never true  Transportation Needs: No Transportation Needs (04/13/2023)   PRAPARE - Administrator, Civil Service (Medical): No    Lack of Transportation (Non-Medical): No  Physical Activity: Insufficiently Active (04/13/2023)   Exercise Vital Sign    Days of Exercise per Week: 5 days    Minutes of Exercise per Session: 20 min  Stress: No Stress Concern Present (04/13/2023)  [employer] of Occupational Health - Occupational Stress Questionnaire    Feeling of Stress : Only a little  Social Connections: Socially Isolated (04/13/2023)   Social Connection and Isolation Panel [NHANES]    Frequency of Communication with Friends and Family: More than three times a week    Frequency of Social Gatherings with Friends and Family: Three times a week    Attends Religious Services: Never    Active Member of Clubs or Organizations: No    Attends Banker Meetings: Never    Marital Status: Divorced    Tobacco Counseling Ready to quit: Not Answered Counseling given: Not Answered Tobacco comments: smokes 3 cigarettes out of a pack then throws away 04/13/23 smokes 7-8 cigarettes per day    Clinical Intake:  Pre-visit preparation completed: Yes  Pain : No/denies pain     BMI - recorded: 17.31 Nutritional Status: BMI <19  Underweight Nutritional Risks: None Diabetes: No  No results found for: "HGBA1C"   How often do you need to have someone help you when you read instructions, pamphlets, or other written materials from your doctor or pharmacy?: 1 - Never  Interpreter Needed?: No  Information entered by :: Tora Kindred, CMA   Activities of Daily Living      04/13/2023    1:13 PM 04/22/2022    9:49 AM  In your present state of health, do you have any difficulty performing the following activities:  Hearing? 0 1  Vision? 0 0  Difficulty concentrating or making decisions? 0 0  Walking or climbing stairs? 1 0  Comment trying to get a rollator   Dressing or bathing? 0 0  Doing errands, shopping? 0 0  Preparing Food and eating ? N   Using the Toilet? N   In the past six months, have you accidently leaked urine? Y   Comment wears depend   Do you have problems with loss of bowel control? N   Managing your Medications? N   Managing your Finances? N   Housekeeping or managing your Housekeeping? N     Patient Care Team: Erasmo Downer, MD as PCP - General (Family Medicine) [facility], [facility] (Optometry) Mia Creek, Rossie Muskrat, MD as Consulting Physician (Gastroenterology) Wyline Mood, MD as Consulting Physician (Gastroenterology)  Indicate any recent Medical Services you may have received from other than [facility] providers in the past year (date may be approximate).     Assessment:   [facility]  Hearing/Vision screen Hearing Screening - Comments:: Denies hearing loss Vision Screening - Comments:: Last eye exam ~ 3 yrs ago, gave list of local eye doctors   Goals Addressed             This Visit's Progress    Patient Stated       Continue to work on getting balance better       Depression Screen     04/13/2023    1:26 PM 03/22/2023    1:13 PM 04/22/2022    9:48 AM 04/12/2022    1:12 PM 01/11/2022    8:41 AM 07/09/2021    8:13 AM 04/07/2021   10:24 AM  PHQ 2/9 Scores  PHQ - 2 Score 0 0 0 0 0 0 1  PHQ- 9 Score 1  3  2 5 6     Fall Risk     04/13/2023    1:32 PM 04/04/2023    9:11 AM 03/22/2023    1:13 PM  04/22/2022    9:48 AM 04/12/2022    1:07 PM  Fall Risk   Falls in the past year? 1 1 0 0   Number falls in past yr: 1 1 0 0   Injury with Fall? 1 0 0 0   Risk for fall due to :  History of fall(s);Impaired balance/gait;Orthopedic patient History of fall(s);Impaired balance/gait No Fall Risks No Fall Risks No Fall Risks  Follow up Falls prevention discussed;Falls evaluation completed;Education provided  Falls evaluation completed Falls evaluation completed Education provided;Falls prevention discussed    MEDICARE RISK AT HOME:  Medicare Risk at Home Any stairs in or around the home?: Yes (1 step) If so, are there any without handrails?: Yes Home free of loose throw rugs in walkways, pet beds, electrical cords, etc?: Yes Adequate lighting in your home to reduce risk of falls?: Yes Life alert?: No Use of a cane, walker or w/c?: No Grab bars in the bathroom?: Yes Shower chair or bench in shower?: No Elevated toilet seat or a handicapped toilet?: Yes  TIMED UP AND GO:  Was the test performed?  No  Cognitive Function: 6CIT completed        04/13/2023    1:34 PM 04/12/2022    1:27 PM 03/01/2019    9:58 AM 12/07/2017    9:43 AM  6CIT Screen  What Year? 0 points 0 points 0 points 0 points  What month? 0 points 0 points 0 points 0 points  What time? 0 points 0 points 0 points 0 points  Count back from 20 2 points 0 points 0 points 0 points  Months in reverse 0 points 0 points 0 points 0 points  Repeat phrase 2 points 0 points 0 points 2 points  Total Score 4 points 0 points 0 points 2 points    Immunizations Immunization History  Administered Date(s) Administered   Fluad Quad(high Dose 65+) 10/18/2019, 10/07/2020, 01/11/2022   Influenza, High Dose Seasonal PF 12/01/2016, 12/07/2017   Influenza,inj,Quad PF,6+ Mos 11/26/2018   Influenza-Unspecified 10/30/2022   Moderna Covid-19 Vaccine Bivalent Booster 68yrs & up 03/01/2021   PFIZER(Purple Top)SARS-COV-2 Vaccination 03/09/2019, 04/03/2019, 10/25/2019   Pfizer Covid-19 Vaccine Bivalent Booster 29yrs & up 05/17/2020, 10/30/2022   Pfizer(Comirnaty)Fall Seasonal Vaccine 12 years and older 01/11/2022    Pneumococcal Conjugate-13 12/01/2016   Pneumococcal Polysaccharide-23 12/07/2017    Screening Tests Health Maintenance  Topic Date Due   DTaP/Tdap/Td (1 - Tdap) Never done   Zoster Vaccines- Shingrix (1 of 2) Never done   DEXA SCAN  05/28/2022   COVID-19 Vaccine (8 - 2024-25 season) 12/25/2022   Colonoscopy  02/28/2023   MAMMOGRAM  04/23/2023   Medicare Annual Wellness (AWV)  04/12/2024   Pneumonia Vaccine 85+ Years old  Completed   INFLUENZA VACCINE  Completed   Hepatitis C Screening  Completed   HPV VACCINES  Aged Out    Health Maintenance  Health Maintenance Due  Topic Date Due   DTaP/Tdap/Td (1 - Tdap) Never done   Zoster Vaccines- Shingrix (1 of 2) Never done   DEXA SCAN  05/28/2022   COVID-19 Vaccine (8 - 2024-25 season) 12/25/2022   Colonoscopy  02/28/2023   Health Maintenance Items Addressed: DEXA ordered, Referral sent to GI for colonoscopy, Referral sent to [facility] (smoker/hx smoking), See Nurse Notes  Additional Screening:  Vision Screening: Recommended annual ophthalmology exams for early detection of glaucoma and other disorders of the eye.  Dental Screening: Recommended annual dental exams for proper oral hygiene  Community Resource Referral / Chronic Care Management: CRR required this visit?  No   CCM required this visit?  No     Plan:     I have personally reviewed and noted the following in the patient's chart:   Medical and social history Use of alcohol, tobacco or illicit drugs  Current medications and supplements including opioid prescriptions. Patient is not currently taking opioid prescriptions. Functional ability and status Nutritional status Physical activity Advanced directives List of other physicians Hospitalizations, surgeries, and ER visits in previous 12 months Vitals Screenings to include cognitive, depression, and falls Referrals and appointments  In addition, I have reviewed and discussed with patient certain  preventive protocols, quality metrics, and best practice recommendations. A written personalized care plan for preventive services as well as general preventive health recommendations were provided to patient.     Tora Kindred, CMA   04/13/2023   After Visit Summary: (MyChart) Due to this being a telephonic visit, the after visit summary with patients personalized plan was offered to patient via MyChart   Notes:  6 CIT Score - 4 Placed referral to Pulmonology for LDCT Placed order for DEXA scan Placed order for a colonoscopy (last 02/27/18, 5 year repeat per GI) Declined Tdap and shingles vaccine Declined MMG due to age

## 2023-04-13 NOTE — Patient Instructions (Addendum)
 Amanda Tucker , Thank you for taking time to come for your Medicare Wellness Visit. I appreciate your ongoing commitment to your health goals. Please review the following plan we discussed and let me know if I can assist you in the future.   Referrals/Orders/Follow-Ups/Clinician Recommendations:   I have placed a referral for a colonoscopy that is due now. Someone from there office will call you to schedule appointment.  I have placed and order for a bone density test. Call [facility] @ [phone] to schedule.  I have placed a referral to [facility] to evaluate for the need of a low dose lung CT to screen for lung cancer. Someone from their office will call you. You should having an eye exam every 2 years. I have included a list of local eye doctors.  This is a list of the screening recommended for you and due dates:  Health Maintenance  Topic Date Due   DTaP/Tdap/Td vaccine (1 - Tdap) Never done   Screening for Lung Cancer  Never done   Zoster (Shingles) Vaccine (1 of 2) Never done   DEXA scan (bone density measurement)  05/28/2022   COVID-19 Vaccine (8 - 2024-25 season) 12/25/2022   Colon Cancer Screening  02/28/2023   Mammogram  04/23/2023   Medicare Annual Wellness Visit  04/12/2024   Pneumonia Vaccine  Completed   Flu Shot  Completed   Hepatitis C Screening  Completed   HPV Vaccine  Aged Out    Advanced directives: (Declined) Advance directive discussed with you today. Even though you declined this today, please call our office should you change your mind, and we can give you the proper paperwork for you to fill out.  Next Medicare Annual Wellness Visit scheduled for next year: Yes, 04/18/24 @ 1:10pm (phone visit)  There are several Eye Doctors in your area. Here are a few that usually accept all insurance types:  [facility] [address] [place], [place] [address] Phone: [phone]  [facility] [address] [place], [place]  [address] Phone: [phone]  [facility] [address] [place], [place] [address] Phone: [phone]  [facility] [address] [place], [place] [address] Phone: [phone]  [facility] [address] [place], [place] [address] Phone: [phone]  [facility] [address] [place], [place] [address] Phone: [phone]  Please let us know if you require a referral for an eye exam appointment. Thank you!   Fall Prevention in the Home, Adult Falls can cause injuries and affect people of all ages. There are many simple things that you can do to make your home safe and to help prevent falls. If you need it, ask for help making these changes. What actions can I take to prevent falls? General information Use good lighting in all rooms. Make sure to: Replace any light bulbs that burn out. Turn on lights if it is dark and use night-lights. Keep items that you use often in easy-to-reach places. Lower the shelves around your home if needed. Move furniture so that there are clear paths around it. Do not keep throw rugs or other things on the floor that can make you trip. If any of your floors are uneven, fix them. Add color or contrast paint or tape to clearly mark and help you see: Grab bars or handrails. First and last steps of staircases. Where the edge of each step is. If you use a ladder or stepladder: Make sure that  it is fully opened. Do not climb a closed ladder. Make sure the sides of the ladder are locked in place. Have someone hold the ladder while you use it. Know where your pets are as you move through your home. What can I do in the bathroom?     Keep the floor dry. Clean up any water that is on the floor right away. Remove soap buildup in the bathtub or shower. Buildup makes bathtubs and showers slippery. Use non-skid mats or decals on the floor of the bathtub or shower. Attach bath mats securely with double-sided, non-slip rug tape. If you need to sit  down while you are in the shower, use a non-slip stool. Install grab bars by the toilet and in the bathtub and shower. Do not use towel bars as grab bars. What can I do in the bedroom? Make sure that you have a light by your bed that is easy to reach. Do not use any sheets or blankets on your bed that hang to the floor. Have a firm bench or chair with side arms that you can use for support when you get dressed. What can I do in the kitchen? Clean up any spills right away. If you need to reach something above you, use a sturdy step stool that has a grab bar. Keep electrical cables out of the way. Do not use floor polish or wax that makes floors slippery. What can I do with my stairs? Do not leave anything on the stairs. Make sure that you have a light switch at the top and the bottom of the stairs. Have them installed if you do not have them. Make sure that there are handrails on both sides of the stairs. Fix handrails that are broken or loose. Make sure that handrails are as long as the staircases. Install non-slip stair treads on all stairs in your home if they do not have carpet. Avoid having throw rugs at the top or bottom of stairs, or secure the rugs with carpet tape to prevent them from moving. Choose a carpet design that does not hide the edge of steps on the stairs. Make sure that carpet is firmly attached to the stairs. Fix any carpet that is loose or worn. What can I do on the outside of my home? Use bright outdoor lighting. Repair the edges of walkways and driveways and fix any cracks. Clear paths of anything that can make you trip, such as tools or rocks. Add color or contrast paint or tape to clearly mark and help you see high doorway thresholds. Trim any bushes or trees on the main path into your home. Check that handrails are securely fastened and in good repair. Both sides of all steps should have handrails. Install guardrails along the edges of any raised decks or  porches. Have leaves, snow, and ice cleared regularly. Use sand, salt, or ice melt on walkways during winter months if you live where there is ice and snow. In the garage, clean up any spills right away, including grease or oil spills. What other actions can I take? Review your medicines with your health care provider. Some medicines can make you confused or feel dizzy. This can increase your chance of falling. Wear closed-toe shoes that fit well and support your feet. Wear shoes that have rubber soles and low heels. Use a cane, walker, scooter, or crutches that help you move around if needed. Talk with your provider about other ways that you can decrease your  risk of falls. This may include seeing a physical therapist to learn to do exercises to improve movement and strength. Where to find more information Centers for Disease Control and Prevention, STEADI: TonerPromos.no [employer] on Aging: BaseRingTones.pl National Institute on Aging: BaseRingTones.pl Contact a health care provider if: You are afraid of falling at home. You feel weak, drowsy, or dizzy at home. You fall at home. Get help right away if you: Lose consciousness or have trouble moving after a fall. Have a fall that causes a head injury. These symptoms may be an emergency. Get help right away. Call 911. Do not wait to see if the symptoms will go away. Do not drive yourself to the hospital. This information is not intended to replace advice given to you by your health care provider. Make sure you discuss any questions you have with your health care provider. Document Revised: 09/14/2021 Document Reviewed: 09/14/2021 Elsevier Patient Education  2024 [employer].

## 2023-04-14 ENCOUNTER — Telehealth: Payer: Self-pay

## 2023-04-14 NOTE — Telephone Encounter (Signed)
 Copied from CRM [id number]. Topic: General - Other >> Apr 13, 2023 11:51 AM Marland Kitchen D wrote:  [facility] wanted to let the office know  Patient had a fall yesterday and has a scrape

## 2023-04-18 ENCOUNTER — Telehealth: Payer: Self-pay

## 2023-04-18 ENCOUNTER — Ambulatory Visit: Payer: Self-pay | Admitting: *Deleted

## 2023-04-18 NOTE — Telephone Encounter (Signed)
 Spoke with [place] and she reports rollator has not been received and still needing to know how to properly return the walker that they did receive. Advised I will try and contact the DME by phone if I am able to obtain contact information and get back with her as soon as possible. She verbalized understanding

## 2023-04-18 NOTE — Telephone Encounter (Signed)
 Copied from CRM [id number]. Topic: General - Other >> Apr 15, 2023  8:32 AM Almira Coaster wrote: Reason for EAV:[id number], patient's daughter is calling to speak with Joseline regarding the rollator that was supposed to be delivered, advised of the message that was left from 04/08/2023 but she still has some questions for joseline. Best call back number [phone]

## 2023-04-18 NOTE — Patient Outreach (Signed)
 Care Coordination   Follow Up Visit Note   04/18/2023 Name: Amanda Tucker MRN: [id number] DOB: 12-08-1945  Amanda Tucker is a 78 y.o. year old female who sees Bacigalupo, Marzella Schlein, MD for primary care. I spoke with  Amanda Tucker 's daughters by phone today.  What matters to the patients health and wellness today?  Patient's daughter discussed continued plan to complete application process for the [employer] Assistance Program as well as Medicaid. They are also requesting follow up with the Meals on Wheels program. Patient received the incorrect walker and will need to switch it out for a pediatric rollator through [employer]. Once patient's mobility improves, patient will be referred to the congregational meal program through the [facility]. Advanced Directive Packet re-sent to family for review. Patient remains on psychotropic medications to manage anxiety symptoms.   Goals Addressed             This Visit's Progress    Community resource needs to address meals, finances and mental health support       Activities and task to complete in order to accomplish goals.    Continue plan to complete the referral for the Low Energy Assistance Program, food stamps and Medicaid by visiting ePASS portal at epass.https://hunt-bailey.com/ Keep all upcoming appointment discussed today Continue with compliance of taking medication prescribed by Doctor I have placed a 2nd referral with Meals on Wheels, patient now place on the waiting list Please go to [employer] to switch out standard walker to pediatric rollator walker [address] [place], [place] [address] [phone] Please follow up with the Congregational Meal program at your local [facility] Please review Advance Directive Placement to complete HCPOA and Living Will once received.         SDOH assessments and interventions completed:  No     Care Coordination Interventions:  Yes, provided  Interventions Today    Flowsheet Row Most Recent Value  Chronic Disease    Chronic disease during today's visit Other  [anxiety, stress, financial strain]  General Interventions   General Interventions Discussed/Reviewed General Interventions Reviewed, [employer], Horticulturist, commercial (DME)  [Pt assessed for [employer] needs. Confirmed that pt's family continues to work on [employer] application, pt to continue work with [facility] PT to improve mobility-Junior rollator walker recommended but basic rollator received-CSW to follow up with [employer]]  Durable Medical Equipment (DME) Gwynneth Macleod contacted to confirm DME order for junior rollator walker-standard walker delivered daughter agreeable to present to the local branch [address] [place], [address] 9-4pm to switch out DME]  Exercise Interventions   Exercise Discussed/Reviewed Exercise Discussed  [PT active with [facility] for PT-junior rollator walker recommended]  Mental Health Interventions   Mental Health Discussed/Reviewed Mental Health Discussed, Anxiety  [confirned that patient continues to take medications for anxiety-unsure of status of panic attacks, per daughter"I am sure she has had them but may have not admitted to it"]  Nutrition Interventions   Nutrition Discussed/Reviewed Nutrition Discussed  [2nd referral for Meals on Wheels completed, phone number updated-both daughter's contact number provided]  Advanced Directive Interventions   Advanced Directives Discussed/Reviewed Advanced Directives Discussed, Provided resource for acquiring and filling out documents  [confirmed that POA was not received-2nd packet mailed]       Follow up plan: Follow up call scheduled for 04/29/23    Encounter Outcome:  Patient Visit Completed

## 2023-04-18 NOTE — Patient Instructions (Signed)
 Visit Information  Thank you for taking time to visit with me today. Please don't hesitate to contact me if I can be of assistance to you.   Following are the goals we discussed today:   Goals Addressed             This Visit's Progress    Community resource needs to address meals, finances and mental health support       Activities and task to complete in order to accomplish goals.    Continue plan to complete the referral for the Low Energy Assistance Program, food stamps and Medicaid by visiting ePASS portal at epass.https://hunt-bailey.com/ Keep all upcoming appointment discussed today Continue with compliance of taking medication prescribed by Doctor I have placed a 2nd referral with Meals on Wheels, patient now place on the waiting list Please go to [employer] to switch out standard walker to pediatric rollator walker [address] [place], [place] [address] [phone] Please follow up with the Congregational Meal program at your local [facility] Please review Advance Directive Placement to complete HCPOA and Living Will once received.         Our next appointment is by telephone on 04/29/23 at 11  Please call the care guide team at [phone] if you need to cancel or reschedule your appointment.   If you are experiencing a Mental Health or Behavioral Health Crisis or need someone to talk to, please call 911   Patient verbalizes understanding of instructions and care plan provided today and agrees to view in MyChart. Active MyChart status and patient understanding of how to access instructions and care plan via MyChart confirmed with patient.     Telephone follow up appointment with care management team member scheduled for: 04/29/23    Verna Czech, LCSW [place]  [facility], [facility] Licensed Clinical Social Worker Care Coordinator  Direct Dial: [phone]

## 2023-04-22 DIAGNOSIS — F411 Generalized anxiety disorder: Secondary | ICD-10-CM | POA: Diagnosis not present

## 2023-04-22 DIAGNOSIS — I10 Essential (primary) hypertension: Secondary | ICD-10-CM | POA: Diagnosis not present

## 2023-04-22 DIAGNOSIS — R278 Other lack of coordination: Secondary | ICD-10-CM | POA: Diagnosis not present

## 2023-04-22 DIAGNOSIS — E440 Moderate protein-calorie malnutrition: Secondary | ICD-10-CM | POA: Diagnosis not present

## 2023-04-22 DIAGNOSIS — M810 Age-related osteoporosis without current pathological fracture: Secondary | ICD-10-CM | POA: Diagnosis not present

## 2023-04-22 DIAGNOSIS — E785 Hyperlipidemia, unspecified: Secondary | ICD-10-CM | POA: Diagnosis not present

## 2023-04-22 NOTE — Telephone Encounter (Signed)
 Amanda Tucker reports she received a call and issue has been resolved

## 2023-04-25 ENCOUNTER — Ambulatory Visit: Payer: Self-pay

## 2023-04-25 DIAGNOSIS — F411 Generalized anxiety disorder: Secondary | ICD-10-CM | POA: Diagnosis not present

## 2023-04-25 DIAGNOSIS — M810 Age-related osteoporosis without current pathological fracture: Secondary | ICD-10-CM | POA: Diagnosis not present

## 2023-04-25 DIAGNOSIS — E440 Moderate protein-calorie malnutrition: Secondary | ICD-10-CM | POA: Diagnosis not present

## 2023-04-25 DIAGNOSIS — I10 Essential (primary) hypertension: Secondary | ICD-10-CM | POA: Diagnosis not present

## 2023-04-25 DIAGNOSIS — E785 Hyperlipidemia, unspecified: Secondary | ICD-10-CM | POA: Diagnosis not present

## 2023-04-25 DIAGNOSIS — R278 Other lack of coordination: Secondary | ICD-10-CM | POA: Diagnosis not present

## 2023-04-25 NOTE — Patient Outreach (Signed)
 Care Coordination   04/25/2023 Name: Alila Sotero MRN: [id number] DOB: [birth year]   Care Coordination Outreach Attempts:  Successful contact was made with patient, Racquel Arkin.  Patient states she is getting ready for home health PT services to arrive and will therefore not be able to do phone appointment with RN case manager.  Patient request to reschedule.   Follow Up Plan:  Additional outreach attempts will be made to offer the patient complex care management information and services.  Patient rescheduled for 04/27/23.   Encounter Outcome:  Patient Request to Call Back   Care Coordination Interventions:  No, not indicated    George Ina RN, BSN, CCM   [facility], Population Health Case Manager Phone: [phone]

## 2023-04-27 ENCOUNTER — Ambulatory Visit: Payer: Self-pay

## 2023-04-27 NOTE — Patient Instructions (Signed)
 Visit Information  Thank you for taking time to visit with me today. Please don't hesitate to contact me if I can be of assistance to you.   Following are the goals we discussed today:   Goals Addressed             This Visit's Progress    Patient will work with CCM team to gain knowledge and help with managment of health conditions.       Interventions Today    Flowsheet Row Most Recent Value  Chronic Disease   Chronic disease during today's visit Other  [balance issues, falls, anxiety]  General Interventions   General Interventions Discussed/Reviewed General Interventions Discussed, Doctor Visits  [evaluation of current treatment plan for listed health conditions and patients adherence to plan as established by provider. Assessed blood pressure readings and for any new/ ongoing symptoms.]  Doctor Visits Discussed/Reviewed Doctor Visits Discussed  [reviewed upcoming provider visits. Advised to keep follow up appointments with providers as recommended.]  Durable Medical Equipment (DME) Val Riles patient received pediatric rollator.]  Exercise Interventions   Exercise Discussed/Reviewed Physical Activity  [Assessed if receiving home health PT. Advised patient to do instructed PT exercises on on therapy days.]  Education Interventions   Education Provided Provided Education, Provided Printed Education  [Discussed benefit of monitoring blood pressures at home periodically. Encourage to monitor blood pressures and reports blood pressure readings outside of established parameters. Fall prevention article sent to patient in Mychart.]  Mental Health Interventions   Mental Health Discussed/Reviewed Mental Health Discussed  [Discussed patients anxiety.  Discussed what patient feels is causing her anxiety to be triggered. Advised  to continue to take anxiety medication as prescribed.]  Pharmacy Interventions   Pharmacy Dicussed/Reviewed Pharmacy Topics Discussed  [medication list  reviewed. Confirmed patient has all medications and takes them as prescribed. Advised ongoing adherence to medications. Discussed strategies for remember medications such as setting alarms, note on refrigerator or asking family to assist.]  Safety Interventions   Safety Discussed/Reviewed Fall Risk, Safety Discussed  [falls assessed. Fall prevention/ home safety discussed. Confirmed patient received  pediatric rollatory. Advised to continue using ambulatory device as recommended by provider.]  Advanced Directive Interventions   Advanced Directives Discussed/Reviewed Advanced Directives Discussed  [[employer]iance directive addressed. Per chart review social worker sending packet to patient.]         =        Our next appointment is by telephone on 06/02/23 at 10 am  Please call the care guide team at [phone] if you need to cancel or reschedule your appointment.   If you are experiencing a Mental Health or Behavioral Health Crisis or need someone to talk to, please call the Suicide and Crisis Lifeline: 988 call 1-800-273-TALK (toll free, 24 hour hotline)  Patient verbalizes understanding of instructions and care plan provided today and agrees to view in MyChart. Active MyChart status and patient understanding of how to access instructions and care plan via MyChart confirmed with patient.     George Ina RN, BSN, CCM [place]  [facility], Population Health Case Manager Phone: [phone]  Fall Prevention in the Home, Adult Falls can cause injuries and can happen to people of all ages. There are many things you can do to make your home safer and to help prevent falls. What actions can I take to prevent falls? General information Use good lighting in all rooms. Make sure to: Replace any light bulbs that burn out. Turn on the lights  in dark areas and use night-lights. Keep items that you use often in easy-to-reach places. Lower the shelves around your home if  needed. Move furniture so that there are clear paths around it. Do not use throw rugs or other things on the floor that can make you trip. If any of your floors are uneven, fix them. Add color or contrast paint or tape to clearly mark and help you see: Grab bars or handrails. First and last steps of staircases. Where the edge of each step is. If you use a ladder or stepladder: Make sure that it is fully opened. Do not climb a closed ladder. Make sure the sides of the ladder are locked in place. Have someone hold the ladder while you use it. Know where your pets are as you move through your home. What can I do in the bathroom?     Keep the floor dry. Clean up any water on the floor right away. Remove soap buildup in the bathtub or shower. Buildup makes bathtubs and showers slippery. Use non-skid mats or decals on the floor of the bathtub or shower. Attach bath mats securely with double-sided, non-slip rug tape. If you need to sit down in the shower, use a non-slip stool. Install grab bars by the toilet and in the bathtub and shower. Do not use towel bars as grab bars. What can I do in the bedroom? Make sure that you have a light by your bed that is easy to reach. Do not use any sheets or blankets on your bed that hang to the floor. Have a firm chair or bench with side arms that you can use for support when you get dressed. What can I do in the kitchen? Clean up any spills right away. If you need to reach something above you, use a step stool with a grab bar. Keep electrical cords out of the way. Do not use floor polish or wax that makes floors slippery. What can I do with my stairs? Do not leave anything on the stairs. Make sure that you have a light switch at the top and the bottom of the stairs. Make sure that there are handrails on both sides of the stairs. Fix handrails that are broken or loose. Install non-slip stair treads on all your stairs if they do not have carpet. Avoid  having throw rugs at the top or bottom of the stairs. Choose a carpet that does not hide the edge of the steps on the stairs. Make sure that the carpet is firmly attached to the stairs. Fix carpet that is loose or worn. What can I do on the outside of my home? Use bright outdoor lighting. Fix the edges of walkways and driveways and fix any cracks. Clear paths of anything that can make you trip, such as tools or rocks. Add color or contrast paint or tape to clearly mark and help you see anything that might make you trip as you walk through a door, such as a raised step or threshold. Trim any bushes or trees on paths to your home. Check to see if handrails are loose or broken and that both sides of all steps have handrails. Install guardrails along the edges of any raised decks and porches. Have leaves, snow, or ice cleared regularly. Use sand, salt, or ice melter on paths if you live where there is ice and snow during the winter. Clean up any spills in your garage right away. This includes grease or oil spills.  What other actions can I take? Review your medicines with your doctor. Some medicines can cause dizziness or changes in blood pressure, which increase your risk of falling. Wear shoes that: Have a low heel. Do not wear high heels. Have rubber bottoms and are closed at the toe. Feel good on your feet and fit well. Use tools that help you move around if needed. These include: Canes. Walkers. Scooters. Crutches. Ask your doctor what else you can do to help prevent falls. This may include seeing a physical therapist to learn to do exercises to move better and get stronger. Where to find more information Centers for Disease Control and Prevention, STEADI: TonerPromos.no [employer] on Aging: BaseRingTones.pl National Institute on Aging: BaseRingTones.pl Contact a doctor if: You are afraid of falling at home. You feel weak, drowsy, or dizzy at home. You fall at home. Get help right away if  you: Lose consciousness or have trouble moving after a fall. Have a fall that causes a head injury. These symptoms may be an emergency. Get help right away. Call 911. Do not wait to see if the symptoms will go away. Do not drive yourself to the hospital. This information is not intended to replace advice given to you by your health care provider. Make sure you discuss any questions you have with your health care provider. Document Revised: 09/14/2021 Document Reviewed: 09/14/2021 Elsevier Patient Education  2024 [employer].

## 2023-04-27 NOTE — Patient Outreach (Signed)
 Care Coordination   Initial Visit Note   04/27/2023 Name: Amanda Tucker MRN: [id number] DOB: 1945-03-03  Amanda Tucker is a 78 y.o. year old female who sees Bacigalupo, Marzella Schlein, MD for primary care. I spoke with  [name] by phone today.  What matters to the patients health and wellness today?  Patient reports receiving her pediatric Rollator. She states she is very pleased with this and states it is much easier for her to manage. Patient states she has had issues with her balance and sustained falls.  She states she has not had any falls in the past 4 months. She states she feels that some of her issue is she has fear of falling.  Patient states she is working with home PT 1 x per week. She states this seems to be helping her as well. She reports doing the PT recommended exercises on off PT days.  Patient states she is not checking her blood pressure however reports her physical therapist has checked and record.  Patient reports recent blood pressures have been: 118/86, 140/80, 128/78, 118/80.   Patient reports taking all of her medications as prescribed.  She states she occasionally forgets to take her fosamax. Patient states her anxiety at times can go to panic attacks. She states she is not sure what the triggers are however she feels the overwhelming panic at times if she has to walk far.      Goals Addressed             This Visit's Progress    Patient will work with CCM team to gain knowledge and help with managment of health conditions.       Interventions Today    Flowsheet Row Most Recent Value  Chronic Disease   Chronic disease during today's visit Other  [balance issues, falls, anxiety]  General Interventions   General Interventions Discussed/Reviewed General Interventions Discussed, Doctor Visits  [evaluation of current treatment plan for listed health conditions and patients adherence to plan as established by provider. Assessed blood pressure readings and for any new/ ongoing  symptoms.]  Doctor Visits Discussed/Reviewed Doctor Visits Discussed  [reviewed upcoming provider visits. Advised to keep follow up appointments with providers as recommended.]  Durable Medical Equipment (DME) Val Riles patient received pediatric rollator.]  Exercise Interventions   Exercise Discussed/Reviewed Physical Activity  [Assessed if receiving home health PT. Advised patient to do instructed PT exercises on on therapy days.]  Education Interventions   Education Provided Provided Education, Provided Printed Education  [Discussed benefit of monitoring blood pressures at home periodically. Encourage to monitor blood pressures and reports blood pressure readings outside of established parameters. Fall prevention article sent to patient in Mychart.]  Mental Health Interventions   Mental Health Discussed/Reviewed Mental Health Discussed  [Discussed patients anxiety.  Discussed what patient feels is causing her anxiety to be triggered. Advised  to continue to take anxiety medication as prescribed.]  Pharmacy Interventions   Pharmacy Dicussed/Reviewed Pharmacy Topics Discussed  [medication list reviewed. Confirmed patient has all medications and takes them as prescribed. Advised ongoing adherence to medications. Discussed strategies for remember medications such as setting alarms, note on refrigerator or asking family to assist.]  Safety Interventions   Safety Discussed/Reviewed Fall Risk, Safety Discussed  [falls assessed. Fall prevention/ home safety discussed. Confirmed patient received  pediatric rollatory. Advised to continue using ambulatory device as recommended by provider.]  Advanced Directive Interventions   Advanced Directives Discussed/Reviewed Advanced Directives Discussed  [[employer]iance directive addressed. Per chart  review Child psychotherapist sending packet to patient.]         =        SDOH assessments and interventions completed:  Yes  SDOH Interventions Today     Flowsheet Row Most Recent Value  SDOH Interventions   Food Insecurity Interventions Intervention Not Indicated  Housing Interventions Intervention Not Indicated  Transportation Interventions Intervention Not Indicated  Utilities Interventions Intervention Not Indicated        Care Coordination Interventions:  Yes, provided   Follow up plan: Follow up call scheduled for 06/02/23 at 10 am.     Encounter Outcome:  Patient Visit Completed   George Ina RN, BSN, CCM [place]  [facility], Population Health Case Manager Phone: [phone]

## 2023-04-28 ENCOUNTER — Other Ambulatory Visit: Payer: Self-pay | Admitting: Family Medicine

## 2023-04-28 ENCOUNTER — Telehealth: Payer: Self-pay | Admitting: Family Medicine

## 2023-04-28 DIAGNOSIS — F411 Generalized anxiety disorder: Secondary | ICD-10-CM

## 2023-04-28 DIAGNOSIS — G4700 Insomnia, unspecified: Secondary | ICD-10-CM

## 2023-04-28 MED ORDER — SERTRALINE HCL 50 MG PO TABS: 50.0000 mg | ORAL_TABLET | Freq: Every day | ORAL | 3 refills | Status: AC

## 2023-04-28 NOTE — Telephone Encounter (Signed)
 Copied from CRM [id number]. Topic: Clinical - Prescription Issue >> Apr 28, 2023 10:34 AM Antwanette L wrote: Reason for CRM: The patient needs a prior authorization for ALPRAZolam (XANAX) 0.5 MG tablet, sertraline (ZOLOFT) 50 MG tablet, simvastatin (ZOCOR) 40 MG tablet , and alendronate (FOSAMAX) 70 MG tablet.  The patient can be contacted by phone at [phone].  Preferred Pharmacy [id number] - [place], [place] - [address] AT [facility] [address] [facility] [address] [place] [address] Phone: [phone] Fax: [phone]

## 2023-04-28 NOTE — Telephone Encounter (Signed)
[facility] faxed refill request for the following medications:  sertraline (ZOLOFT) 50 MG tablet    Please advise.  

## 2023-04-28 NOTE — Telephone Encounter (Signed)
 Copied from CRM [id number]. Topic: Clinical - Medication Refill >> Apr 28, 2023  9:34 AM Shon Hale wrote: Most Recent Primary Care Visit:  Provider: Tora Kindred  Department: [facility]  Visit Type: MEDICARE AWV, SEQUENTIAL  Date: 04/13/2023  Medication: ALPRAZolam (XANAX) 0.5 MG tablet  Has the patient contacted their pharmacy? Yes Told to contact office.  Is this the correct pharmacy for this prescription? Yes This is the patient's preferred pharmacy:  [id number] - [place], [place] - [address] AT [facility] [address] [facility] [address] [place] [address] Phone: [phone] Fax: [phone]  Has the prescription been filled recently? No  Is the patient out of the medication? Yes  Has the patient been seen for an appointment in the last year OR does the patient have an upcoming appointment? Yes  Can we respond through MyChart? No  Agent: Please be advised that Rx refills may take up to 3 business days. We ask that you follow-up with your pharmacy.

## 2023-04-29 ENCOUNTER — Encounter: Payer: Self-pay | Admitting: *Deleted

## 2023-04-29 ENCOUNTER — Other Ambulatory Visit: Payer: Self-pay | Admitting: Family Medicine

## 2023-04-29 ENCOUNTER — Telehealth: Payer: Self-pay | Admitting: *Deleted

## 2023-04-29 NOTE — Patient Outreach (Signed)
 Care Coordination   04/29/2023 Name: Amanda Tucker MRN: [id number] DOB: 10-09-1945   Care Coordination Outreach Attempts:  An unsuccessful telephone outreach was attempted today to offer the patient information about available complex care management services.  Follow Up Plan:  Additional outreach attempts will be made to offer the patient complex care management information and services.   Encounter Outcome:  No Answer   Care Coordination Interventions:  No, not indicated    Uva Runkel, LCSW [place]  [facility], [facility] Licensed Clinical Social Worker Care Coordinator  Direct Dial: [phone]

## 2023-04-29 NOTE — Telephone Encounter (Signed)
 Copied from CRM [id number]. Topic: Clinical - Medication Question >> Apr 29, 2023  1:34 PM Marland Kitchen D wrote: Patient would like a call when her prescription has been sent to the pharmacy

## 2023-04-29 NOTE — Telephone Encounter (Signed)
 Requested Prescriptions  Pending Prescriptions Disp Refills   alendronate (FOSAMAX) 70 MG tablet [Pharmacy Med Name: ALENDRONATE 70MG  TABLETS] 4 tablet 11    Sig: TAKE 1 TABLET(70 MG) BY MOUTH 1 TIME A WEEK WITH A FULL GLASS OF WATER AND ON AN EMPTY STOMACH     Endocrinology:  Bisphosphonates Failed - 04/29/2023 11:25 AM      Failed - Vitamin D in normal range and within 360 days    Vit D, 25-Hydroxy  Date Value Ref Range Status  02/11/2022 48.3 30.0 - 100.0 ng/mL Final    Comment:    Vitamin D deficiency has been defined by the Institute of Medicine and an Endocrine Society practice guideline as a level of serum 25-OH vitamin D less than 20 ng/mL (1,2). The Endocrine Society went on to further define vitamin D insufficiency as a level between 21 and 29 ng/mL (2). 1. IOM (Institute of Medicine). 2010. Dietary reference    intakes for calcium and D. [place]: The    [employer]. 2. Holick MF, Binkley [place], Bischoff-Ferrari HA, et al.    Evaluation, treatment, and prevention of vitamin D    deficiency: an Endocrine Society clinical practice    guideline. JCEM. 2011 Jul; 96(7):1911-30.          Failed - Cr in normal range and within 360 days    Creat  Date Value Ref Range Status  12/14/2016 0.62 0.60 - 0.93 mg/dL Final    Comment:    For patients >64 years of age, the reference limit for Creatinine is approximately 13% higher for people identified as African-American. .    Creatinine, Ser  Date Value Ref Range Status  11/16/2022 0.52 (L) 0.57 - 1.00 mg/dL Final         Failed - Mg Level in normal range and within 360 days    No results found for: "MG"       Failed - Phosphate in normal range and within 360 days    No results found for: "PHOS"       Failed - Bone Mineral Density or Dexa Scan completed in the last 2 years      Passed - Ca in normal range and within 360 days    Calcium  Date Value Ref Range Status  11/16/2022 9.1 8.7 - 10.3 mg/dL Final          Passed - eGFR is 30 or above and within 360 days    GFR, Est African American  Date Value Ref Range Status  12/14/2016 104 > OR = 60 mL/min/1.43m2 Final   GFR calc Af Amer  Date Value Ref Range Status  10/18/2019 108 >59 mL/min/1.73 Final    Comment:    **[facility] currently reports eGFR in compliance with the current**   recommendations of the [employer]. [facility] will   update reporting as new guidelines are published from the NKF-ASN   Task force.    GFR, Est Non African American  Date Value Ref Range Status  12/14/2016 90 > OR = 60 mL/min/1.27m2 Final   GFR, Estimated  Date Value Ref Range Status  12/21/2019 >60 >60 mL/min Final    Comment:    (NOTE) Calculated using the CKD-EPI Creatinine Equation (2021)    eGFR  Date Value Ref Range Status  11/16/2022 95 >59 mL/min/1.73 Final         Passed - Valid encounter within last 12 months    Recent Outpatient Visits  1 month ago Imbalance   [facility] [place], Marzella Schlein, MD       Future Appointments             In 2 weeks Bacigalupo, Marzella Schlein, MD [facility], [facility]             simvastatin (ZOCOR) 40 MG tablet [Pharmacy Med Name: SIMVASTATIN 40MG  TABLETS] 90 tablet 1    Sig: TAKE 1 TABLET(40 MG) BY MOUTH DAILY AT 6 PM     Cardiovascular:  Antilipid - Statins Failed - 04/29/2023 11:25 AM      Failed - Lipid Panel in normal range within the last 12 months    Cholesterol, Total  Date Value Ref Range Status  11/16/2022 167 100 - 199 mg/dL Final   LDL Cholesterol (Calc)  Date Value Ref Range Status  12/14/2016 97 mg/dL (calc) Final    Comment:    Reference range: <100 . Desirable range <100 mg/dL for primary prevention;   <70 mg/dL for patients with CHD or diabetic patients  with > or = 2 CHD risk factors. Marland Kitchen LDL-C is now calculated using the Martin-Hopkins  calculation, which is a validated novel method providing  better  accuracy than the Friedewald equation in the  estimation of LDL-C.  Horald Pollen et al. Lenox Ahr. 4098;119(14): 2061-2068  (http://education.[employer].com/faq/FAQ164)    LDL Chol Calc (NIH)  Date Value Ref Range Status  11/16/2022 78 0 - 99 mg/dL Final   HDL  Date Value Ref Range Status  11/16/2022 71 >39 mg/dL Final   Triglycerides  Date Value Ref Range Status  11/16/2022 98 0 - 149 mg/dL Final         Passed - Patient is not pregnant      Passed - Valid encounter within last 12 months    Recent Outpatient Visits           1 month ago Imbalance   [place] [facility] [place], Marzella Schlein, MD       Future Appointments             In 2 weeks Bacigalupo, Marzella Schlein, MD [facility], [facility]             ALPRAZolam (XANAX) 0.5 MG tablet 30 tablet 5    Sig: TAKE 1/2 TABLET(0.25 MG) BY MOUTH TWICE DAILY AS NEEDED FOR ANXIETY     Not Delegated - Psychiatry: Anxiolytics/Hypnotics 2 Failed - 04/29/2023 11:25 AM      Failed - This refill cannot be delegated      Failed - Urine Drug Screen completed in last 360 days      Passed - Patient is not pregnant      Passed - Valid encounter within last 6 months    Recent Outpatient Visits           1 month ago Imbalance   [place] [facility] [place], Marzella Schlein, MD       Future Appointments             In 2 weeks Bacigalupo, Marzella Schlein, MD [facility], [facility]

## 2023-04-29 NOTE — Telephone Encounter (Signed)
 Requested medication (s) are due for refill today: Yes  Requested medication (s) are on the active medication list: Yes  Last refill:  Fosamax 04/18/22 #4, 11 refills, Xanax 10/28/22 #30, 5 refills   Future visit scheduled: Yes  Notes to clinic:  Xanax Unable to refill per protocol, cannot delegate. Fosamax Unable to refill per protocol due to failed labs, no updated results.      Requested Prescriptions  Pending Prescriptions Disp Refills   alendronate (FOSAMAX) 70 MG tablet [Pharmacy Med Name: ALENDRONATE 70MG  TABLETS] 4 tablet 11    Sig: TAKE 1 TABLET(70 MG) BY MOUTH 1 TIME A WEEK WITH A FULL GLASS OF WATER AND ON AN EMPTY STOMACH     Endocrinology:  Bisphosphonates Failed - 04/29/2023 11:25 AM      Failed - Vitamin D in normal range and within 360 days    Vit D, 25-Hydroxy  Date Value Ref Range Status  02/11/2022 48.3 30.0 - 100.0 ng/mL Final    Comment:    Vitamin D deficiency has been defined by the Institute of Medicine and an Endocrine Society practice guideline as a level of serum 25-OH vitamin D less than 20 ng/mL (1,2). The Endocrine Society went on to further define vitamin D insufficiency as a level between 21 and 29 ng/mL (2). 1. IOM (Institute of Medicine). 2010. Dietary reference    intakes for calcium and D. [place]: The    [employer]. 2. Holick MF, Binkley [place], Bischoff-Ferrari HA, et al.    Evaluation, treatment, and prevention of vitamin D    deficiency: an Endocrine Society clinical practice    guideline. JCEM. 2011 Jul; 96(7):1911-30.          Failed - Cr in normal range and within 360 days    Creat  Date Value Ref Range Status  12/14/2016 0.62 0.60 - 0.93 mg/dL Final    Comment:    For patients >1 years of age, the reference limit for Creatinine is approximately 13% higher for people identified as African-American. .    Creatinine, Ser  Date Value Ref Range Status  11/16/2022 0.52 (L) 0.57 - 1.00 mg/dL Final         Failed  - Mg Level in normal range and within 360 days    No results found for: "MG"       Failed - Phosphate in normal range and within 360 days    No results found for: "PHOS"       Failed - Bone Mineral Density or Dexa Scan completed in the last 2 years      Passed - Ca in normal range and within 360 days    Calcium  Date Value Ref Range Status  11/16/2022 9.1 8.7 - 10.3 mg/dL Final         Passed - eGFR is 30 or above and within 360 days    GFR, Est African American  Date Value Ref Range Status  12/14/2016 104 > OR = 60 mL/min/1.47m2 Final   GFR calc Af Amer  Date Value Ref Range Status  10/18/2019 108 >59 mL/min/1.73 Final    Comment:    **[facility] currently reports eGFR in compliance with the current**   recommendations of the [employer]. [facility] will   update reporting as new guidelines are published from the NKF-ASN   Task force.    GFR, Est Non African American  Date Value Ref Range Status  12/14/2016 90 > OR = 60 mL/min/1.40m2 Final  GFR, Estimated  Date Value Ref Range Status  12/21/2019 >60 >60 mL/min Final    Comment:    (NOTE) Calculated using the CKD-EPI Creatinine Equation (2021)    eGFR  Date Value Ref Range Status  11/16/2022 95 >59 mL/min/1.73 Final         Passed - Valid encounter within last 12 months    Recent Outpatient Visits           1 month ago Imbalance   [facility] [place], Marzella Schlein, MD       Future Appointments             In 2 weeks Bacigalupo, Marzella Schlein, MD [facility], [facility]             ALPRAZolam (XANAX) 0.5 MG tablet 30 tablet 5    Sig: TAKE 1/2 TABLET(0.25 MG) BY MOUTH TWICE DAILY AS NEEDED FOR ANXIETY     Not Delegated - Psychiatry: Anxiolytics/Hypnotics 2 Failed - 04/29/2023 11:25 AM      Failed - This refill cannot be delegated      Failed - Urine Drug Screen completed in last 360 days      Passed - Patient is not pregnant      Passed - Valid  encounter within last 6 months    Recent Outpatient Visits           1 month ago Imbalance   [place] [facility] [place], Marzella Schlein, MD       Future Appointments             In 2 weeks Bacigalupo, Marzella Schlein, MD [facility], [facility]            Signed Prescriptions Disp Refills   simvastatin (ZOCOR) 40 MG tablet 90 tablet 1    Sig: TAKE 1 TABLET(40 MG) BY MOUTH DAILY AT 6 PM     Cardiovascular:  Antilipid - Statins Failed - 04/29/2023 11:25 AM      Failed - Lipid Panel in normal range within the last 12 months    Cholesterol, Total  Date Value Ref Range Status  11/16/2022 167 100 - 199 mg/dL Final   LDL Cholesterol (Calc)  Date Value Ref Range Status  12/14/2016 97 mg/dL (calc) Final    Comment:    Reference range: <100 . Desirable range <100 mg/dL for primary prevention;   <70 mg/dL for patients with CHD or diabetic patients  with > or = 2 CHD risk factors. Marland Kitchen LDL-C is now calculated using the Martin-Hopkins  calculation, which is a validated novel method providing  better accuracy than the Friedewald equation in the  estimation of LDL-C.  Horald Pollen et al. Lenox Ahr. 4098;119(14): 2061-2068  (http://education.[facility].com/faq/FAQ164)    LDL Chol Calc (NIH)  Date Value Ref Range Status  11/16/2022 78 0 - 99 mg/dL Final   HDL  Date Value Ref Range Status  11/16/2022 71 >39 mg/dL Final   Triglycerides  Date Value Ref Range Status  11/16/2022 98 0 - 149 mg/dL Final         Passed - Patient is not pregnant      Passed - Valid encounter within last 12 months    Recent Outpatient Visits           1 month ago Imbalance   [place] [facility] [place], Marzella Schlein, MD       Future Appointments  In 2 weeks Bacigalupo, Marzella Schlein, MD [facility], [facility]

## 2023-04-29 NOTE — Telephone Encounter (Signed)
 Requested medications are due for refill today.  yes  Requested medications are on the active medications list.  yes  Last refill. 10/28/2022 #30 5 rf  Future visit scheduled.   yes  Notes to clinic.  Refill not delegated.    Requested Prescriptions  Pending Prescriptions Disp Refills   ALPRAZolam (XANAX) 0.5 MG tablet [Pharmacy Med Name: ALPRAZOLAM 0.5MG  TABLETS] 30 tablet     Sig: TAKE 1/2 TABLET(0.25 MG) BY MOUTH TWICE DAILY AS NEEDED FOR ANXIETY     Not Delegated - Psychiatry: Anxiolytics/Hypnotics 2 Failed - 04/29/2023 12:54 PM      Failed - This refill cannot be delegated      Failed - Urine Drug Screen completed in last 360 days      Passed - Patient is not pregnant      Passed - Valid encounter within last 6 months    Recent Outpatient Visits           1 month ago Imbalance   [place] [facility] [place], Marzella Schlein, MD       Future Appointments             In 2 weeks Bacigalupo, Marzella Schlein, MD [facility], [facility]

## 2023-04-29 NOTE — Telephone Encounter (Signed)
 Copied from CRM [id number]. Topic: Clinical - Medication Refill >> Apr 29, 2023 12:06 PM Alessandra Bevels wrote: Most Recent Primary Care Visit:  Provider: Tora Kindred  Department: [facility]  Visit Type: MEDICARE AWV, SEQUENTIAL  Date: 04/13/2023  Medication: ALPRAZolam (XANAX) 0.5 MG tablet [[id number]]  alendronate (FOSAMAX) 70 MG tablet [Pharmacy Med Name: ALENDRONATE 70MG  TABLETS] 4 tablet 11      Sig: TAKE 1 TABLET(70 MG) BY MOUTH 1 TIME A WEEK WITH A FULL GLASS OF WATER AND ON AN EMPTY STOMACH    Has the patient contacted their pharmacy? Yes (Agent: If no, request that the patient contact the pharmacy for the refill. If patient does not wish to contact the pharmacy document the reason why and proceed with request.) (Agent: If yes, when and what did the pharmacy advise?)  Is this the correct pharmacy for this prescription? Yes If no, delete pharmacy and type the correct one.  This is the patient's preferred pharmacy:  [id number] - [place], [place] - [address] AT [facility] [address] [place] [address] Phone: [phone] Fax: [phone]   Has the prescription been filled recently? Yes  Is the patient out of the medication? Yes  Has the patient been seen for an appointment in the last year OR does the patient have an upcoming appointment? Yes  Can we respond through MyChart? Yes  Agent: Please be advised that Rx refills may take up to 3 business days. We ask that you follow-up with your pharmacy.

## 2023-04-29 NOTE — Telephone Encounter (Signed)
 Requested medication (s) are due for refill today: yes  Requested medication (s) are on the active medication list: yes  Last refill:  Alendronate: 4 tab 11 RF          alprazolam: 10/28/22 #30 5 RF  Future visit scheduled: yes  Notes to clinic:  Alendronate: overdue labs            alprazolam: not delegated to NT to RF   Requested Prescriptions  Pending Prescriptions Disp Refills   alendronate (FOSAMAX) 70 MG tablet [Pharmacy Med Name: ALENDRONATE 70MG  TABLETS] 4 tablet 11    Sig: TAKE 1 TABLET(70 MG) BY MOUTH 1 TIME A WEEK WITH A FULL GLASS OF WATER AND ON AN EMPTY STOMACH     Endocrinology:  Bisphosphonates Failed - 04/29/2023 11:27 AM      Failed - Vitamin D in normal range and within 360 days    Vit D, 25-Hydroxy  Date Value Ref Range Status  02/11/2022 48.3 30.0 - 100.0 ng/mL Final    Comment:    Vitamin D deficiency has been defined by the Institute of Medicine and an Endocrine Society practice guideline as a level of serum 25-OH vitamin D less than 20 ng/mL (1,2). The Endocrine Society went on to further define vitamin D insufficiency as a level between 21 and 29 ng/mL (2). 1. IOM (Institute of Medicine). 2010. Dietary reference    intakes for calcium and D. [place]: The    [employer]. 2. Holick MF, Binkley [place], Bischoff-Ferrari HA, et al.    Evaluation, treatment, and prevention of vitamin D    deficiency: an Endocrine Society clinical practice    guideline. JCEM. 2011 Jul; 96(7):1911-30.          Failed - Cr in normal range and within 360 days    Creat  Date Value Ref Range Status  12/14/2016 0.62 0.60 - 0.93 mg/dL Final    Comment:    For patients >56 years of age, the reference limit for Creatinine is approximately 13% higher for people identified as African-American. .    Creatinine, Ser  Date Value Ref Range Status  11/16/2022 0.52 (L) 0.57 - 1.00 mg/dL Final         Failed - Mg Level in normal range and within 360 days    No  results found for: "MG"       Failed - Phosphate in normal range and within 360 days    No results found for: "PHOS"       Failed - Bone Mineral Density or Dexa Scan completed in the last 2 years      Passed - Ca in normal range and within 360 days    Calcium  Date Value Ref Range Status  11/16/2022 9.1 8.7 - 10.3 mg/dL Final         Passed - eGFR is 30 or above and within 360 days    GFR, Est African American  Date Value Ref Range Status  12/14/2016 104 > OR = 60 mL/min/1.14m2 Final   GFR calc Af Amer  Date Value Ref Range Status  10/18/2019 108 >59 mL/min/1.73 Final    Comment:    **[facility] currently reports eGFR in compliance with the current**   recommendations of the [employer]. [facility] will   update reporting as new guidelines are published from the NKF-ASN   Task force.    GFR, Est Non African American  Date Value Ref Range Status  12/14/2016 90 > OR =  60 mL/min/1.53m2 Final   GFR, Estimated  Date Value Ref Range Status  12/21/2019 >60 >60 mL/min Final    Comment:    (NOTE) Calculated using the CKD-EPI Creatinine Equation (2021)    eGFR  Date Value Ref Range Status  11/16/2022 95 >59 mL/min/1.73 Final         Passed - Valid encounter within last 12 months    Recent Outpatient Visits           1 month ago Imbalance   [facility] [place], Marzella Schlein, MD       Future Appointments             In 2 weeks Bacigalupo, Marzella Schlein, MD [facility], [facility]             ALPRAZolam (XANAX) 0.5 MG tablet 30 tablet 5    Sig: TAKE 1/2 TABLET(0.25 MG) BY MOUTH TWICE DAILY AS NEEDED FOR ANXIETY     Not Delegated - Psychiatry: Anxiolytics/Hypnotics 2 Failed - 04/29/2023 11:27 AM      Failed - This refill cannot be delegated      Failed - Urine Drug Screen completed in last 360 days      Passed - Patient is not pregnant      Passed - Valid encounter within last 6 months    Recent Outpatient  Visits           1 month ago Imbalance   [place] [facility] [place], Marzella Schlein, MD       Future Appointments             In 2 weeks Bacigalupo, Marzella Schlein, MD [facility], [facility]            Signed Prescriptions Disp Refills   simvastatin (ZOCOR) 40 MG tablet 90 tablet 1    Sig: TAKE 1 TABLET(40 MG) BY MOUTH DAILY AT 6 PM     Cardiovascular:  Antilipid - Statins Failed - 04/29/2023 11:27 AM      Failed - Lipid Panel in normal range within the last 12 months    Cholesterol, Total  Date Value Ref Range Status  11/16/2022 167 100 - 199 mg/dL Final   LDL Cholesterol (Calc)  Date Value Ref Range Status  12/14/2016 97 mg/dL (calc) Final    Comment:    Reference range: <100 . Desirable range <100 mg/dL for primary prevention;   <70 mg/dL for patients with CHD or diabetic patients  with > or = 2 CHD risk factors. Marland Kitchen LDL-C is now calculated using the Martin-Hopkins  calculation, which is a validated novel method providing  better accuracy than the Friedewald equation in the  estimation of LDL-C.  Horald Pollen et al. Len[place] Ahr. 9147;829(56): 2061-2068  (http://education.[facility].com/faq/FAQ164)    LDL Chol Calc (NIH)  Date Value Ref Range Status  11/16/2022 78 0 - 99 mg/dL Final   HDL  Date Value Ref Range Status  11/16/2022 71 >39 mg/dL Final   Triglycerides  Date Value Ref Range Status  11/16/2022 98 0 - 149 mg/dL Final         Passed - Patient is not pregnant      Passed - Valid encounter within last 12 months    Recent Outpatient Visits           1 month ago Imbalance   [place] [facility], Marzella Schlein, MD       Future Appointments  In 2 weeks Bacigalupo, Marzella Schlein, MD [facility], [facility]

## 2023-05-01 ENCOUNTER — Other Ambulatory Visit: Payer: Self-pay | Admitting: Family Medicine

## 2023-05-02 MED ORDER — ALPRAZOLAM 0.5 MG PO TABS: ORAL_TABLET | ORAL | 5 refills | Status: DC

## 2023-05-03 NOTE — Telephone Encounter (Signed)
 Rxs were both sent to pharmacy by provider.   Requested Prescriptions  Refused Prescriptions Disp Refills   alendronate (FOSAMAX) 70 MG tablet [Pharmacy Med Name: ALENDRONATE 70MG  TABLETS] 4 tablet 11    Sig: TAKE 1 TABLET(70 MG) BY MOUTH 1 TIME A WEEK WITH A FULL GLASS OF WATER AND ON AN EMPTY STOMACH     Endocrinology:  Bisphosphonates Failed - 05/03/2023  9:47 AM      Failed - Vitamin D in normal range and within 360 days    Vit D, 25-Hydroxy  Date Value Ref Range Status  02/11/2022 48.3 30.0 - 100.0 ng/mL Final    Comment:    Vitamin D deficiency has been defined by the Institute of Medicine and an Endocrine Society practice guideline as a level of serum 25-OH vitamin D less than 20 ng/mL (1,2). The Endocrine Society went on to further define vitamin D insufficiency as a level between 21 and 29 ng/mL (2). 1. IOM (Institute of Medicine). 2010. Dietary reference    intakes for calcium and D. [place]: The    [employer]. 2. Holick MF, Binkley [place], Bischoff-Ferrari HA, et al.    Evaluation, treatment, and prevention of vitamin D    deficiency: an Endocrine Society clinical practice    guideline. JCEM. 2011 Jul; 96(7):1911-30.          Failed - Cr in normal range and within 360 days    Creat  Date Value Ref Range Status  12/14/2016 0.62 0.60 - 0.93 mg/dL Final    Comment:    For patients >62 years of age, the reference limit for Creatinine is approximately 13% higher for people identified as African-American. .    Creatinine, Ser  Date Value Ref Range Status  11/16/2022 0.52 (L) 0.57 - 1.00 mg/dL Final         Failed - Mg Level in normal range and within 360 days    No results found for: "MG"       Failed - Phosphate in normal range and within 360 days    No results found for: "PHOS"       Failed - Bone Mineral Density or Dexa Scan completed in the last 2 years      Passed - Ca in normal range and within 360 days    Calcium  Date Value Ref Range  Status  11/16/2022 9.1 8.7 - 10.3 mg/dL Final         Passed - eGFR is 30 or above and within 360 days    GFR, Est African American  Date Value Ref Range Status  12/14/2016 104 > OR = 60 mL/min/1.30m2 Final   GFR calc Af Amer  Date Value Ref Range Status  10/18/2019 108 >59 mL/min/1.73 Final    Comment:    **[facility] currently reports eGFR in compliance with the current**   recommendations of the [employer]. [facility] will   update reporting as new guidelines are published from the NKF-ASN   Task force.    GFR, Est Non African American  Date Value Ref Range Status  12/14/2016 90 > OR = 60 mL/min/1.70m2 Final   GFR, Estimated  Date Value Ref Range Status  12/21/2019 >60 >60 mL/min Final    Comment:    (NOTE) Calculated using the CKD-EPI Creatinine Equation (2021)    eGFR  Date Value Ref Range Status  11/16/2022 95 >59 mL/min/1.73 Final         Passed - Valid encounter within last  12 months    Recent Outpatient Visits           1 month ago Imbalance   [facility] [place], Marzella Schlein, MD       Future Appointments             In 2 weeks Bacigalupo, Marzella Schlein, MD [facility], [facility]             ALPRAZolam Prudy Feeler) 0.5 MG tablet [Pharmacy Med Name: ALPRAZOLAM 0.5MG  TABLETS] 30 tablet     Sig: TAKE 1/2 TABLET(0.25 MG) BY MOUTH TWICE DAILY AS NEEDED FOR ANXIETY     Not Delegated - Psychiatry: Anxiolytics/Hypnotics 2 Failed - 05/03/2023  9:47 AM      Failed - This refill cannot be delegated      Failed - Urine Drug Screen completed in last 360 days      Passed - Patient is not pregnant      Passed - Valid encounter within last 6 months    Recent Outpatient Visits           1 month ago Imbalance   [facility] [place], Marzella Schlein, MD       Future Appointments             In 2 weeks Bacigalupo, Marzella Schlein, MD [facility], [facility]

## 2023-05-09 DIAGNOSIS — M810 Age-related osteoporosis without current pathological fracture: Secondary | ICD-10-CM | POA: Diagnosis not present

## 2023-05-09 DIAGNOSIS — E785 Hyperlipidemia, unspecified: Secondary | ICD-10-CM | POA: Diagnosis not present

## 2023-05-09 DIAGNOSIS — R278 Other lack of coordination: Secondary | ICD-10-CM | POA: Diagnosis not present

## 2023-05-09 DIAGNOSIS — E440 Moderate protein-calorie malnutrition: Secondary | ICD-10-CM | POA: Diagnosis not present

## 2023-05-09 DIAGNOSIS — F411 Generalized anxiety disorder: Secondary | ICD-10-CM | POA: Diagnosis not present

## 2023-05-09 DIAGNOSIS — I10 Essential (primary) hypertension: Secondary | ICD-10-CM | POA: Diagnosis not present

## 2023-05-12 ENCOUNTER — Other Ambulatory Visit

## 2023-05-17 ENCOUNTER — Ambulatory Visit: Payer: Self-pay | Admitting: Family Medicine

## 2023-05-17 ENCOUNTER — Encounter: Payer: Self-pay | Admitting: Family Medicine

## 2023-05-17 VITALS — BP 143/82 | HR 70 | Ht <= 58 in | Wt 78.3 lb

## 2023-05-17 DIAGNOSIS — E440 Moderate protein-calorie malnutrition: Secondary | ICD-10-CM | POA: Diagnosis not present

## 2023-05-17 DIAGNOSIS — M810 Age-related osteoporosis without current pathological fracture: Secondary | ICD-10-CM | POA: Diagnosis not present

## 2023-05-17 DIAGNOSIS — I10 Essential (primary) hypertension: Secondary | ICD-10-CM | POA: Diagnosis not present

## 2023-05-17 DIAGNOSIS — F411 Generalized anxiety disorder: Secondary | ICD-10-CM | POA: Diagnosis not present

## 2023-05-17 DIAGNOSIS — R636 Underweight: Secondary | ICD-10-CM

## 2023-05-17 DIAGNOSIS — E782 Mixed hyperlipidemia: Secondary | ICD-10-CM

## 2023-05-17 MED ORDER — MIRTAZAPINE 7.5 MG PO TABS: 7.5000 mg | ORAL_TABLET | Freq: Every day | ORAL | 2 refills | Status: DC

## 2023-05-17 NOTE — Assessment & Plan Note (Signed)
 Blood pressure reading today was 143/82 mmHg, slightly elevated. Suspected that home readings may be lower. Current management is deemed adequate. - Encourage home blood pressure monitoring with a pediatric cuff if possible

## 2023-05-17 NOTE — Assessment & Plan Note (Signed)
 Persistent difficulty in gaining weight, currently under 80 pounds. Discussed the use of Remeron  (mirtazapine ) to increase appetite and promote weight gain. Dietary strategies include high-fat, calorie-dense meals and easy-to-prepare foods to improve motivation to eat. - Initiate Remeron  (mirtazapine ) 7.5 mg daily in the evening with simvastatin  - Encourage consumption of high-fat, calorie-dense meals - Consider stocking freezer with easy-to-microwave meals

## 2023-05-17 NOTE — Assessment & Plan Note (Signed)
 Chronic anxiety with fluctuations in severity. Current management includes Zoloft  and Xanax  as needed. Discussed adding Remeron  (mirtazapine ) for its anxiolytic effects and potential to aid in weight gain. - Continue Zoloft  50 mg daily - Continue Xanax  0.25 mg BID PRN - Initiate Remeron  (mirtazapine ) 7.5 mg daily in the evening  - Monitor for increased appetite and weight gain - Reassess anxiety control and weight gain in 2 months

## 2023-05-17 NOTE — Progress Notes (Signed)
 Established patient visit   Patient: Amanda Tucker   DOB: 08-31-1945   78 y.o. Female  MRN: [id number] Visit Date: 05/17/2023  Today's healthcare provider: Aden Agreste, MD   Chief Complaint  Patient presents with   Medical Management of Chronic Issues    Pt daughter Amanda Tucker reports concern of weight not being able to get above 80 lbs and reports she feels her mother is anxious everyday   Hypertension    Pt does not monitor and reports no symptoms   Hyperlipidemia    No symptoms to report   Anxiety    She reports excellent compliance with treatment. She reports excellent tolerance of treatment. She is not having side effects.  She feels her anxiety is moderate and unchanged since last visit. She reports symptoms of insomnia, panic attacks, and racing thoughts if she does not take xanax  at night   Subjective    HPI HPI     Medical Management of Chronic Issues    Additional comments: Pt daughter Amanda Tucker reports concern of weight not being able to get above 80 lbs and reports she feels her mother is anxious everyday        Hypertension    Additional comments: Pt does not monitor and reports no symptoms        Hyperlipidemia    Additional comments: No symptoms to report        Anxiety    Additional comments: She reports excellent compliance with treatment. She reports excellent tolerance of treatment. She is not having side effects.  She feels her anxiety is moderate and unchanged since last visit. She reports symptoms of insomnia, panic attacks, and racing thoughts if she does not take xanax  at night      Last edited by Pasty Bongo, CMA on 05/17/2023 11:16 AM.       Discussed the use of AI scribe software for clinical note transcription with the patient, who gave verbal consent to proceed.  History of Present Illness   The patient, a 78 year old female with a history of hypertension, osteoporosis, generalized anxiety disorder, hyperlipidemia,  protein calorie malnutrition, and underweight, presents for a chronic follow-up. The patient reports that her anxiety levels fluctuate daily, with some days being better than others. She describes her anxiety as a baseline state, often leading to overthinking and worry. The patient's anxiety is currently managed with Xanax  and Zoloft .  The patient's weight remains a concern, as she struggles to maintain a weight over 80 pounds. Despite efforts to increase her caloric intake, the patient's weight remains low. She reports a lack of motivation to prepare meals for herself, often resulting in her not eating much after 3 pm. The patient also reports using an adult diaper pull-up for security when she is out, indicating a possible issue with urinary incontinence.  The patient's daughter is present during the visit and provides additional insight into the patient's daily habits and struggles. She notes that the patient often lacks the motivation to prepare meals for herself, which may contribute to her low weight. The patient's daughter also assists with carrying the patient's purse, as it is heavy and could potentially throw the patient off balance.          05/17/2023   11:13 AM 04/13/2023    1:26 PM 03/22/2023    1:13 PM 04/22/2022    9:48 AM 04/12/2022    1:12 PM  Depression screen PHQ 2/9  Decreased Interest 0 0 0 0 0  Down, Depressed, Hopeless 0 0 0 0 0  PHQ - 2 Score 0 0 0 0 0  Altered sleeping 1 1  1    Tired, decreased energy 1 0  1   Change in appetite 1 0  1   Feeling bad or failure about yourself  0 0  0   Trouble concentrating 0 0  0   Moving slowly or fidgety/restless 0 0  0   Suicidal thoughts 0 0  0   PHQ-9 Score 3 1  3    Difficult doing work/chores Not difficult at all Not difficult at all  Not difficult at all        05/17/2023   11:13 AM 03/22/2023    1:13 PM 04/22/2022    9:49 AM 10/07/2020    9:59 AM  GAD 7 : Generalized Anxiety Score  Nervous, Anxious, on Edge 1 2 1 1    Control/stop worrying 1 3 1  0  Worry too much - different things 2 3 1 1   Trouble relaxing 0 3 0 0  Restless 0 0 0 0  Easily annoyed or irritable 0 0 0 0  Afraid - awful might happen 1 3 3 1   Total GAD 7 Score 5 14 6 3   Anxiety Difficulty Not difficult at all Somewhat difficult Not difficult at all Not difficult at all      Medications: Outpatient Medications Prior to Visit  Medication Sig   alendronate  (FOSAMAX ) 70 MG tablet TAKE 1 TABLET(70 MG) BY MOUTH 1 TIME A WEEK WITH A FULL GLASS OF WATER AND ON AN EMPTY STOMACH   ALPRAZolam  (XANAX ) 0.5 MG tablet TAKE 1/2 TABLET(0.25 MG) BY MOUTH TWICE DAILY AS NEEDED FOR ANXIETY   aspirin EC 81 MG tablet Take 81 mg daily by mouth.   Aspirin-Acetaminophen-Caffeine (EXCEDRIN PO) Take 2 tablets by mouth daily as needed.   diclofenac Sodium (VOLTAREN ARTHRITIS PAIN) 1 % GEL Apply 2 g topically 4 (four) times daily as needed.   lisinopril  (ZESTRIL ) 40 MG tablet TAKE 1 TABLET(40 MG) BY MOUTH DAILY   Multiple Vitamin (MULTIVITAMIN ADULT PO) Take by mouth daily.   omeprazole (PRILOSEC) 20 MG capsule Take 20 mg by mouth daily. Unsure of dose   OVER THE COUNTER MEDICATION [employer] Apply prn   sertraline  (ZOLOFT ) 50 MG tablet Take 1 tablet (50 mg total) by mouth daily.   simvastatin  (ZOCOR ) 40 MG tablet TAKE 1 TABLET(40 MG) BY MOUTH DAILY AT 6 PM   VITAMIN D  PO Take by mouth daily.   [DISCONTINUED] ALPRAZolam  (XANAX ) 0.5 MG tablet TAKE 1/2 TABLET(0.25 MG) BY MOUTH TWICE DAILY AS NEEDED FOR ANXIETY   No facility-administered medications prior to visit.    Review of Systems     Objective    BP (!) 143/82 (BP Location: Left Arm, Patient Position: Sitting, Cuff Size: Small)   Pulse 70   Ht 4\' 9"  (1.448 m)   Wt 78 lb 4.8 oz (35.5 kg)   SpO2 99%   BMI 16.94 kg/m    Physical Exam Vitals reviewed.  Constitutional:      General: She is not in acute distress.    Appearance: She is well-developed. She is not diaphoretic.   HENT:     Head: Normocephalic and atraumatic.  Eyes:     General: No scleral icterus.    Conjunctiva/sclera: Conjunctivae normal.  Neck:     Thyroid : No thyromegaly.  Cardiovascular:     Rate and Rhythm: Normal rate and regular rhythm.  Heart sounds: Normal heart sounds. No murmur heard. Pulmonary:     Effort: Pulmonary effort is normal. No respiratory distress.     Breath sounds: Normal breath sounds. No wheezing, rhonchi or rales.  Musculoskeletal:     Cervical back: Neck supple.     Right lower leg: No edema.     Left lower leg: No edema.  Lymphadenopathy:     Cervical: No cervical adenopathy.  Skin:    General: Skin is warm and dry.     Findings: No rash.  Neurological:     Mental Status: She is alert and oriented to person, place, and time. Mental status is at baseline.  Psychiatric:        Mood and Affect: Mood normal.        Behavior: Behavior normal.      No results found for any visits on 05/17/23.  Assessment & Plan     Problem List Items Addressed This Visit       Cardiovascular and Mediastinum   Hypertension - Primary   Blood pressure reading today was 143/82 mmHg, slightly elevated. Suspected that home readings may be lower. Current management is deemed adequate. - Encourage home blood pressure monitoring with a pediatric cuff if possible      Relevant Orders   Basic Metabolic Panel (BMET)     Musculoskeletal and Integument   Age-related osteoporosis without current pathological fracture   Chronic condition. Discussed the importance of maintaining weight to provide additional padding and reduce fracture risk.        Other   Hyperlipidemia   Cholesterol levels were last checked in October and were within normal limits. Current dietary focus is on weight gain, so cholesterol levels are not being closely monitored.      Generalized anxiety disorder   Chronic anxiety with fluctuations in severity. Current management includes Zoloft  and Xanax  as  needed. Discussed adding Remeron  (mirtazapine ) for its anxiolytic effects and potential to aid in weight gain. - Continue Zoloft  50 mg daily - Continue Xanax  0.25 mg BID PRN - Initiate Remeron  (mirtazapine ) 7.5 mg daily in the evening  - Monitor for increased appetite and weight gain - Reassess anxiety control and weight gain in 2 months      Relevant Medications   mirtazapine  (REMERON ) 7.5 MG tablet   Moderate protein-calorie malnutrition (HCC)   Persistent difficulty in gaining weight, currently under 80 pounds. Discussed the use of Remeron  (mirtazapine ) to increase appetite and promote weight gain. Dietary strategies include high-fat, calorie-dense meals and easy-to-prepare foods to improve motivation to eat. - Initiate Remeron  (mirtazapine ) 7.5 mg daily in the evening with simvastatin  - Encourage consumption of high-fat, calorie-dense meals - Consider stocking freezer with easy-to-microwave meals      Underweight        Follow-up Plan to reassess weight, blood pressure, and response to Remeron  in 2 months. - Schedule follow-up appointment in 2 months - Perform lab tests for kidney function and electrolytes        Return in about 2 months (around 07/17/2023) for weight f/u.       Aden Agreste, MD  [facility] [phone] (phone) [phone] (fax)  [facility]

## 2023-05-17 NOTE — Assessment & Plan Note (Signed)
 Cholesterol levels were last checked in October and were within normal limits. Current dietary focus is on weight gain, so cholesterol levels are not being closely monitored.

## 2023-05-17 NOTE — Assessment & Plan Note (Signed)
 Chronic condition. Discussed the importance of maintaining weight to provide additional padding and reduce fracture risk.

## 2023-05-18 ENCOUNTER — Encounter: Payer: Self-pay | Admitting: Family Medicine

## 2023-05-18 DIAGNOSIS — F411 Generalized anxiety disorder: Secondary | ICD-10-CM | POA: Diagnosis not present

## 2023-05-18 DIAGNOSIS — I10 Essential (primary) hypertension: Secondary | ICD-10-CM | POA: Diagnosis not present

## 2023-05-18 DIAGNOSIS — E440 Moderate protein-calorie malnutrition: Secondary | ICD-10-CM | POA: Diagnosis not present

## 2023-05-18 DIAGNOSIS — M810 Age-related osteoporosis without current pathological fracture: Secondary | ICD-10-CM | POA: Diagnosis not present

## 2023-05-18 DIAGNOSIS — E785 Hyperlipidemia, unspecified: Secondary | ICD-10-CM | POA: Diagnosis not present

## 2023-05-18 DIAGNOSIS — R278 Other lack of coordination: Secondary | ICD-10-CM | POA: Diagnosis not present

## 2023-05-18 LAB — BASIC METABOLIC PANEL WITH GFR
BUN/Creatinine Ratio: 16 (ref 12–28)
BUN: 8 mg/dL (ref 8–27)
CO2: 26 mmol/L (ref 20–29)
Calcium: 9 mg/dL (ref 8.7–10.3)
Chloride: 102 mmol/L (ref 96–106)
Creatinine, Ser: 0.51 mg/dL — ABNORMAL LOW (ref 0.57–1.00)
Glucose: 73 mg/dL (ref 70–99)
Potassium: 3.8 mmol/L (ref 3.5–5.2)
Sodium: 140 mmol/L (ref 134–144)
eGFR: 95 mL/min/{1.73_m2} (ref >59)

## 2023-05-30 ENCOUNTER — Telehealth: Payer: Self-pay

## 2023-05-30 ENCOUNTER — Telehealth: Payer: Self-pay | Admitting: Family Medicine

## 2023-05-30 NOTE — Addendum Note (Signed)
 Addended by: Darrow End on: 05/30/2023 04:51 PM   Modules accepted: Orders

## 2023-05-30 NOTE — Telephone Encounter (Signed)
 OK for verbals

## 2023-05-30 NOTE — Telephone Encounter (Signed)
 No form received. Ok to send refill since the pharmacy is saying they don't have it.

## 2023-05-30 NOTE — Telephone Encounter (Signed)
 Copied from CRM [id number]. Topic: Clinical - Home Health Verbal Orders >> May 30, 2023  2:11 PM Zipporah Him wrote: Caller/Agency:Laura Patteson/[facility] Callback Number: [phone], okay to leave a secure message. Service Requested: Physical Therapy Frequency: 2w1, discharge this week. Any new concerns about the patient? Yes, patient would like to be referred to continue outpatient [facility] [place]. Fax was sent over on the third but was not received back. Can take verbal orders.

## 2023-05-30 NOTE — Telephone Encounter (Signed)
 LRF 11/29/2022 #90 3rf Requesting refill too soon

## 2023-05-30 NOTE — Telephone Encounter (Signed)
 [facility] pharmacy is requesting refill lisinopril (ZESTRIL) 40 MG tablet   Please advise

## 2023-05-30 NOTE — Telephone Encounter (Signed)
 Copied from CRM [id number]. Topic: General - Other >> May 30, 2023  1:44 PM Marissa P wrote: Reason for CRM: Patient called to see if there was a fax received please advise

## 2023-05-31 ENCOUNTER — Other Ambulatory Visit: Payer: Self-pay

## 2023-05-31 DIAGNOSIS — E785 Hyperlipidemia, unspecified: Secondary | ICD-10-CM | POA: Diagnosis not present

## 2023-05-31 DIAGNOSIS — R278 Other lack of coordination: Secondary | ICD-10-CM | POA: Diagnosis not present

## 2023-05-31 DIAGNOSIS — I10 Essential (primary) hypertension: Secondary | ICD-10-CM | POA: Diagnosis not present

## 2023-05-31 DIAGNOSIS — E440 Moderate protein-calorie malnutrition: Secondary | ICD-10-CM | POA: Diagnosis not present

## 2023-05-31 DIAGNOSIS — M810 Age-related osteoporosis without current pathological fracture: Secondary | ICD-10-CM | POA: Diagnosis not present

## 2023-05-31 DIAGNOSIS — F411 Generalized anxiety disorder: Secondary | ICD-10-CM | POA: Diagnosis not present

## 2023-05-31 MED ORDER — LISINOPRIL 40 MG PO TABS: ORAL_TABLET | ORAL | 1 refills | Status: AC

## 2023-05-31 NOTE — Telephone Encounter (Signed)
Verbals given on secure line

## 2023-05-31 NOTE — Telephone Encounter (Signed)
 Rx sent and verbals given per other telephone encounter

## 2023-06-01 ENCOUNTER — Other Ambulatory Visit: Payer: Self-pay | Admitting: Family Medicine

## 2023-06-01 NOTE — Telephone Encounter (Signed)
 Copied from CRM [id number]. Topic: Clinical - Medication Refill >> Jun 01, 2023  8:49 AM Lotus Round B wrote: Medication: ALPRAZolam  (XANAX ) 0.5 MG tablet    Has the patient contacted their pharmacy? No (Agent: If no, request that the patient contact the pharmacy for the refill. If patient does not wish to contact the pharmacy document the reason why and proceed with request.) (Agent: If yes, when and what did the pharmacy advise?)  This is the patient's preferred pharmacy:  [id number] - [place], [place] - [address] AT [facility] OF [address] [facility] [address] [place] [address] Phone: [phone] Fax: [phone]  Is this the correct pharmacy for this prescription? Yes If no, delete pharmacy and type the correct one.   Has the prescription been filled recently? No  Is the patient out of the medication? Yes  Has the patient been seen for an appointment in the last year OR does the patient have an upcoming appointment? Yes  Can we respond through MyChart? No  Agent: Please be advised that Rx refills may take up to 3 business days. We ask that you follow-up with your pharmacy.

## 2023-06-02 ENCOUNTER — Ambulatory Visit: Payer: Self-pay

## 2023-06-02 ENCOUNTER — Other Ambulatory Visit: Payer: Self-pay | Admitting: Family Medicine

## 2023-06-02 DIAGNOSIS — M810 Age-related osteoporosis without current pathological fracture: Secondary | ICD-10-CM | POA: Diagnosis not present

## 2023-06-02 DIAGNOSIS — R278 Other lack of coordination: Secondary | ICD-10-CM | POA: Diagnosis not present

## 2023-06-02 DIAGNOSIS — I10 Essential (primary) hypertension: Secondary | ICD-10-CM | POA: Diagnosis not present

## 2023-06-02 DIAGNOSIS — F411 Generalized anxiety disorder: Secondary | ICD-10-CM | POA: Diagnosis not present

## 2023-06-02 DIAGNOSIS — E785 Hyperlipidemia, unspecified: Secondary | ICD-10-CM | POA: Diagnosis not present

## 2023-06-02 DIAGNOSIS — E440 Moderate protein-calorie malnutrition: Secondary | ICD-10-CM | POA: Diagnosis not present

## 2023-06-02 NOTE — Patient Outreach (Signed)
 Complex Care Management   Visit Note  06/02/2023  Name:  Amanda Tucker MRN: [id number] DOB: 01/25/1946  Situation: Referral received for Complex Care Management related to HTN, protein malnutrition I obtained verbal consent from Patient.  Visit completed with patient  on the phone Unable to complete assessment with patient due to patient having appointment with home health PT. Patient agreeable to rescheduling appointment.   Background:   Past Medical History:  Diagnosis Date   Anxiety    Arthritis    Balance problem    Depression    Diverticulitis    Environmental and seasonal allergies    GERD (gastroesophageal reflux disease)    HOH (hard of hearing)    Hyperlipidemia    Hypertension    IBS (irritable bowel syndrome)    Osteoporosis    Right sided sciatica    Vertigo     Assessment: Patient Reported Symptoms:  Cognitive Cognitive Status: Alert and oriented to person, place, and time, Insightful and able to interpret abstract concepts, Normal speech and language skills Cognitive/Intellectual Conditions Management [RPT]: None reported or documented in medical history or problem list   Health Maintenance Behaviors: Annual physical exam Healing Pattern: Average  Neurological      HEENT        Cardiovascular      Respiratory      Endocrine      Gastrointestinal        Genitourinary      Integumentary      Musculoskeletal          Psychosocial              05/17/2023   11:13 AM  Depression screen PHQ 2/9  Decreased Interest 0  Down, Depressed, Hopeless 0  PHQ - 2 Score 0  Altered sleeping 1  Tired, decreased energy 1  Change in appetite 1  Feeling bad or failure about yourself  0  Trouble concentrating 0  Moving slowly or fidgety/restless 0  Suicidal thoughts 0  PHQ-9 Score 3  Difficult doing work/chores Not difficult at all    There were no vitals filed for this visit.  Medications Reviewed Today     Reviewed by Aysen Shieh E, RN  (Registered Nurse) on 06/02/23 at 1021  Med List Status: <None>   Medication Order Taking? Sig Documenting Provider Last Dose Status Informant  alendronate  (FOSAMAX ) 70 MG tablet [id number] No TAKE 1 TABLET(70 MG) BY MOUTH 1 TIME A WEEK WITH A FULL GLASS OF WATER AND ON AN EMPTY STOMACH David Escort, Stan Eans, MD Taking Active   ALPRAZolam  (XANAX ) 0.5 MG tablet [id number] No TAKE 1/2 TABLET(0.25 MG) BY MOUTH TWICE DAILY AS NEEDED FOR ANXIETY David Escort Angela M, MD Taking Active   aspirin EC 81 MG tablet [id number] No Take 81 mg daily by mouth. [provider] Taking Active Family Member  Aspirin-Acetaminophen-Caffeine (EXCEDRIN PO) [id number] No Take 2 tablets by mouth daily as needed. [provider] Taking Active Self  diclofenac Sodium (VOLTAREN ARTHRITIS PAIN) 1 % GEL [id number] No Apply 2 g topically 4 (four) times daily as needed. [provider] Taking Active Self  lisinopril  (ZESTRIL ) 40 MG tablet [id number]  TAKE 1 TABLET(40 MG) BY MOUTH DAILYTAKE 1 TABLET(40 MG) BY MOUTH DAILY Bacigalupo, Stan Eans, MD  Active   mirtazapine  (REMERON ) 7.5 MG tablet [id number]  Take 1 tablet (7.5 mg total) by mouth at bedtime. Bacigalupo, Angela M, MD  Active   Multiple Vitamin (MULTIVITAMIN ADULT PO) [id number] No Take by mouth daily.  [provider] Taking Active            Med Note Abigail Abler, MCKENZIE A   Tue Apr 01, 2020  9:51 AM) occasionally  omeprazole (PRILOSEC) 20 MG capsule [id number] No Take 20 mg by mouth daily. Unsure of dose [provider] Taking Active            Med Note Marrie Sizer, Daveigh Batty E   Wed Apr 27, 2023  9:59 AM) Patient states she takes as needed.  OVER THE COUNTER MEDICATION [id number] No [employer] Apply prn [provider] Taking Active Self  sertraline  (ZOLOFT ) 50 MG tablet [id number] No Take 1 tablet (50 mg total) by mouth daily. Mazie Speed, MD Taking Active   simvastatin  (ZOCOR ) 40 MG tablet [id number] No TAKE  1 TABLET(40 MG) BY MOUTH DAILY AT 6 PM Bacigalupo, Angela M, MD Taking Active   VITAMIN D  PO [id number] No Take by mouth daily. [provider] Taking Active             Recommendation:   PCP Follow-up  Follow Up Plan:   Telephone follow-up in 1 month with RN case manager  Verba Girt RN, BSN, CCM [place]  [facility], Population Health Case Manager Phone: [phone]

## 2023-06-02 NOTE — Telephone Encounter (Signed)
 Copied from CRM [id number]. Topic: Clinical - Medication Refill >> Jun 01, 2023  8:49 AM Lotus Round B wrote: Medication: ALPRAZolam  (XANAX ) 0.5 MG tablet    Has the patient contacted their pharmacy? No (Agent: If no, request that the patient contact the pharmacy for the refill. If patient does not wish to contact the pharmacy document the reason why and proceed with request.) (Agent: If yes, when and what did the pharmacy advise?)  This is the patient's preferred pharmacy:  [id number] - [place], [place] - [address] AT [facility] [address] [facility] [address] [place] [address] Phone: [phone] Fax: [phone]  Is this the correct pharmacy for this prescription? Yes If no, delete pharmacy and type the correct one.   Has the prescription been filled recently? No  Is the patient out of the medication? Yes  Has the patient been seen for an appointment in the last year OR does the patient have an upcoming appointment? Yes  Can we respond through MyChart? No  Agent: Please be advised that Rx refills may take up to 3 business days. We ask that you follow-up with your pharmacy. >> Jun 02, 2023  8:22 AM Rosamond Comes wrote: Patient calling asking for update on medication refill. Advised  patient to call pharmacy Friday mid morning to see if it has been sent in, before calling us  again.

## 2023-06-03 NOTE — Telephone Encounter (Signed)
 Requested medication (s) are due for refill today - no  Requested medication (s) are on the active medication list -yes  Future visit scheduled -no  Last refill: 05/02/23  Notes to clinic: duplicate request- nondelegated Rx- already filled by provider  Requested Prescriptions  Pending Prescriptions Disp Refills   ALPRAZolam  (XANAX ) 0.5 MG tablet 30 tablet 5    Sig: TAKE 1/2 TABLET(0.25 MG) BY MOUTH TWICE DAILY AS NEEDED FOR ANXIETY     Not Delegated - Psychiatry: Anxiolytics/Hypnotics 2 Failed - 06/03/2023  8:52 AM      Failed - This refill cannot be delegated      Failed - Urine Drug Screen completed in last 360 days      Passed - Patient is not pregnant      Passed - Valid encounter within last 6 months    Recent Outpatient Visits           2 weeks ago Primary hypertension   [place] [facility] [place], Stan Eans, MD   2 months ago Imbalance   [place] [facility] Mazie Speed, MD                 Requested Prescriptions  Pending Prescriptions Disp Refills   ALPRAZolam  (XANAX ) 0.5 MG tablet 30 tablet 5    Sig: TAKE 1/2 TABLET(0.25 MG) BY MOUTH TWICE DAILY AS NEEDED FOR ANXIETY     Not Delegated - Psychiatry: Anxiolytics/Hypnotics 2 Failed - 06/03/2023  8:52 AM      Failed - This refill cannot be delegated      Failed - Urine Drug Screen completed in last 360 days      Passed - Patient is not pregnant      Passed - Valid encounter within last 6 months    Recent Outpatient Visits           2 weeks ago Primary hypertension   [place] [facility] [place], Stan Eans, MD   2 months ago Imbalance   [place] [facility] [place], Stan Eans, MD

## 2023-06-03 NOTE — Telephone Encounter (Signed)
 Requested medication (s) are due for refill today - no  Requested medication (s) are on the active medication list -yes  Future visit scheduled -no  Last refill: 05/02/23 #30 5RF  Notes to clinic: non delegated Rx  Requested Prescriptions  Pending Prescriptions Disp Refills   ALPRAZolam  (XANAX ) 0.5 MG tablet 30 tablet 5    Sig: TAKE 1/2 TABLET(0.25 MG) BY MOUTH TWICE DAILY AS NEEDED FOR ANXIETY     Not Delegated - Psychiatry: Anxiolytics/Hypnotics 2 Failed - 06/03/2023  8:49 AM      Failed - This refill cannot be delegated      Failed - Urine Drug Screen completed in last 360 days      Passed - Patient is not pregnant      Passed - Valid encounter within last 6 months    Recent Outpatient Visits           2 weeks ago Primary hypertension   [place] [facility] [place], Stan Eans, MD   2 months ago Imbalance   [place] [facility] Mazie Speed, MD                 Requested Prescriptions  Pending Prescriptions Disp Refills   ALPRAZolam  (XANAX ) 0.5 MG tablet 30 tablet 5    Sig: TAKE 1/2 TABLET(0.25 MG) BY MOUTH TWICE DAILY AS NEEDED FOR ANXIETY     Not Delegated - Psychiatry: Anxiolytics/Hypnotics 2 Failed - 06/03/2023  8:49 AM      Failed - This refill cannot be delegated      Failed - Urine Drug Screen completed in last 360 days      Passed - Patient is not pregnant      Passed - Valid encounter within last 6 months    Recent Outpatient Visits           2 weeks ago Primary hypertension   [place] [facility] [place], Stan Eans, MD   2 months ago Imbalance   [place] [facility] [place], Stan Eans, MD

## 2023-06-06 ENCOUNTER — Telehealth: Payer: Self-pay

## 2023-06-06 NOTE — Telephone Encounter (Signed)
 Pt advised to contact pharmacy for refill Verbalized understanding

## 2023-06-06 NOTE — Telephone Encounter (Signed)
 Multiple requests for refill on Alprazolam .  Last filled 05/02/23 30 x 5. Pharmacy has been advised that request is too soon.  LMTCB to inform patient request is too soon.     Copied from CRM [id number]. Topic: Clinical - Medication Refill >> Jun 01, 2023  8:49 AM Lotus Round B wrote: Medication: ALPRAZolam  (XANAX ) 0.5 MG tablet    Has the patient contacted their pharmacy? No (Agent: If no, request that the patient contact the pharmacy for the refill. If patient does not wish to contact the pharmacy document the reason why and proceed with request.) (Agent: If yes, when and what did the pharmacy advise?)  This is the patient's preferred pharmacy:  [id number] - [place], [place] - [address] AT [facility] [address] [facility] [address] [place] [address] Phone: [phone] Fax: [phone]  Is this the correct pharmacy for this prescription? Yes If no, delete pharmacy and type the correct one.   Has the prescription been filled recently? No  Is the patient out of the medication? Yes  Has the patient been seen for an appointment in the last year OR does the patient have an upcoming appointment? Yes  Can we respond through MyChart? No  Agent: Please be advised that Rx refills may take up to 3 business days. We ask that you follow-up with your pharmacy. >> Jun 06, 2023 10:08 AM Zipporah Him wrote: Patient states she still has not gotten a refill on this medication. She only has one left. There is a note stating she requested a refill too soon. Was last filled 4/7. Please call patient ASAP to provide clarification on the issue.  >> Jun 02, 2023  8:22 AM Rosamond Comes wrote: Patient calling asking for update on medication refill. Advised  patient to call pharmacy Friday mid morning to see if it has been sent in, before calling us  again.

## 2023-06-07 ENCOUNTER — Telehealth: Payer: Self-pay

## 2023-06-07 DIAGNOSIS — R2689 Other abnormalities of gait and mobility: Secondary | ICD-10-CM

## 2023-06-07 DIAGNOSIS — M810 Age-related osteoporosis without current pathological fracture: Secondary | ICD-10-CM

## 2023-06-07 DIAGNOSIS — M7121 Synovial cyst of popliteal space [Baker], right knee: Secondary | ICD-10-CM

## 2023-06-07 NOTE — Telephone Encounter (Signed)
 Copied from CRM [id number]. Topic: Referral - Request for Referral >> Jun 07, 2023  4:34 PM Felizardo Hotter wrote: Did the patient discuss referral with their provider in the last year? Yes (If No - schedule appointment) (If Yes - send message)  Appointment offered? Yes  Type of order/referral and detailed reason for visit: [address] [place], [place] [address] Phone: [phone] Fax: [phone]  Preference of office, provider, location: [facility], [place], [place]  If referral order, have you been seen by this specialty before? No (If Yes, this issue or another issue? When? Where?  Can we respond through MyChart? No

## 2023-06-15 ENCOUNTER — Other Ambulatory Visit: Payer: Self-pay

## 2023-06-15 NOTE — Patient Instructions (Addendum)
 Visit Information  Thank you for taking time to visit with me today. Please don't hesitate to contact me if I can be of assistance to you before our next scheduled appointment.  Your next care management appointment is by telephone on 07/19/23 at 2 pm  Telephone follow-up in 1 month with Michele Ahle, RN case manager  Please call the care guide team at [phone] if you need to cancel, schedule, or reschedule an appointment.   Please call the Suicide and Crisis Lifeline: 988 call 1-800-273-TALK (toll free, 24 hour hotline) if you are experiencing a Mental Health or Behavioral Health Crisis or need someone to talk to.  Verba Girt RN, BSN, CCM [place]  [facility], Population Health Case Manager Phone: [phone]  Managing Your Hypertension Hypertension, also called high blood pressure, is when the force of the blood pressing against the walls of the arteries is too strong. Arteries are blood vessels that carry blood from your heart throughout your body. Hypertension forces the heart to work harder to pump blood and may cause the arteries to become narrow or stiff. Understanding blood pressure readings A blood pressure reading includes a higher number over a lower number: The first, or top, number is called the systolic pressure. It is a measure of the pressure in your arteries as your heart beats. The second, or bottom number, is called the diastolic pressure. It is a measure of the pressure in your arteries as the heart relaxes. For most people, a normal blood pressure is below 120/80. Your personal target blood pressure may vary depending on your medical conditions, your age, and other factors. Blood pressure is classified into four stages. Based on your blood pressure reading, your health care provider may use the following stages to determine what type of treatment you need, if any. Systolic pressure and diastolic pressure are measured in a unit called millimeters  of mercury (mmHg). Normal Systolic pressure: below 120. Diastolic pressure: below 80. Elevated Systolic pressure: 120-129. Diastolic pressure: below 80. Hypertension stage 1 Systolic pressure: 130-139. Diastolic pressure: 80-89. Hypertension stage 2 Systolic pressure: 140 or above. Diastolic pressure: 90 or above. How can this condition affect me? Managing your hypertension is very important. Over time, hypertension can damage the arteries and decrease blood flow to parts of the body, including the brain, heart, and kidneys. Having untreated or uncontrolled hypertension can lead to: A heart attack. A stroke. A weakened blood vessel (aneurysm). Heart failure. Kidney damage. Eye damage. Memory and concentration problems. Vascular dementia. What actions can I take to manage this condition? Hypertension can be managed by making lifestyle changes and possibly by taking medicines. Your health care provider will help you make a plan to bring your blood pressure within a normal range. You may be referred for counseling on a healthy diet and physical activity. Nutrition  Eat a diet that is high in fiber and potassium, and low in salt (sodium), added sugar, and fat. An example eating plan is called the DASH diet. DASH stands for Dietary Approaches to Stop Hypertension. To eat this way: Eat plenty of fresh fruits and vegetables. Try to fill one-half of your plate at each meal with fruits and vegetables. Eat whole grains, such as whole-wheat pasta, brown rice, or whole-grain bread. Fill about one-fourth of your plate with whole grains. Eat low-fat dairy products. Avoid fatty cuts of meat, processed or cured meats, and poultry with skin. Fill about one-fourth of your plate with lean proteins such as fish, chicken without  skin, beans, eggs, and tofu. Avoid pre-made and processed foods. These tend to be higher in sodium, added sugar, and fat. Reduce your daily sodium intake. Many people with  hypertension should eat less than 1,500 mg of sodium a day. Lifestyle  Work with your health care provider to maintain a healthy body weight or to lose weight. Ask what an ideal weight is for you. Get at least 30 minutes of exercise that causes your heart to beat faster (aerobic exercise) most days of the week. Activities may include walking, swimming, or biking. Include exercise to strengthen your muscles (resistance exercise), such as weight lifting, as part of your weekly exercise routine. Try to do these types of exercises for 30 minutes at least 3 days a week. Do not use any products that contain nicotine  or tobacco. These products include cigarettes, chewing tobacco, and vaping devices, such as e-cigarettes. If you need help quitting, ask your health care provider. Control any long-term (chronic) conditions you have, such as high cholesterol or diabetes. Identify your sources of stress and find ways to manage stress. This may include meditation, deep breathing, or making time for fun activities. Alcohol use Do not drink alcohol if: Your health care provider tells you not to drink. You are pregnant, may be pregnant, or are planning to become pregnant. If you drink alcohol: Limit how much you have to: 0-1 drink a day for women. 0-2 drinks a day for men. Know how much alcohol is in your drink. In the [place] one drink equals one 12 oz bottle of beer (355 mL), one 5 oz glass of wine (148 mL), or one 1 oz glass of hard liquor (44 mL). Medicines Your health care provider may prescribe medicine if lifestyle changes are not enough to get your blood pressure under control and if: Your systolic blood pressure is 130 or higher. Your diastolic blood pressure is 80 or higher. Take medicines only as told by your health care provider. Follow the directions carefully. Blood pressure medicines must be taken as told by your health care provider. The medicine does not work as well when you skip doses.  Skipping doses also puts you at risk for problems. Monitoring Before you monitor your blood pressure: Do not smoke, drink caffeinated beverages, or exercise within 30 minutes before taking a measurement. Use the bathroom and empty your bladder (urinate). Sit quietly for at least 5 minutes before taking measurements. Monitor your blood pressure at home as told by your health care provider. To do this: Sit with your back straight and supported. Place your feet flat on the floor. Do not cross your legs. Support your arm on a flat surface, such as a table. Make sure your upper arm is at heart level. Each time you measure, take two or three readings one minute apart and record the results. You may also need to have your blood pressure checked regularly by your health care provider. General information Talk with your health care provider about your diet, exercise habits, and other lifestyle factors that may be contributing to hypertension. Review all the medicines you take with your health care provider because there may be side effects or interactions. Keep all follow-up visits. Your health care provider can help you create and adjust your plan for managing your high blood pressure. Where to find more information National Heart, Lung, and Blood Institute: PopSteam.is American Heart Association: www.heart.org Contact a health care provider if: You think you are having a reaction to medicines you have taken.  You have repeated (recurrent) headaches. You feel dizzy. You have swelling in your ankles. You have trouble with your vision. Get help right away if: You develop a severe headache or confusion. You have unusual weakness or numbness, or you feel faint. You have severe pain in your chest or abdomen. You vomit repeatedly. You have trouble breathing. These symptoms may be an emergency. Get help right away. Call 911. Do not wait to see if the symptoms will go away. Do not drive  yourself to the hospital. Summary Hypertension is when the force of blood pumping through your arteries is too strong. If this condition is not controlled, it may put you at risk for serious complications. Your personal target blood pressure may vary depending on your medical conditions, your age, and other factors. For most people, a normal blood pressure is less than 120/80. Hypertension is managed by lifestyle changes, medicines, or both. Lifestyle changes to help manage hypertension include losing weight, eating a healthy, low-sodium diet, exercising more, stopping smoking, and limiting alcohol. This information is not intended to replace advice given to you by your health care provider. Make sure you discuss any questions you have with your health care provider. Document Revised: 09/25/2020 Document Reviewed: 09/25/2020 Elsevier Patient Education  2024 [employer].  Fall Prevention in the Home, Adult Falls can cause injuries and can happen to people of all ages. There are many things you can do to make your home safer and to help prevent falls. What actions can I take to prevent falls? General information Use good lighting in all rooms. Make sure to: Replace any light bulbs that burn out. Turn on the lights in dark areas and use night-lights. Keep items that you use often in easy-to-reach places. Lower the shelves around your home if needed. Move furniture so that there are clear paths around it. Do not use throw rugs or other things on the floor that can make you trip. If any of your floors are uneven, fix them. Add color or contrast paint or tape to clearly mark and help you see: Grab bars or handrails. First and last steps of staircases. Where the edge of each step is. If you use a ladder or stepladder: Make sure that it is fully opened. Do not climb a closed ladder. Make sure the sides of the ladder are locked in place. Have someone hold the ladder while you use it. Know where  your pets are as you move through your home. What can I do in the bathroom?     Keep the floor dry. Clean up any water on the floor right away. Remove soap buildup in the bathtub or shower. Buildup makes bathtubs and showers slippery. Use non-skid mats or decals on the floor of the bathtub or shower. Attach bath mats securely with double-sided, non-slip rug tape. If you need to sit down in the shower, use a non-slip stool. Install grab bars by the toilet and in the bathtub and shower. Do not use towel bars as grab bars. What can I do in the bedroom? Make sure that you have a light by your bed that is easy to reach. Do not use any sheets or blankets on your bed that hang to the floor. Have a firm chair or bench with side arms that you can use for support when you get dressed. What can I do in the kitchen? Clean up any spills right away. If you need to reach something above you, use a step stool  with a grab bar. Keep electrical cords out of the way. Do not use floor polish or wax that makes floors slippery. What can I do with my stairs? Do not leave anything on the stairs. Make sure that you have a light switch at the top and the bottom of the stairs. Make sure that there are handrails on both sides of the stairs. Fix handrails that are broken or loose. Install non-slip stair treads on all your stairs if they do not have carpet. Avoid having throw rugs at the top or bottom of the stairs. Choose a carpet that does not hide the edge of the steps on the stairs. Make sure that the carpet is firmly attached to the stairs. Fix carpet that is loose or worn. What can I do on the outside of my home? Use bright outdoor lighting. Fix the edges of walkways and driveways and fix any cracks. Clear paths of anything that can make you trip, such as tools or rocks. Add color or contrast paint or tape to clearly mark and help you see anything that might make you trip as you walk through a door, such as a  raised step or threshold. Trim any bushes or trees on paths to your home. Check to see if handrails are loose or broken and that both sides of all steps have handrails. Install guardrails along the edges of any raised decks and porches. Have leaves, snow, or ice cleared regularly. Use sand, salt, or ice melter on paths if you live where there is ice and snow during the winter. Clean up any spills in your garage right away. This includes grease or oil spills. What other actions can I take? Review your medicines with your doctor. Some medicines can cause dizziness or changes in blood pressure, which increase your risk of falling. Wear shoes that: Have a low heel. Do not wear high heels. Have rubber bottoms and are closed at the toe. Feel good on your feet and fit well. Use tools that help you move around if needed. These include: Canes. Walkers. Scooters. Crutches. Ask your doctor what else you can do to help prevent falls. This may include seeing a physical therapist to learn to do exercises to move better and get stronger. Where to find more information Centers for Disease Control and Prevention, STEADI: TonerPromos.no [employer] on Aging: BaseRingTones.pl National Institute on Aging: BaseRingTones.pl Contact a doctor if: You are afraid of falling at home. You feel weak, drowsy, or dizzy at home. You fall at home. Get help right away if you: Lose consciousness or have trouble moving after a fall. Have a fall that causes a head injury. These symptoms may be an emergency. Get help right away. Call 911. Do not wait to see if the symptoms will go away. Do not drive yourself to the hospital. This information is not intended to replace advice given to you by your health care provider. Make sure you discuss any questions you have with your health care provider. Document Revised: 09/14/2021 Document Reviewed: 09/14/2021 Elsevier Patient Education  2024 [employer].

## 2023-06-15 NOTE — Patient Outreach (Signed)
 Complex Care Management   Visit Note  06/15/2023  Name:  Amanda Tucker MRN: [id number] DOB: [birth year]  Situation: Referral received for Complex Care Management related to health conditions I obtained verbal consent from Patient.  Visit completed with patient  on the phone  Background:   Past Medical History:  Diagnosis Date   Anxiety    Arthritis    Balance problem    Depression    Diverticulitis    Environmental and seasonal allergies    GERD (gastroesophageal reflux disease)    HOH (hard of hearing)    Hyperlipidemia    Hypertension    IBS (irritable bowel syndrome)    Osteoporosis    Right sided sciatica    Vertigo     Assessment: Patient Reported Symptoms:  Cognitive Cognitive Status: Alert and oriented to person, place, and time, Insightful and able to interpret abstract concepts, Normal speech and language skills Cognitive/Intellectual Conditions Management [RPT]: None reported or documented in medical history or problem list   Health Maintenance Behaviors: Annual physical exam Healing Pattern: Average  Neurological Neurological Review of Symptoms: No symptoms reported    HEENT HEENT Symptoms Reported: Runny nose HEENT Management Strategies: Routine screening HEENT Comment: Patient states she has a runny nose during allergy season.  She states she is not taking medication for this.    Cardiovascular Cardiovascular Symptoms Reported: No symptoms reported Does patient have uncontrolled Hypertension?: No Cardiovascular Conditions: Hypertension Cardiovascular Management Strategies: Routine screening Weight: 78 lb (35.4 kg) Cardiovascular Comment: Patient states her daughter will be purchasing a pediatric blood pressure cuff for her so that she can monitor her blood pressure.  Respiratory Respiratory Symptoms Reported: No symptoms reported Additional Respiratory Details: Patient states she continues to smoke 5-8 cigarettes per day.  Declines offer for smoking  cessation. She states she has tried this before.    Endocrine Is patient diabetic?: No Endocrine Conditions: Osteoporosis Endocrine Management Strategies: Routine screening, Medication therapy  Gastrointestinal Gastrointestinal Symptoms Reported: No symptoms reported      Genitourinary Genitourinary Symptoms Reported: Incontinence Additional Genitourinary Details: patient states she occasionally has issues with incontinence if she is far away from the bathroom. Genitourinary Conditions: Incontinence Genitourinary Management Strategies: Incontinence garment/pad  Integumentary Integumentary Symptoms Reported: No symptoms reported Skin Comment: patient states she gets occasional red marks on arms if she accidently bumps into something.  She states this last for about 1 week and then resolves.  Musculoskeletal Musculoskelatal Symptoms Reviewed: Difficulty walking, Unsteady gait Musculoskeletal Conditions: Mobility limited, Osteoporosis Musculoskeletal Management Strategies: Routine screening Musculoskeletal Comment: Patient states she feels her balance has gotten better with having home health PT.  She states her PT has ended however she and her daughters feel she could benefit from ongoing outpatient therapy. Patient denies having any falls over the last 4-5 months. She continues to ambulate with her pediatric walker. Patient states she has right knee discomfort that she feels could be contributing to her limit in mobility. Reports knee pain starting a couple of months ago.      Psychosocial Psychosocial Symptoms Reported: Anxiety - if selected complete GAD Additional Psychological Details: Patient states she feels her current treatment plan is managing her anxiety at this time. Behavioral Health Conditions: Anxiety Behavioral Management Strategies: Medication therapy, Support system (routine follow up with provider) Major Change/Loss/Stressor/Fears (CP): Denies Techniques to Cope with  Loss/Stress/Change: Diversional activities, Medication Quality of Family Relationships: supportive Do you feel physically threatened by others?: No      06/15/2023    9:48  AM  Depression screen PHQ 2/9  Decreased Interest 0  Down, Depressed, Hopeless 0  PHQ - 2 Score 0      06/15/2023    9:48 AM 05/17/2023   11:13 AM 03/22/2023    1:13 PM 04/22/2022    9:49 AM  GAD 7 : Generalized Anxiety Score  Nervous, Anxious, on Edge 1 1 2 1   Control/stop worrying 1 1 3 1   Worry too much - different things 2 2 3 1   Trouble relaxing 1 0 3 0  Restless 0 0 0 0  Easily annoyed or irritable 0 0 0 0  Afraid - awful might happen 1 1 3 3   Total GAD 7 Score 6 5 14 6   Anxiety Difficulty Not difficult at all Not difficult at all Somewhat difficult Not difficult at all      There were no vitals filed for this visit.  Medications Reviewed Today     Reviewed by Deborra Phegley E, RN (Registered Nurse) on 06/15/23 at [phone]  Med List Status: <None>   Medication Order Taking? Sig Documenting Provider Last Dose Status Informant  alendronate  (FOSAMAX ) 70 MG tablet [id number] Yes TAKE 1 TABLET(70 MG) BY MOUTH 1 TIME A WEEK WITH A FULL GLASS OF WATER AND ON AN EMPTY STOMACH David Escort, Stan Eans, MD Taking Active   ALPRAZolam  (XANAX ) 0.5 MG tablet [id number] Yes TAKE 1/2 TABLET(0.25 MG) BY MOUTH TWICE DAILY AS NEEDED FOR ANXIETY Bacigalupo, Angela M, MD Taking Active   aspirin EC 81 MG tablet [id number] Yes Take 81 mg daily by mouth. [provider] Taking Active Family Member  Aspirin-Acetaminophen-Caffeine (EXCEDRIN PO) [id number] Yes Take 2 tablets by mouth daily as needed. [provider] Taking Active Self  diclofenac Sodium (VOLTAREN ARTHRITIS PAIN) 1 % GEL [id number] Yes Apply 2 g topically 4 (four) times daily as needed. [provider] Taking Active Self  lisinopril  (ZESTRIL ) 40 MG tablet [id number] Yes TAKE 1 TABLET(40 MG) BY MOUTH DAILYTAKE 1 TABLET(40 MG) BY MOUTH DAILY Bacigalupo,  Stan Eans, MD Taking Active   mirtazapine  (REMERON ) 7.5 MG tablet [id number] Yes Take 1 tablet (7.5 mg total) by mouth at bedtime. Mazie Speed, MD Taking Active   Multiple Vitamin (MULTIVITAMIN ADULT PO) [id number] Yes Take by mouth daily. [provider] Taking Active            Med Note Abigail Abler, MCKENZIE A   Tue Apr 01, 2020  9:51 AM) occasionally  omeprazole (PRILOSEC) 20 MG capsule [id number] Yes Take 20 mg by mouth daily. Unsure of dose [provider] Taking Active            Med Note Marrie Sizer, Taevion Sikora E   Wed Apr 27, 2023  9:59 AM) Patient states she takes as needed.  OVER THE COUNTER MEDICATION [id number]  [facility] [place] Apply prn [provider]  Active Self  sertraline  (ZOLOFT ) 50 MG tablet [id number] Yes Take 1 tablet (50 mg total) by mouth daily. Mazie Speed, MD Taking Active   simvastatin  (ZOCOR ) 40 MG tablet [id number] Yes TAKE 1 TABLET(40 MG) BY MOUTH DAILY AT 6 PM Bacigalupo, Angela M, MD Taking Active   VITAMIN D  PO [id number] Yes Take by mouth daily. [provider] Taking Active             Recommendation:   PCP Follow-up  Follow Up Plan:   Telephone follow-up in 1 month.  Patient informed she will be transferred to Michele Ahle, RN case manager for ongoing follow  up.  Patient agreeable to transfer.   Verba Girt RN, BSN, CCM [employer], Population Health Case Manager Phone: [phone]

## 2023-07-18 ENCOUNTER — Ambulatory Visit: Admitting: Family Medicine

## 2023-07-19 ENCOUNTER — Encounter: Payer: Self-pay | Admitting: *Deleted

## 2023-07-19 ENCOUNTER — Other Ambulatory Visit: Payer: Self-pay | Admitting: *Deleted

## 2023-07-19 DIAGNOSIS — M25561 Pain in right knee: Secondary | ICD-10-CM | POA: Diagnosis not present

## 2023-07-19 DIAGNOSIS — R2681 Unsteadiness on feet: Secondary | ICD-10-CM | POA: Diagnosis not present

## 2023-07-19 DIAGNOSIS — R2689 Other abnormalities of gait and mobility: Secondary | ICD-10-CM | POA: Diagnosis not present

## 2023-07-21 DIAGNOSIS — R2681 Unsteadiness on feet: Secondary | ICD-10-CM | POA: Diagnosis not present

## 2023-07-21 DIAGNOSIS — M25561 Pain in right knee: Secondary | ICD-10-CM | POA: Diagnosis not present

## 2023-07-21 DIAGNOSIS — R2689 Other abnormalities of gait and mobility: Secondary | ICD-10-CM | POA: Diagnosis not present

## 2023-07-27 ENCOUNTER — Other Ambulatory Visit: Payer: Self-pay | Admitting: Family Medicine

## 2023-07-27 DIAGNOSIS — R2681 Unsteadiness on feet: Secondary | ICD-10-CM | POA: Diagnosis not present

## 2023-07-27 DIAGNOSIS — M25561 Pain in right knee: Secondary | ICD-10-CM | POA: Diagnosis not present

## 2023-07-27 DIAGNOSIS — R2689 Other abnormalities of gait and mobility: Secondary | ICD-10-CM | POA: Diagnosis not present

## 2023-07-31 ENCOUNTER — Other Ambulatory Visit: Payer: Self-pay | Admitting: Family Medicine

## 2023-08-02 DIAGNOSIS — R2681 Unsteadiness on feet: Secondary | ICD-10-CM | POA: Diagnosis not present

## 2023-08-02 DIAGNOSIS — R2689 Other abnormalities of gait and mobility: Secondary | ICD-10-CM | POA: Diagnosis not present

## 2023-08-02 DIAGNOSIS — M25561 Pain in right knee: Secondary | ICD-10-CM | POA: Diagnosis not present

## 2023-08-02 NOTE — Telephone Encounter (Signed)
 Requested Prescriptions  Pending Prescriptions Disp Refills   simvastatin  (ZOCOR ) 40 MG tablet [Pharmacy Med Name: SIMVASTATIN  40MG  TABLETS] 90 tablet 0    Sig: TAKE 1 TABLET(40 MG) BY MOUTH DAILY AT 6 PM     Cardiovascular:  Antilipid - Statins Failed - 08/02/2023  3:33 PM      Failed - Lipid Panel in normal range within the last 12 months    Cholesterol, Total  Date Value Ref Range Status  11/16/2022 167 100 - 199 mg/dL Final   LDL Cholesterol (Calc)  Date Value Ref Range Status  12/14/2016 97 mg/dL (calc) Final    Comment:    Reference range: <100 . Desirable range <100 mg/dL for primary prevention;   <70 mg/dL for patients with CHD or diabetic patients  with > or = 2 CHD risk factors. SABRA LDL-C is now calculated using the Martin-Hopkins  calculation, which is a validated novel method providing  better accuracy than the Friedewald equation in the  estimation of LDL-C.  Gladis APPLETHWAITE et al. SANDREA. 7986;689(80): 2061-2068  (http://education.QuestDiagnostics.com/faq/FAQ164)    LDL Chol Calc (NIH)  Date Value Ref Range Status  11/16/2022 78 0 - 99 mg/dL Final   HDL  Date Value Ref Range Status  11/16/2022 71 >39 mg/dL Final   Triglycerides  Date Value Ref Range Status  11/16/2022 98 0 - 149 mg/dL Final         Passed - Patient is not pregnant      Passed - Valid encounter within last 12 months    Recent Outpatient Visits           2 months ago Primary hypertension   [facility] [place], Jon HERO, MD   4 months ago Imbalance   [facility] [place], Jon HERO, MD

## 2023-08-04 DIAGNOSIS — R2689 Other abnormalities of gait and mobility: Secondary | ICD-10-CM | POA: Diagnosis not present

## 2023-08-04 DIAGNOSIS — M25561 Pain in right knee: Secondary | ICD-10-CM | POA: Diagnosis not present

## 2023-08-04 DIAGNOSIS — R2681 Unsteadiness on feet: Secondary | ICD-10-CM | POA: Diagnosis not present

## 2023-08-08 DIAGNOSIS — R2681 Unsteadiness on feet: Secondary | ICD-10-CM | POA: Diagnosis not present

## 2023-08-08 DIAGNOSIS — R2689 Other abnormalities of gait and mobility: Secondary | ICD-10-CM | POA: Diagnosis not present

## 2023-08-08 DIAGNOSIS — M25561 Pain in right knee: Secondary | ICD-10-CM | POA: Diagnosis not present

## 2023-08-11 DIAGNOSIS — R2689 Other abnormalities of gait and mobility: Secondary | ICD-10-CM | POA: Diagnosis not present

## 2023-08-11 DIAGNOSIS — R2681 Unsteadiness on feet: Secondary | ICD-10-CM | POA: Diagnosis not present

## 2023-08-11 DIAGNOSIS — M25561 Pain in right knee: Secondary | ICD-10-CM | POA: Diagnosis not present

## 2023-08-15 DIAGNOSIS — R2689 Other abnormalities of gait and mobility: Secondary | ICD-10-CM | POA: Diagnosis not present

## 2023-08-15 DIAGNOSIS — R2681 Unsteadiness on feet: Secondary | ICD-10-CM | POA: Diagnosis not present

## 2023-08-15 DIAGNOSIS — M25561 Pain in right knee: Secondary | ICD-10-CM | POA: Diagnosis not present

## 2023-08-18 ENCOUNTER — Telehealth: Payer: Self-pay

## 2023-08-18 DIAGNOSIS — R2681 Unsteadiness on feet: Secondary | ICD-10-CM | POA: Diagnosis not present

## 2023-08-18 DIAGNOSIS — M25561 Pain in right knee: Secondary | ICD-10-CM | POA: Diagnosis not present

## 2023-08-18 DIAGNOSIS — R2689 Other abnormalities of gait and mobility: Secondary | ICD-10-CM | POA: Diagnosis not present

## 2023-08-22 DIAGNOSIS — R2689 Other abnormalities of gait and mobility: Secondary | ICD-10-CM | POA: Diagnosis not present

## 2023-08-22 DIAGNOSIS — M25561 Pain in right knee: Secondary | ICD-10-CM | POA: Diagnosis not present

## 2023-08-22 DIAGNOSIS — R2681 Unsteadiness on feet: Secondary | ICD-10-CM | POA: Diagnosis not present

## 2023-08-24 ENCOUNTER — Other Ambulatory Visit: Payer: Self-pay

## 2023-08-24 NOTE — Patient Instructions (Signed)
 Visit Information  Thank you for taking time to visit with me today. Please don't hesitate to contact me if I can be of assistance to you before our next scheduled appointment.  Your next care management appointment is by telephone on 09/21/2023 at 2:30 pm  Telephone follow-up in 1 month  Please call the care guide team at [phone] if you need to cancel, schedule, or reschedule an appointment.   Please call the Suicide and Crisis Lifeline: 988 call the [place]  National Suicide Prevention Lifeline: 432-701-5447 or TTY: 630-539-5317 TTY (704) 625-7525) to talk to a trained counselor call 1-800-273-TALK (toll free, 24 hour hotline) go to [facility] [address] [place] [phone]) call 911 if you are experiencing a Mental Health or Behavioral Health Crisis or need someone to talk to.  Nestora Duos, MSN, RN [facility], [facility] RN Care Manager Direct Dial: [phone] Fax: [phone]  Warning Signs of a Stroke A stroke is a medical emergency. It should be treated right away. A stroke happens when there is not enough blood flow to the brain. A stroke can lead to brain damage and death. But if a person gets treated right away, they have a better chance of surviving and recovering. It is very important to recognize the symptoms of a stroke. What types of strokes are there? There are two main types of strokes: Ischemic stroke. This is the most common type. It happens when a blood vessel that sends blood to the brain is blocked. Hemorrhagic stroke. This happens when there is bleeding in the brain. This may be from a blood vessel leaking or bursting. A transient ischemic attack (TIA) causes the same symptoms as a stroke. But the symptoms go away quickly and do not cause lasting damage to the brain. TIAs still need to be treated right away. They are also a sign that you are at higher risk for a stroke. What  are the warning signs of a stroke? The symptoms of a stroke may differ based on the part of the brain that is involved. Symptoms often happen all of a sudden. BE FAST symptoms BE FAST is an easy way to remember the main warning signs of a stroke: B - Balance. Signs are dizziness, sudden trouble walking, or loss of balance. E - Eyes. Signs are trouble seeing or a sudden change in vision. F - Face. Signs are sudden weakness or numbness of the face, or the face or eyelid drooping on one side. A - Arms. Signs are weakness or numbness in an arm. This happens suddenly and usually on one side of the body. S - Speech. Signs are sudden trouble speaking, slurred speech, or trouble understanding what people say. T - Time. Time to call emergency services. Write down what time symptoms started. A stroke may be happening even if only one BE FAST symptom is present. Other signs of a stroke Some less common signs of a stroke include: A sudden, severe headache with no known cause. Nausea or vomiting. Seizure. These symptoms may be an emergency. Get help right away. Call 911. Do not wait to see if the symptoms will go away. Do not drive yourself to the hospital.  This information is not intended to replace advice given to you by your health care provider. Make sure you discuss any questions you have with your health care provider. Document Revised: 10/26/2021 Document Reviewed: 10/26/2021 Elsevier Patient Education  2024 Elsevier Inc.  High-Protein and High-Calorie Diet  Eating high-protein and high-calorie foods can help you to gain weight, heal after an injury, and get better after an illness or surgery. The amount of daily protein and calories you need depends on: Your body weight. The reason you were told to follow this diet. Usually, a high-protein, high-calorie diet means that you should: Eat 250-500 extra calories each day. Make sure that you get enough of your daily calories from  protein. Ask your health care provider how many of your calories should come from protein and how many calories total you need each day. Follow the diet as told by your provider. What are tips for following this plan? Reading food labels Check the nutrition facts label for calories, and grams of fat and protein. Items with more than 4 grams of protein are high-protein foods. General information  Ask your provider if you should take a nutritional supplement. Try to eat six small meals each day instead of three large meals. A goal is usually to eat every 2 to 3 hours. Eat a balanced diet. In each meal, include one food that's high in protein and one food with fat in it. Keep nutritious snacks available, such as nuts, trail mixes, dried fruit, and whole-milk yogurt. If you have kidney disease or diabetes, talk with your provider about how much protein is safe for you. Too much protein may put extra stress on your kidneys. Replace zero-calorie drinks with drinks that have calories in them, such as milk and 100% fruit juice. Consider setting a timer to remind you to eat. You'll want to eat even if you do not feel very hungry. Preparing meals Milk and dairy foods. Add whole milk, half-and-half, or heavy cream to cereal, pudding, soup, or hot cocoa. Add whole milk to instant breakfast drinks. Add powdered milk to baked goods, smoothies, or milkshakes. Add powdered milk, cream, or butter to mashed potatoes. Replace water with milk or heavy cream when making foods such as oatmeal, pudding, or cocoa. Make cream-based pastas and soups. Add cheese to cooked vegetables. Make whole-milk yogurt parfaits. Top them with granola, fruit, or nuts. Add cottage cheese to fruit. Add cream cheese to sandwiches or as a topping on crackers and bread. Eggs. Add hard-boiled eggs to salads. Keep hard-boiled eggs in the fridge to snack on. Add cheese to cooked eggs. Beans, nuts, and seeds. Add peanut butter to  oatmeal or smoothies. Use peanut butter as a dip for fruits and vegetables or as a topping for pretzels, celery, or crackers. Add beans to casseroles, dips, and spreads. Add pureed beans to sauces and soups. Salads, soups, and other foods. Add avocado, cheese, or both to sandwiches or salads. Add avocado to smoothies. Add meat, poultry, or seafood to rice, pasta, casseroles, salads, and soups. Use mayonnaise when making egg salad, chicken salad, or tuna salad. Add oil or butter to cooked vegetables and grains. What high-protein foods should I eat?  Vegetables Soybeans. Peas. Grains Quinoa. Bulgur wheat. Buckwheat. Meats and other proteins Beef, pork, and poultry. Fish and seafood. Eggs. Tofu. Textured vegetable protein (TVP). Peanut butter. Nuts and seeds. Dried beans. Protein powders. Hummus. Jerky. Dairy Whole milk. Whole-milk yogurt. Powdered milk. Cheese. Cottage cheese. Eggnog. Beverages High-protein supplement drinks. Soy milk. Other foods Protein bars. The items listed above may not be all the foods and drinks you can have. Talk with an expert in healthy eating called a dietitian to learn more. What high-calorie foods should I eat? Fruits Dried fruit. Fruit leather. Canned fruit in syrup.  Fruit juice. Avocado. Vegetables Vegetables cooked in oil or butter. Fried potatoes. Grains Pasta. Quick breads. Muffins. Pancakes. Granola. Meats and other proteins Peanut butter and other nut butters. Nuts and seeds. Dairy Heavy cream. Whipped cream. Cream cheese. Sour cream. Ice cream. Custard. Pudding. Whole-milk dairy products. Beverages Meal-replacement beverages. Nutrition shakes. Fruit juice. Seasonings and condiments Salad dressing. Mayonnaise. Alfredo sauce. Fruit preserves or jelly. Honey. Syrup. Sweets and desserts Cake. Cookies. Pie. Pastries. Candy bars. Chocolate. Fats and oils Butter or margarine. Oil. Gravy. Other foods Meal-replacement bars. The items listed  above may not be all the foods and drinks you can have. Talk with an expert in healthy eating to learn more. This information is not intended to replace advice given to you by your health care provider. Make sure you discuss any questions you have with your health care provider. Document Revised: 06/07/2022 Document Reviewed: 06/07/2022 Elsevier Patient Education  2024 Elsevier Inc.Hypertension, Adult Hypertension is another name for high blood pressure. High blood pressure forces your heart to work harder to pump blood. This can cause problems over time. There are two numbers in a blood pressure reading. There is a top number (systolic) over a bottom number (diastolic). It is best to have a blood pressure that is below 120/80. What are the causes? The cause of this condition is not known. Some other conditions can lead to high blood pressure. What increases the risk? Some lifestyle factors can make you more likely to develop high blood pressure: Smoking. Not getting enough exercise or physical activity. Being overweight. Having too much fat, sugar, calories, or salt (sodium) in your diet. Drinking too much alcohol. Other risk factors include: Having any of these conditions: Heart disease. Diabetes. High cholesterol. Kidney disease. Obstructive sleep apnea. Having a family history of high blood pressure and high cholesterol. Age. The risk increases with age. Stress. What are the signs or symptoms? High blood pressure may not cause symptoms. Very high blood pressure (hypertensive crisis) may cause: Headache. Fast or uneven heartbeats (palpitations). Shortness of breath. Nosebleed. Vomiting or feeling like you may vomit (nauseous). Changes in how you see. Very bad chest pain. Feeling dizzy. Seizures. How is this treated? This condition is treated by making healthy lifestyle changes, such as: Eating healthy foods. Exercising more. Drinking less alcohol. Your doctor may  prescribe medicine if lifestyle changes do not help enough and if: Your top number is above 130. Your bottom number is above 80. Your personal target blood pressure may vary. Follow these instructions at home: Eating and drinking  If told, follow the DASH eating plan. To follow this plan: Fill one half of your plate at each meal with fruits and vegetables. Fill one fourth of your plate at each meal with whole grains. Whole grains include whole-wheat pasta, brown rice, and whole-grain bread. Eat or drink low-fat dairy products, such as skim milk or low-fat yogurt. Fill one fourth of your plate at each meal with low-fat (lean) proteins. Low-fat proteins include fish, chicken without skin, eggs, beans, and tofu. Avoid fatty meat, cured and processed meat, or chicken with skin. Avoid pre-made or processed food. Limit the amount of salt in your diet to less than 1,500 mg each day. Do not drink alcohol if: Your doctor tells you not to drink. You are pregnant, may be pregnant, or are planning to become pregnant. If you drink alcohol: Limit how much you have to: 0-1 drink a day for women. 0-2 drinks a day for men. Know how much alcohol  is in your drink. In the [place] one drink equals one 12 oz bottle of beer (355 mL), one 5 oz glass of wine (148 mL), or one 1 oz glass of hard liquor (44 mL). Lifestyle  Work with your doctor to stay at a healthy weight or to lose weight. Ask your doctor what the best weight is for you. Get at least 30 minutes of exercise that causes your heart to beat faster (aerobic exercise) most days of the week. This may include walking, swimming, or biking. Get at least 30 minutes of exercise that strengthens your muscles (resistance exercise) at least 3 days a week. This may include lifting weights or doing Pilates. Do not smoke or use any products that contain nicotine  or tobacco. If you need help quitting, ask your doctor. Check your blood pressure at home as told by your  doctor. Keep all follow-up visits. Medicines Take over-the-counter and prescription medicines only as told by your doctor. Follow directions carefully. Do not skip doses of blood pressure medicine. The medicine does not work as well if you skip doses. Skipping doses also puts you at risk for problems. Ask your doctor about side effects or reactions to medicines that you should watch for. Contact a doctor if: You think you are having a reaction to the medicine you are taking. You have headaches that keep coming back. You feel dizzy. You have swelling in your ankles. You have trouble with your vision. Get help right away if: You get a very bad headache. You start to feel mixed up (confused). You feel weak or numb. You feel faint. You have very bad pain in your: Chest. Belly (abdomen). You vomit more than once. You have trouble breathing. These symptoms may be an emergency. Get help right away. Call 911. Do not wait to see if the symptoms will go away. Do not drive yourself to the hospital. Summary Hypertension is another name for high blood pressure. High blood pressure forces your heart to work harder to pump blood. For most people, a normal blood pressure is less than 120/80. Making healthy choices can help lower blood pressure. If your blood pressure does not get lower with healthy choices, you may need to take medicine. This information is not intended to replace advice given to you by your health care provider. Make sure you discuss any questions you have with your health care provider. Document Revised: 10/30/2020 Document Reviewed: 10/30/2020 Elsevier Patient Education  2024 Elsevier Inc.How to Take Your Blood Pressure Blood pressure measures how strongly your blood is pressing against the walls of your arteries. Arteries are blood vessels that carry blood from your heart throughout your body. You can take your blood pressure at home with a machine. You may need to check your  blood pressure at home: To check if you have high blood pressure (hypertension). To check your blood pressure over time. To make sure your blood pressure medicine is working. Supplies needed: Blood pressure machine, or monitor. A chair to sit in. This should be a chair where you can sit upright with your back supported. Do not sit on a soft couch or an armchair. Table or desk. Small notebook. Pencil or pen. How to prepare Avoid these things for 30 minutes before checking your blood pressure: Having drinks with caffeine in them, such as coffee or tea. Drinking alcohol. Eating. Smoking. Exercising. Do these things five minutes before checking your blood pressure: Go to the bathroom and pee (urinate). Sit in a chair. Be  quiet. Do not talk. How to take your blood pressure Follow the instructions that came with your machine. If you have a digital blood pressure monitor, these may be the instructions: Sit up straight. Place your feet on the floor. Do not cross your ankles or legs. Rest your left arm at the level of your heart. You may rest it on a table, desk, or chair. Pull up your shirt sleeve. Wrap the blood pressure cuff around the upper part of your left arm. The cuff should be 1 inch (2.5 cm) above your elbow. It is best to wrap the cuff around bare skin. Fit the cuff snugly around your arm, but not too tightly. You should be able to place only one finger between the cuff and your arm. Place the cord so that it rests in the bend of your elbow. Press the power button. Sit quietly while the cuff fills with air and loses air. Write down the numbers on the screen. Wait 2-3 minutes and then repeat steps 1-10. What do the numbers mean? Two numbers make up your blood pressure. The first number is called systolic pressure. The second is called diastolic pressure. An example of a blood pressure reading is 120 over 80 (or 120/80). If you are an adult and do not have a medical condition,  use this guide to find out if your blood pressure is normal: Normal First number: below 120. Second number: below 80. Elevated First number: 120-129. Second number: below 80. Hypertension stage 1 First number: 130-139. Second number: 80-89. Hypertension stage 2 First number: 140 or above. Second number: 90 or above. Your blood pressure is above normal even if only the first or only the second number is above normal. Follow these instructions at home: Medicines Take over-the-counter and prescription medicines only as told by your doctor. Tell your doctor if your medicine is causing side effects. General instructions Check your blood pressure as often as your doctor tells you to. Check your blood pressure at the same time every day. Take your monitor to your next doctor's appointment. Your doctor will: Make sure you are using it correctly. Make sure it is working right. Understand what your blood pressure numbers should be. Keep all follow-up visits. General tips You will need a blood pressure machine or monitor. Your doctor can suggest a monitor. You can buy one at a drugstore or online. When choosing one: Choose one with an arm cuff. Choose one that wraps around your upper arm. Only one finger should fit between your arm and the cuff. Do not choose one that measures your blood pressure from your wrist or finger. Where to find more information American Heart Association: www.heart.org Contact a doctor if: Your blood pressure keeps being high. Your blood pressure is suddenly low. Get help right away if: Your first blood pressure number is higher than 180. Your second blood pressure number is higher than 120. These symptoms may be an emergency. Do not wait to see if the symptoms will go away. Get help right away. Call 911. Summary Check your blood pressure at the same time every day. Avoid caffeine, alcohol, smoking, and exercise for 30 minutes before checking your blood  pressure. Make sure you understand what your blood pressure numbers should be. This information is not intended to replace advice given to you by your health care provider. Make sure you discuss any questions you have with your health care provider. Document Revised: 09/25/2020 Document Reviewed: 09/25/2020 Elsevier Patient Education  2024 Elsevier  Inc.
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
## 2023-08-25 DIAGNOSIS — R2681 Unsteadiness on feet: Secondary | ICD-10-CM | POA: Diagnosis not present

## 2023-08-25 DIAGNOSIS — M25561 Pain in right knee: Secondary | ICD-10-CM | POA: Diagnosis not present

## 2023-08-25 DIAGNOSIS — R2689 Other abnormalities of gait and mobility: Secondary | ICD-10-CM | POA: Diagnosis not present

## 2023-08-29 ENCOUNTER — Encounter: Payer: Self-pay | Admitting: Family Medicine

## 2023-08-29 ENCOUNTER — Ambulatory Visit: Admitting: Family Medicine

## 2023-08-29 VITALS — BP 146/86 | HR 70 | Resp 16 | Ht <= 58 in | Wt 80.4 lb

## 2023-08-29 DIAGNOSIS — E440 Moderate protein-calorie malnutrition: Secondary | ICD-10-CM

## 2023-08-29 DIAGNOSIS — R636 Underweight: Secondary | ICD-10-CM

## 2023-08-29 DIAGNOSIS — F411 Generalized anxiety disorder: Secondary | ICD-10-CM

## 2023-08-29 DIAGNOSIS — I10 Essential (primary) hypertension: Secondary | ICD-10-CM

## 2023-08-29 DIAGNOSIS — Z1231 Encounter for screening mammogram for malignant neoplasm of breast: Secondary | ICD-10-CM

## 2023-08-29 DIAGNOSIS — M810 Age-related osteoporosis without current pathological fracture: Secondary | ICD-10-CM | POA: Diagnosis not present

## 2023-08-29 DIAGNOSIS — E782 Mixed hyperlipidemia: Secondary | ICD-10-CM

## 2023-08-29 DIAGNOSIS — G4700 Insomnia, unspecified: Secondary | ICD-10-CM

## 2023-08-29 MED ORDER — BLOOD PRESSURE CUFF MISC: 0 refills | Status: AC

## 2023-08-29 NOTE — Assessment & Plan Note (Addendum)
 Currently not managed. Lipid levels check in 10/24 and were within normal limits. Lipid levels not currently followed as patient's focus is on weight gain. - Continue current weight gain regimen - Once at goal weight, reassess lipid status and possible medication adjustment

## 2023-08-29 NOTE — Assessment & Plan Note (Signed)
 Currently controlled with medication regimen. Denies any symptoms or medication side effects. Has not been taking home measurements due to lack of pediatric cuff. In office BP 150/81 and 146/86 with suspected lower readings at home. Will re-evaluate in 6 weeks. - Ordered pediatric cuff and encouraged home measurements - Continue Lisinopril  25 mg daily

## 2023-08-29 NOTE — Progress Notes (Signed)
 Established Patient Office Visit  Subjective   Patient ID: Amanda Tucker, female    DOB: February 16, 1945  Age: 78 y.o. MRN: [id number]  Chief Complaint  Patient presents with   Follow-up    2 month wt loss f/u ( HTN)    Amanda Tucker is a 78 yo F with a history of HTN, HLD, and malnutrition who presents to the clinic for follow up management of her malnutrition and weight.  On interview, she reports that she has been feeling well since her last appointment. She states that she thinks her blood pressure has been well controlled at home on Lisinopril , but has been unable to measure her pressures at home due to lack of a pediatric cuff. She requests another prescription for a cuff. She reports also having gained weight since her last visit. She states that the medication she received has been stimulating her appetite and helping her sleep at night. She would like to discuss her weight gain further today.  She reports no other concerns today.      ROS    Objective:     BP (!) 146/86 (BP Location: Left Arm, Patient Position: Sitting, Cuff Size: Small)   Pulse 70   Resp 16   Ht 4' 7 (1.397 m)   Wt 80 lb 6.4 oz (36.5 kg)   SpO2 98%   BMI 18.69 kg/m    Physical Exam Vitals reviewed.  Constitutional:      General: She is not in acute distress.    Appearance: Normal appearance. She is not ill-appearing or diaphoretic.  HENT:     Head: Normocephalic.     Right Ear: Tympanic membrane, ear canal and external ear normal.     Left Ear: Tympanic membrane, ear canal and external ear normal.     Nose: Nose normal.     Mouth/Throat:     Mouth: Mucous membranes are moist.     Pharynx: Oropharynx is clear. No oropharyngeal exudate or posterior oropharyngeal erythema.  Eyes:     General: No scleral icterus.    Conjunctiva/sclera: Conjunctivae normal.     Pupils: Pupils are equal, round, and reactive to light.  Cardiovascular:     Rate and Rhythm: Normal rate and regular rhythm.      Pulses: Normal pulses.     Heart sounds: Normal heart sounds. No murmur heard.    No friction rub. No gallop.  Pulmonary:     Effort: Pulmonary effort is normal. No respiratory distress.     Breath sounds: Normal breath sounds. No wheezing.  Abdominal:     General: There is no distension.     Palpations: Abdomen is soft.     Tenderness: There is no abdominal tenderness. There is no guarding.  Musculoskeletal:     Cervical back: Normal range of motion.     Right lower leg: No edema.     Left lower leg: No edema.  Lymphadenopathy:     Cervical: No cervical adenopathy.  Skin:    General: Skin is warm and dry.     Capillary Refill: Capillary refill takes less than 2 seconds.  Neurological:     Mental Status: She is alert and oriented to person, place, and time.  Psychiatric:        Mood and Affect: Mood normal.      No results found for any visits on 08/29/23.    The 10-year ASCVD risk score (Arnett DK, et al., 2019) is: 46.3%    Assessment &  Plan:   Problem List Items Addressed This Visit       Cardiovascular and Mediastinum   Hypertension   Currently controlled with medication regimen. Denies any symptoms or medication side effects. Has not been taking home measurements due to lack of pediatric cuff. In office BP 150/81 and 146/86 with suspected lower readings at home. Will re-evaluate in 6 weeks. - Ordered pediatric cuff and encouraged home measurements - Continue Lisinopril  25 mg daily        Musculoskeletal and Integument   Age-related osteoporosis without current pathological fracture   Chronic condition monitored every two years with DEXA scan. Patient taking appropriate supplementation and focused on gaining weight to help. - Discussed importance of adequate weight gain and maintenance      Relevant Orders   DG Bone Density     Other   Hyperlipidemia   Currently not managed. Lipid levels check in 10/24 and were within normal limits. Lipid levels not  currently followed as patient's focus is on weight gain. - Continue current weight gain regimen - Once at goal weight, reassess lipid status and possible medication adjustment      Generalized anxiety disorder   Current controlled with medication regimen. Endorses improvement with recent addition of mirtazapine . Current regimen includes Zoloft  and Xanax  prn. Reports taking fewer Xanax  for anxiety over the last 6 weeks - Continue Zoloft  50 mg daily - Continue Xanax  0.25 mg bid prn with goal of lowering number of refills in the future - Continue Remeron  7.5 mg daily in the evening - Reassess Remeron  efficacy in 6 weeks      Insomnia   Current managed with mirtazapine . Patient reports sleeping better at night with less use of Xanax . - Continue Remeron  in the evenings      Moderate protein-calorie malnutrition (HCC) - Primary   Currently well managed. Patient has a history of not being able to pass 80lb mark. Current weight gain over the last 6 weeks is 2.4lbs. Reports an increase in appetite since starting Remeron  with weight gain. Has been focusing diet on high-fat, calorie-dense meals. - Continue Remeron  7.5 mg daily in the evening - Discussed eating high-fat, high calorie meals - Reassess weight gain in 6 weeks      Underweight   Other Visit Diagnoses       Breast cancer screening by mammogram       Relevant Orders   MM 3D SCREENING MAMMOGRAM BILATERAL BREAST      General Health Maintenance Discussed wellness and preventative health screenings to achieve health maintenance goals. - Encouraged flu and Covid vaccinations in the fall - Discussed Shingles and tetanus vaccination at local pharmacy - Ambulatory referral for mammogram and DEXA scan - Declined lung cancer screening and colonoscopy - Up to date on other wellness measures and preventative screenings   Return in about 6 weeks (around 10/10/2023) for weight f/u, BP f/u.    Elia LULLA Blanch, Medical  Student   Patient seen along with MS3 student, Elia Blanch. I personally evaluated this patient along with the student, and verified all aspects of the history, physical exam, and medical decision making as documented by the student. I agree with the student's documentation and have made all necessary edits.  Everette Mall, Jon HERO, MD, MPH [facility]

## 2023-08-29 NOTE — Assessment & Plan Note (Signed)
 Chronic condition monitored every two years with DEXA scan. Patient taking appropriate supplementation and focused on gaining weight to help. - Discussed importance of adequate weight gain and maintenance

## 2023-08-29 NOTE — Assessment & Plan Note (Signed)
 Currently well managed. Patient has a history of not being able to pass 80lb mark. Current weight gain over the last 6 weeks is 2.4lbs. Reports an increase in appetite since starting Remeron  with weight gain. Has been focusing diet on high-fat, calorie-dense meals. - Continue Remeron  7.5 mg daily in the evening - Discussed eating high-fat, high calorie meals - Reassess weight gain in 6 weeks

## 2023-08-29 NOTE — Assessment & Plan Note (Signed)
 Current controlled with medication regimen. Endorses improvement with recent addition of mirtazapine . Current regimen includes Zoloft  and Xanax  prn. Reports taking fewer Xanax  for anxiety over the last 6 weeks - Continue Zoloft  50 mg daily - Continue Xanax  0.25 mg bid prn with goal of lowering number of refills in the future - Continue Remeron  7.5 mg daily in the evening - Reassess Remeron  efficacy in 6 weeks

## 2023-08-29 NOTE — Assessment & Plan Note (Signed)
 Current managed with mirtazapine . Patient reports sleeping better at night with less use of Xanax . - Continue Remeron  in the evenings

## 2023-08-30 DIAGNOSIS — R2681 Unsteadiness on feet: Secondary | ICD-10-CM | POA: Diagnosis not present

## 2023-08-30 DIAGNOSIS — R2689 Other abnormalities of gait and mobility: Secondary | ICD-10-CM | POA: Diagnosis not present

## 2023-08-30 DIAGNOSIS — M25561 Pain in right knee: Secondary | ICD-10-CM | POA: Diagnosis not present

## 2023-09-01 DIAGNOSIS — M25561 Pain in right knee: Secondary | ICD-10-CM | POA: Diagnosis not present

## 2023-09-01 DIAGNOSIS — R2681 Unsteadiness on feet: Secondary | ICD-10-CM | POA: Diagnosis not present

## 2023-09-01 DIAGNOSIS — R2689 Other abnormalities of gait and mobility: Secondary | ICD-10-CM | POA: Diagnosis not present

## 2023-09-05 DIAGNOSIS — R2681 Unsteadiness on feet: Secondary | ICD-10-CM | POA: Diagnosis not present

## 2023-09-05 DIAGNOSIS — R2689 Other abnormalities of gait and mobility: Secondary | ICD-10-CM | POA: Diagnosis not present

## 2023-09-05 DIAGNOSIS — M25561 Pain in right knee: Secondary | ICD-10-CM | POA: Diagnosis not present

## 2023-09-08 DIAGNOSIS — R2681 Unsteadiness on feet: Secondary | ICD-10-CM | POA: Diagnosis not present

## 2023-09-08 DIAGNOSIS — M25561 Pain in right knee: Secondary | ICD-10-CM | POA: Diagnosis not present

## 2023-09-08 DIAGNOSIS — R2689 Other abnormalities of gait and mobility: Secondary | ICD-10-CM | POA: Diagnosis not present

## 2023-09-21 ENCOUNTER — Other Ambulatory Visit: Payer: Self-pay

## 2023-09-21 NOTE — Patient Outreach (Signed)
 Complex Care Management   Visit Note  09/21/2023  Name:  Amanda Tucker MRN: [id number] DOB: January 16, 1946  Situation: Referral received for Complex Care Management related to Falls, HTN, Protein Malnutrition I obtained verbal consent from Patient.  Visit completed with Patient  on the phone  Background:   Past Medical History:  Diagnosis Date   Anxiety    Arthritis    Balance problem    Depression    Diverticulitis    Environmental and seasonal allergies    GERD (gastroesophageal reflux disease)    HOH (hard of hearing)    Hyperlipidemia    Hypertension    IBS (irritable bowel syndrome)    Osteoporosis    Right sided sciatica    Vertigo     Assessment: Patient Reported Symptoms:  Cognitive Cognitive Status: No symptoms reported Cognitive/Intellectual Conditions Management [RPT]: None reported or documented in medical history or problem list   Health Maintenance Behaviors: Annual physical exam Healing Pattern: Average Health Facilitated by: Healthy diet, Rest  Neurological Neurological Review of Symptoms: Headaches Neurological Comment: Excedrin prn for headaches  HEENT HEENT Symptoms Reported: Nasal discharge HEENT Comment: allergies - not bad enough to take allergy med    Cardiovascular Cardiovascular Symptoms Reported: No symptoms reported Weight: 83 lb (37.6 kg) Cardiovascular Comment: received BP cuff and checking  Respiratory Respiratory Symptoms Reported: No symptoms reported Additional Respiratory Details: smoking daily - 5 cigarettes more or less - aware to let us  know when ready to quit Respiratory Management Strategies: Routine screening  Endocrine Endocrine Symptoms Reported: No symptoms reported Is patient diabetic?: No    Gastrointestinal Gastrointestinal Symptoms Reported: No symptoms reported Additional Gastrointestinal Details: no IBS symptoms Gastrointestinal Management Strategies: Diet modification    Genitourinary Genitourinary Symptoms Reported:  Incontinence Genitourinary Management Strategies: Incontinence garment/pad  Integumentary Integumentary Symptoms Reported: No symptoms reported    Musculoskeletal Additional Musculoskeletal Details: gait much improved rarely off balance, not needing walker, doing exercise [employer] R at [facility] than Fri bingo Musculoskeletal Management Strategies: Exercise, Medication therapy, Routine screening Musculoskeletal Self-Management Outcome: 4 (good) Falls in the past year?: No Number of falls in past year: 1 or less Was there an injury with Fall?: No Fall Risk Category Calculator: 0 Patient Fall Risk Level: Low Fall Risk Patient at Risk for Falls Due to: Impaired balance/gait, Impaired vision Fall risk Follow up: Falls evaluation completed, Falls prevention discussed  Psychosocial Psychosocial Symptoms Reported: Anxiety - if selected complete GAD Additional Psychological Details: has not changed insurance yet - stress/anxiety Behavioral Management Strategies: Support system, Medication therapy Major Change/Loss/Stressor/Fears (CP): Denies Techniques to Cope with Loss/Stress/Change: Diversional activities, Medication Quality of Family Relationships: supportive, helpful Do you feel physically threatened by others?: No    09/21/2023    PHQ2-9 Depression Screening   Little interest or pleasure in doing things Not at all  Feeling down, depressed, or hopeless Not at all  PHQ-2 - Total Score 0  Trouble falling or staying asleep, or sleeping too much    Feeling tired or having little energy    Poor appetite or overeating     Feeling bad about yourself - or that you are a failure or have let yourself or your family down    Trouble concentrating on things, such as reading the newspaper or watching television    Moving or speaking so slowly that other people could have noticed.  Or the opposite - being so fidgety or restless that you have been moving around a lot more than usual  Thoughts that you  would be better off dead, or hurting yourself in some way    PHQ2-9 Total Score    If you checked off any problems, how difficult have these problems made it for you to do your work, take care of things at home, or get along with other people    Depression Interventions/Treatment      Vitals:   09/21/23 1447  BP: 118/82  Pulse: 81    Medications Reviewed Today     Reviewed by Devra Lands, RN (Registered Nurse) on 09/21/23 at 1443  Med List Status: <None>   Medication Order Taking? Sig Documenting Provider Last Dose Status Informant  alendronate  (FOSAMAX ) 70 MG tablet [id number] Yes TAKE 1 TABLET(70 MG) BY MOUTH 1 TIME A WEEK WITH A FULL GLASS OF WATER AND ON AN EMPTY STOMACH Myrla Jon HERO, MD  Active   ALPRAZolam  (XANAX ) 0.5 MG tablet [id number] Yes TAKE 1/2 TABLET(0.25 MG) BY MOUTH TWICE DAILY AS NEEDED FOR ANXIETY Bacigalupo, Angela M, MD  Active   aspirin EC 81 MG tablet [id number] Yes Take 81 mg daily by mouth. [provider]  Active Family Member  Aspirin-Acetaminophen-Caffeine (EXCEDRIN PO) [id number] Yes Take 2 tablets by mouth daily as needed. [provider]  Active Self  Blood Pressure Monitoring (BLOOD PRESSURE CUFF) MISC [id number] Yes Pediatric size, automatic. Use to check BP daily at home [place], Angela M, MD  Active   diclofenac Sodium (VOLTAREN ARTHRITIS PAIN) 1 % GEL [id number] Yes Apply 2 g topically 4 (four) times daily as needed. [provider]  Active Self  lisinopril  (ZESTRIL ) 40 MG tablet [id number] Yes TAKE 1 TABLET(40 MG) BY MOUTH DAILYTAKE 1 TABLET(40 MG) BY MOUTH DAILY Bacigalupo, Jon HERO, MD  Active   mirtazapine  (REMERON ) 7.5 MG tablet [id number] Yes TAKE 1 TABLET(7.5 MG) BY MOUTH AT BEDTIME Bacigalupo, Angela M, MD  Active   Multiple Vitamin (MULTIVITAMIN ADULT PO) [id number] Yes Take by mouth daily. [provider]  Active            Med Note WESTLY, MCKENZIE A   Tue Apr 01, 2020  9:51 AM) occasionally   omeprazole (PRILOSEC) 20 MG capsule [id number] Yes Take 20 mg by mouth daily. Unsure of dose [provider]  Active            Med Note DORI, DAVINA E   Wed Apr 27, 2023  9:59 AM) Patient states she takes as needed.  OVER THE COUNTER MEDICATION [id number] Yes [place] [employer] Salve Apply prn [provider]  Active Self  sertraline  (ZOLOFT ) 50 MG tablet [id number] Yes Take 1 tablet (50 mg total) by mouth daily. Bacigalupo, Angela M, MD  Active   simvastatin  (ZOCOR ) 40 MG tablet [id number] Yes TAKE 1 TABLET(40 MG) BY MOUTH DAILY AT 6 PM Bacigalupo, Angela M, MD  Active   VITAMIN D  PO [id number] Yes Take by mouth daily. [provider]  Active             Recommendation:   PCP Follow-up Continue Current Plan of Care Make appointment with Dentist and Ophthalmologist  Follow Up Plan:   Telephone follow-up in 1 month  Lands Devra, MSN, RN [facility]  [facility], [facility] RN Care Manager Direct Dial: [phone] Fax: [phone]

## 2023-09-21 NOTE — Patient Instructions (Signed)
 Visit Information  Thank you for taking time to visit with me today. Please don't hesitate to contact me if I can be of assistance to you before our next scheduled appointment.  Your next care management appointment is by telephone on 10/19/2023 at 2;00 pm  Telephone follow-up in 1 month  Please call the care guide team at [phone] if you need to cancel, schedule, or reschedule an appointment.   Please call the Suicide and Crisis Lifeline: 988 call the [place]  National Suicide Prevention Lifeline: 709-193-9094 or TTY: 531-190-1304 TTY 540-075-5426) to talk to a trained counselor call 1-800-273-TALK (toll free, 24 hour hotline) go to [facility] [address] [phone]) call 911 if you are experiencing a Mental Health or Behavioral Health Crisis or need someone to talk to.  Nestora Duos, MSN, RN [facility], [facility] RN Care Manager Direct Dial: [phone] Fax: [phone]

## 2023-10-03 ENCOUNTER — Encounter

## 2023-10-03 ENCOUNTER — Other Ambulatory Visit

## 2023-10-10 ENCOUNTER — Encounter: Payer: Self-pay | Admitting: Family Medicine

## 2023-10-10 ENCOUNTER — Ambulatory Visit: Admitting: Family Medicine

## 2023-10-10 VITALS — BP 132/83 | HR 83 | Ht <= 58 in | Wt 87.1 lb

## 2023-10-10 DIAGNOSIS — R636 Underweight: Secondary | ICD-10-CM

## 2023-10-10 DIAGNOSIS — I10 Essential (primary) hypertension: Secondary | ICD-10-CM | POA: Diagnosis not present

## 2023-10-10 DIAGNOSIS — F411 Generalized anxiety disorder: Secondary | ICD-10-CM

## 2023-10-10 DIAGNOSIS — F331 Major depressive disorder, recurrent, moderate: Secondary | ICD-10-CM

## 2023-10-10 DIAGNOSIS — E440 Moderate protein-calorie malnutrition: Secondary | ICD-10-CM

## 2023-10-10 DIAGNOSIS — Z23 Encounter for immunization: Secondary | ICD-10-CM | POA: Diagnosis not present

## 2023-10-10 MED ORDER — ALENDRONATE SODIUM 70 MG PO TABS: 70.0000 mg | ORAL_TABLET | ORAL | 3 refills | Status: AC

## 2023-10-10 MED ORDER — MIRTAZAPINE 7.5 MG PO TABS: 7.5000 mg | ORAL_TABLET | Freq: Every day | ORAL | 1 refills | Status: AC

## 2023-10-10 NOTE — Progress Notes (Signed)
 Established patient visit   Patient: Amanda Tucker   DOB: February 26, 1945   78 y.o. Female  MRN: [id number] Visit Date: 10/10/2023  Today's healthcare provider: Jon Eva, MD   Chief Complaint  Patient presents with   Medical Management of Chronic Issues   Hypertension    Patient reports taking medications as prescribed and monitoring at home. She reports no symptoms. She does smoke.    Weight Check   Subjective    HPI HPI     Hypertension    Additional comments: Patient reports taking medications as prescribed and monitoring at home. She reports no symptoms. She does smoke.       Last edited by Lilian Fitzpatrick, CMA on 10/10/2023  3:38 PM.       Discussed the use of AI scribe software for clinical note transcription with the patient, who gave verbal consent to proceed.  History of Present Illness   Amanda Tucker is a 78 year old female with moderate protein calorie malnutrition and hypertension who presents for follow-up on weight gain and blood pressure management. She is accompanied by her daughter, Silvano.  Her weight has increased from 80 pounds in early April to 87 pounds, attributed to an improved appetite, possibly due to Remeron  (mirtazapine ) 7.5 mg QHS. She enjoys chocolate ice cream and notes her clothes are becoming snugger.  She is on lisinopril  40 mg for hypertension and reports improved blood pressure readings at home using a wrist cuff, despite difficulty finding a pediatric cuff for her small arms.  She takes Zoloft  50 mg daily for anxiety, which she finds effective. She inquires about the RSV vaccine, having previously received COVID and flu vaccines, and plans to get the flu shot during this visit.         Medications: Outpatient Medications Prior to Visit  Medication Sig   ALPRAZolam  (XANAX ) 0.5 MG tablet TAKE 1/2 TABLET(0.25 MG) BY MOUTH TWICE DAILY AS NEEDED FOR ANXIETY   aspirin EC 81 MG tablet Take 81 mg daily by mouth.    Aspirin-Acetaminophen-Caffeine (EXCEDRIN PO) Take 2 tablets by mouth daily as needed.   Blood Pressure Monitoring (BLOOD PRESSURE CUFF) MISC Pediatric size, automatic. Use to check BP daily at home   diclofenac Sodium (VOLTAREN ARTHRITIS PAIN) 1 % GEL Apply 2 g topically 4 (four) times daily as needed.   lisinopril  (ZESTRIL ) 40 MG tablet TAKE 1 TABLET(40 MG) BY MOUTH DAILYTAKE 1 TABLET(40 MG) BY MOUTH DAILY   Multiple Vitamin (MULTIVITAMIN ADULT PO) Take by mouth daily.   omeprazole (PRILOSEC) 20 MG capsule Take 20 mg by mouth daily. Unsure of dose   OVER THE COUNTER MEDICATION [employer] Salve Apply prn   sertraline  (ZOLOFT ) 50 MG tablet Take 1 tablet (50 mg total) by mouth daily.   simvastatin  (ZOCOR ) 40 MG tablet TAKE 1 TABLET(40 MG) BY MOUTH DAILY AT 6 PM   VITAMIN D  PO Take by mouth daily.   [DISCONTINUED] alendronate  (FOSAMAX ) 70 MG tablet TAKE 1 TABLET(70 MG) BY MOUTH 1 TIME A WEEK WITH A FULL GLASS OF WATER AND ON AN EMPTY STOMACH   [DISCONTINUED] mirtazapine  (REMERON ) 7.5 MG tablet TAKE 1 TABLET(7.5 MG) BY MOUTH AT BEDTIME   No facility-administered medications prior to visit.    Review of Systems     Objective    BP 132/83 (BP Location: Left Arm, Patient Position: Sitting) Comment (Cuff Size): child  Pulse 83   Ht 4' 7 (1.397 m)   Wt 87 lb  1.6 oz (39.5 kg)   SpO2 98%   BMI 20.24 kg/m  BP Readings from Last 3 Encounters:  10/10/23 132/83  09/21/23 118/82  08/29/23 (!) 146/86   Wt Readings from Last 3 Encounters:  10/10/23 87 lb 1.6 oz (39.5 kg)  09/21/23 83 lb (37.6 kg)  08/29/23 80 lb 6.4 oz (36.5 kg)      Physical Exam Vitals reviewed.  Constitutional:      General: She is not in acute distress.    Appearance: Normal appearance. She is well-developed. She is not diaphoretic.  HENT:     Head: Normocephalic and atraumatic.  Eyes:     General: No scleral icterus.    Conjunctiva/sclera: Conjunctivae normal.  Neck:     Thyroid : No thyromegaly.   Cardiovascular:     Rate and Rhythm: Normal rate and regular rhythm.     Heart sounds: Normal heart sounds.  Pulmonary:     Effort: Pulmonary effort is normal. No respiratory distress.     Breath sounds: Normal breath sounds. No wheezing, rhonchi or rales.  Musculoskeletal:     Cervical back: Neck supple.     Right lower leg: No edema.     Left lower leg: No edema.  Lymphadenopathy:     Cervical: No cervical adenopathy.  Skin:    General: Skin is warm and dry.     Findings: No rash.  Neurological:     Mental Status: She is alert and oriented to person, place, and time. Mental status is at baseline.  Psychiatric:        Mood and Affect: Mood normal.        Behavior: Behavior normal.      No results found for any visits on 10/10/23.  Assessment & Plan     Problem List Items Addressed This Visit       Cardiovascular and Mediastinum   Hypertension   Blood pressure is well-controlled during the office visit, despite previous challenges with white coat hypertension. She is using a wrist blood pressure cuff at home, which is not highly accurate but provides some monitoring capability. - Continue lisinopril  40 mg daily. - Encourage continued home blood pressure monitoring with the wrist cuff.        Other   Generalized anxiety disorder   Anxiety and depressive symptoms are well-managed with current medication regimen. She is on Zoloft  50 mg daily for anxiety and mirtazapine  7.5 mg QHS, which also positively affects mood. No desire to change medications as they are effective. - Continue Zoloft  50 mg daily. - Continue mirtazapine  7.5 mg QHS.      Relevant Medications   mirtazapine  (REMERON ) 7.5 MG tablet   Moderate protein-calorie malnutrition (HCC)   Significant improvement in weight, with a gain of 7 pounds since early April, now at 87 pounds. BMI has increased to 20, indicating she is no longer underweight. Appetite has improved, likely due to mirtazapine  (Remeron ) 7.5 mg  QHS, which is also aiding in weight gain. - Continue mirtazapine  7.5 mg QHS for appetite stimulation and weight gain. - Encourage continued caloric intake, including foods like chocolate ice cream, to maintain weight gain.      Underweight   Moderate episode of recurrent major depressive disorder (HCC)   Anxiety and depressive symptoms are well-managed with current medication regimen. She is on Zoloft  50 mg daily for anxiety and mirtazapine  7.5 mg QHS, which also positively affects mood. No desire to change medications as they are effective. - Continue Zoloft  50  mg daily. - Continue mirtazapine  7.5 mg QHS.      Relevant Medications   mirtazapine  (REMERON ) 7.5 MG tablet   Other Visit Diagnoses       Immunization due    -  Primary   Relevant Orders   Flu vaccine HIGH DOSE PF(Fluzone Trivalent) (Completed)           General Health Maintenance Discussion about vitamin D  supplementation and RSV vaccination. Vitamin D  levels have been stable, and she has not been taking a dedicated vitamin D  supplement for several months. RSV vaccine is recommended for those over 60 due to its potential severity in older adults. RSV vaccine is a one-time vaccine as it does not mutate like flu and COVID. - Recommend over-the-counter vitamin D3, 1000 to 2000 units daily. - Administer flu vaccine today. - Recommend RSV vaccine, which can be administered with the COVID vaccine at a pharmacy.        Return in about 3 months (around 01/09/2024) for chronic disease f/u.       Jon Eva, MD  [facility] [phone] (phone) [phone] (fax)  [facility]

## 2023-10-10 NOTE — Assessment & Plan Note (Signed)
 Blood pressure is well-controlled during the office visit, despite previous challenges with white coat hypertension. She is using a wrist blood pressure cuff at home, which is not highly accurate but provides some monitoring capability. - Continue lisinopril  40 mg daily. - Encourage continued home blood pressure monitoring with the wrist cuff.

## 2023-10-10 NOTE — Assessment & Plan Note (Signed)
 Anxiety and depressive symptoms are well-managed with current medication regimen. She is on Zoloft  50 mg daily for anxiety and mirtazapine  7.5 mg QHS, which also positively affects mood. No desire to change medications as they are effective. - Continue Zoloft  50 mg daily. - Continue mirtazapine  7.5 mg QHS.

## 2023-10-10 NOTE — Assessment & Plan Note (Signed)
 Significant improvement in weight, with a gain of 7 pounds since early April, now at 87 pounds. BMI has increased to 20, indicating she is no longer underweight. Appetite has improved, likely due to mirtazapine  (Remeron ) 7.5 mg QHS, which is also aiding in weight gain. - Continue mirtazapine  7.5 mg QHS for appetite stimulation and weight gain. - Encourage continued caloric intake, including foods like chocolate ice cream, to maintain weight gain.

## 2023-10-19 ENCOUNTER — Telehealth

## 2023-11-02 ENCOUNTER — Other Ambulatory Visit: Payer: Self-pay

## 2023-11-02 NOTE — Patient Instructions (Signed)
 Visit Information  Thank you for taking time to visit with me today. Please don't hesitate to contact me if I can be of assistance to you before our next scheduled appointment.  Your next care management appointment is no further scheduled appointments.  Congratulations on graduation. It has been a pleasure working with you. Please contact your PCP if you need Care Management Services in the future.   Closing From: Complex Care Management.  Please call the care guide team at [phone] if you need to cancel, schedule, or reschedule an appointment.   Please call the Suicide and Crisis Lifeline: 988 call the [place]  National Suicide Prevention Lifeline: (606) 427-9682 or TTY: 605-065-3213 TTY 530-813-3216) to talk to a trained counselor call 1-800-273-TALK (toll free, 24 hour hotline) go to [facility] [address] [place] [phone]) call 911 if you are experiencing a Mental Health or Behavioral Health Crisis or need someone to talk to.  Nestora Duos, MSN, RN [facility], [facility] RN Care Manager Direct Dial: [phone] Fax: [phone]

## 2023-11-02 NOTE — Patient Outreach (Signed)
 Complex Care Management   Visit Note  11/02/2023  Name:  Amanda Tucker MRN: [id number] DOB: [birth year]  Situation: Referral received for Complex Care Management related to Malnutrition, Anxiety Falls I obtained verbal consent from Patient.  Visit completed with Patient  on the phone  Background:   Past Medical History:  Diagnosis Date   Anxiety    Arthritis    Balance problem    Depression    Diverticulitis    Environmental and seasonal allergies    GERD (gastroesophageal reflux disease)    HOH (hard of hearing)    Hyperlipidemia    Hypertension    IBS (irritable bowel syndrome)    Osteoporosis    Right sided sciatica    Vertigo     Assessment: Patient Reported Symptoms:  Cognitive Cognitive Status: No symptoms reported Cognitive/Intellectual Conditions Management [RPT]: None reported or documented in medical history or problem list   Health Maintenance Behaviors: Annual physical exam  Neurological Neurological Review of Symptoms: No symptoms reported Neurological Comment: rare use of Excedrin  HEENT HEENT Symptoms Reported: No symptoms reported HEENT Management Strategies: Routine screening    Cardiovascular Cardiovascular Symptoms Reported: No symptoms reported Cardiovascular Management Strategies: Routine screening Weight: 87 lb (39.5 kg) Cardiovascular Comment: Checking occasionally at home - normal readings per patient  Respiratory Respiratory Symptoms Reported: No symptoms reported Additional Respiratory Details: continues to smoke about  5 cigs/day    Endocrine Endocrine Symptoms Reported: Not assessed Is patient diabetic?: No    Gastrointestinal Gastrointestinal Symptoms Reported: No symptoms reported Gastrointestinal Management Strategies: Diet modification    Genitourinary Genitourinary Symptoms Reported: Incontinence Genitourinary Management Strategies: Incontinence garment/pad  Integumentary Integumentary Symptoms Reported: No symptoms reported Skin  Management Strategies: Routine screening  Musculoskeletal Additional Musculoskeletal Details: rare use of walker occasional R knee pain - uses voltaren prn, walking regularly, social activities Musculoskeletal Management Strategies: Exercise, Medication therapy, Routine screening Musculoskeletal Self-Management Outcome: 4 (good) Falls in the past year?: No Number of falls in past year: 1 or less Was there an injury with Fall?: No Fall Risk Category Calculator: 0 Patient Fall Risk Level: Low Fall Risk Fall risk Follow up: Falls evaluation completed, Falls prevention discussed  Psychosocial Psychosocial Symptoms Reported: No symptoms reported Additional Psychological Details: feeling good Behavioral Management Strategies: Support system, Medication therapy Behavioral Health Self-Management Outcome: 4 (good) Techniques to Cope with Loss/Stress/Change: Diversional activities, Medication, Exercise Quality of Family Relationships: helpful, supportive    11/02/2023    PHQ2-9 Depression Screening   Little interest or pleasure in doing things Not at all  Feeling down, depressed, or hopeless Not at all  PHQ-2 - Total Score 0  Trouble falling or staying asleep, or sleeping too much    Feeling tired or having little energy    Poor appetite or overeating     Feeling bad about yourself - or that you are a failure or have let yourself or your family down    Trouble concentrating on things, such as reading the newspaper or watching television    Moving or speaking so slowly that other people could have noticed.  Or the opposite - being so fidgety or restless that you have been moving around a lot more than usual    Thoughts that you would be better off dead, or hurting yourself in some way    PHQ2-9 Total Score    If you checked off any problems, how difficult have these problems made it for you to do your work, take care of things at  home, or get along with other people    Depression  Interventions/Treatment      There were no vitals filed for this visit.  Medications Reviewed Today     Reviewed by Devra Lands, RN (Registered Nurse) on 11/02/23 at 1136  Med List Status: <None>   Medication Order Taking? Sig Documenting Provider Last Dose Status Informant  alendronate  (FOSAMAX ) 70 MG tablet [id number] Yes Take 1 tablet (70 mg total) by mouth once a week. Take with a full glass of water on an empty stomach. Bacigalupo, Angela M, MD  Active   ALPRAZolam  (XANAX ) 0.5 MG tablet [id number] Yes TAKE 1/2 TABLET(0.25 MG) BY MOUTH TWICE DAILY AS NEEDED FOR ANXIETY Bacigalupo, Angela M, MD  Active   aspirin EC 81 MG tablet [id number] Yes Take 81 mg daily by mouth. [provider]  Active Family Member  Aspirin-Acetaminophen-Caffeine (EXCEDRIN PO) [id number] Yes Take 2 tablets by mouth daily as needed. [provider]  Active Self  Blood Pressure Monitoring (BLOOD PRESSURE CUFF) MISC [id number] Yes Pediatric size, automatic. Use to check BP daily at home [place], Angela M, MD  Active   diclofenac Sodium (VOLTAREN ARTHRITIS PAIN) 1 % GEL [id number] Yes Apply 2 g topically 4 (four) times daily as needed. [provider]  Active Self  lisinopril  (ZESTRIL ) 40 MG tablet [id number] Yes TAKE 1 TABLET(40 MG) BY MOUTH DAILYTAKE 1 TABLET(40 MG) BY MOUTH DAILY Bacigalupo, Jon HERO, MD  Active   mirtazapine  (REMERON ) 7.5 MG tablet [id number] Yes Take 1 tablet (7.5 mg total) by mouth at bedtime. Bacigalupo, Angela M, MD  Active   Multiple Vitamin (MULTIVITAMIN ADULT PO) [id number] Yes Take by mouth daily. [provider]  Active            Med Note WESTLY, MCKENZIE A   Tue Apr 01, 2020  9:51 AM) occasionally  omeprazole (PRILOSEC) 20 MG capsule [id number] Yes Take 20 mg by mouth daily. Unsure of dose [provider]  Active            Med Note DORI, DAVINA E   Wed Apr 27, 2023  9:59 AM) Patient states she takes as needed.  OVER THE COUNTER MEDICATION  [id number]  [facility] [place] Apply prn  Patient not taking: Reported on 11/02/2023   [provider]  Active Self  sertraline  (ZOLOFT ) 50 MG tablet [id number] Yes Take 1 tablet (50 mg total) by mouth daily. Bacigalupo, Angela M, MD  Active   simvastatin  (ZOCOR ) 40 MG tablet [id number] Yes TAKE 1 TABLET(40 MG) BY MOUTH DAILY AT 6 PM Bacigalupo, Angela M, MD  Active   VITAMIN D  PO [id number] Yes Take by mouth daily. [provider]  Active             Recommendation:   PCP Follow-up Continue Current Plan of Care  Follow Up Plan:   Lands Devra, MSN, RN [facility]  [facility], [facility] RN Care Manager Direct Dial: [phone] Fax: [phone]

## 2023-11-06 ENCOUNTER — Other Ambulatory Visit: Payer: Self-pay | Admitting: Family Medicine

## 2024-01-10 ENCOUNTER — Ambulatory Visit: Admitting: Family Medicine

## 2024-01-10 ENCOUNTER — Other Ambulatory Visit: Payer: Self-pay | Admitting: Family Medicine

## 2024-01-10 DIAGNOSIS — I10 Essential (primary) hypertension: Secondary | ICD-10-CM

## 2024-01-10 NOTE — Telephone Encounter (Unsigned)
 Copied from CRM #[id number]. Topic: Clinical - Medication Refill >> Jan 10, 2024 10:04 AM China J wrote: Medication:  lisinopril  (ZESTRIL ) 40 MG tablet ALPRAZolam  (XANAX ) 0.5 MG tablet simvastatin  (ZOCOR ) 40 MG tablet  Has the patient contacted their pharmacy? No (Agent: If no, request that the patient contact the pharmacy for the refill. If patient does not wish to contact the pharmacy document the reason why and proceed with request.) (Agent: If yes, when and what did the pharmacy advise?)  This is the patient's preferred pharmacy:  [id number] - [place], [place] - [address] AT [facility] OF [address] [facility]TH [address] [place] [address] Phone: [phone] Fax: [phone]  Is this the correct pharmacy for this prescription? Yes If no, delete pharmacy and type the correct one.   Has the prescription been filled recently? No  Is the patient out of the medication? Yes  Has the patient been seen for an appointment in the last year OR does the patient have an upcoming appointment? Yes  Can we respond through MyChart? No  Agent: Please be advised that Rx refills may take up to 3 business days. We ask that you follow-up with your pharmacy.

## 2024-01-12 ENCOUNTER — Ambulatory Visit: Admitting: Family Medicine

## 2024-01-13 MED ORDER — LISINOPRIL 40 MG PO TABS: ORAL_TABLET | ORAL | 1 refills | Status: AC

## 2024-01-13 MED ORDER — SIMVASTATIN 40 MG PO TABS: 40.0000 mg | ORAL_TABLET | Freq: Every day | ORAL | 1 refills | Status: AC

## 2024-01-13 MED ORDER — ALPRAZOLAM 0.5 MG PO TABS: ORAL_TABLET | ORAL | 0 refills | Status: DC

## 2024-01-13 NOTE — Telephone Encounter (Signed)
 Copied from CRM #[id number]. Topic: Clinical - Medication Refill >> Jan 13, 2024  8:39 AM Ivette P wrote: Pt called in about her medication   lisinopril  (ZESTRIL ) 40 MG tablet  ALPRAZolam  (XANAX ) 0.5 MG tablet  simvastatin  (ZOCOR ) 40 MG tablet   Would like to know if can be refilled today before end of day. Today marks 3rd day of request. Pt said she is completely out.   Pls follow up [phone]

## 2024-01-13 NOTE — Telephone Encounter (Signed)
 Requested medication (s) are due for refill today - yes  Requested medication (s) are on the active medication list -yes  Future visit scheduled -yes  Last refill: 11/09/23 #30 1RF  Notes to clinic: non delegated Rx  Requested Prescriptions  Pending Prescriptions Disp Refills   ALPRAZolam  (XANAX ) 0.5 MG tablet 30 tablet 1    Sig: TAKE 1/2 TABLET(0.25 MG) BY MOUTH TWICE DAILY AS NEEDED FOR ANXIETY. MUST LAST 30 DAYS     Not Delegated - Psychiatry: Anxiolytics/Hypnotics 2 Failed - 01/13/2024  9:51 AM      Failed - This refill cannot be delegated      Failed - Urine Drug Screen completed in last 360 days      Passed - Patient is not pregnant      Passed - Valid encounter within last 6 months    Recent Outpatient Visits           3 months ago Immunization due   [facility] [place], Jon HERO, MD   4 months ago Moderate protein-calorie malnutrition   [facility] [place], Jon HERO, MD   8 months ago Primary hypertension   [place] [facility] [place], Jon HERO, MD   9 months ago Imbalance   [facility] [place], Jon HERO, MD              Signed Prescriptions Disp Refills   lisinopril  (ZESTRIL ) 40 MG tablet 90 tablet 1    Sig: TAKE 1 TABLET(40 MG) BY MOUTH DAILYTAKE 1 TABLET(40 MG) BY MOUTH DAILY     Cardiovascular:  ACE Inhibitors Failed - 01/13/2024  9:51 AM      Failed - Cr in normal range and within 180 days    Creat  Date Value Ref Range Status  12/14/2016 0.62 0.60 - 0.93 mg/dL Final    Comment:    For patients >22 years of age, the reference limit for Creatinine is approximately 13% higher for people identified as African-American. .    Creatinine, Ser  Date Value Ref Range Status  05/17/2023 0.51 (L) 0.57 - 1.00 mg/dL Final         Failed - K in normal range and within 180 days    Potassium  Date Value Ref Range Status  05/17/2023 3.8 3.5 -  5.2 mmol/L Final         Passed - Patient is not pregnant      Passed - Last BP in normal range    BP Readings from Last 1 Encounters:  10/10/23 132/83         Passed - Valid encounter within last 6 months    Recent Outpatient Visits           3 months ago Immunization due   [facility] [place], Jon HERO, MD   4 months ago Moderate protein-calorie malnutrition   [facility] [place], Jon HERO, MD   8 months ago Primary hypertension   [place] [facility] [place], Jon HERO, MD   9 months ago Imbalance   [facility] [place], Jon HERO, MD               simvastatin  (ZOCOR ) 40 MG tablet 90 tablet 1    Sig: Take 1 tablet (40 mg total) by mouth daily at 6 PM.     Cardiovascular:  Antilipid - Statins Failed - 01/13/2024  9:51 AM  Failed - Lipid Panel in normal range within the last 12 months    Cholesterol, Total  Date Value Ref Range Status  11/16/2022 167 100 - 199 mg/dL Final   LDL Cholesterol (Calc)  Date Value Ref Range Status  12/14/2016 97 mg/dL (calc) Final    Comment:    Reference range: <100 . Desirable range <100 mg/dL for primary prevention;   <70 mg/dL for patients with CHD or diabetic patients  with > or = 2 CHD risk factors. SABRA LDL-C is now calculated using the Martin-Hopkins  calculation, which is a validated novel method providing  better accuracy than the Friedewald equation in the  estimation of LDL-C.  Gladis APPLETHWAITE et al. SANDREA. 7986;689(80): 2061-2068  (http://education.[employer].com/faq/FAQ164)    LDL Chol Calc (NIH)  Date Value Ref Range Status  11/16/2022 78 0 - 99 mg/dL Final   HDL  Date Value Ref Range Status  11/16/2022 71 >39 mg/dL Final   Triglycerides  Date Value Ref Range Status  11/16/2022 98 0 - 149 mg/dL Final         Passed - Patient is not pregnant      Passed - Valid encounter within last 12 months     Recent Outpatient Visits           3 months ago Immunization due   [facility] [place], Jon HERO, MD   4 months ago Moderate protein-calorie malnutrition   [place] [facility] [place], Jon HERO, MD   8 months ago Primary hypertension   [facility] [place], Jon HERO, MD   9 months ago Imbalance   [facility] [place] Jon HERO, MD                 Requested Prescriptions  Pending Prescriptions Disp Refills   ALPRAZolam  (XANAX ) 0.5 MG tablet 30 tablet 1    Sig: TAKE 1/2 TABLET(0.25 MG) BY MOUTH TWICE DAILY AS NEEDED FOR ANXIETY. MUST LAST 30 DAYS     Not Delegated - Psychiatry: Anxiolytics/Hypnotics 2 Failed - 01/13/2024  9:51 AM      Failed - This refill cannot be delegated      Failed - Urine Drug Screen completed in last 360 days      Passed - Patient is not pregnant      Passed - Valid encounter within last 6 months    Recent Outpatient Visits           3 months ago Immunization due   [facility] [place], Jon HERO, MD   4 months ago Moderate protein-calorie malnutrition   [facility] [place], Jon HERO, MD   8 months ago Primary hypertension   [facility] [place], Jon HERO, MD   9 months ago Imbalance   [facility] [place], Jon HERO, MD              Signed Prescriptions Disp Refills   lisinopril  (ZESTRIL ) 40 MG tablet 90 tablet 1    Sig: TAKE 1 TABLET(40 MG) BY MOUTH DAILYTAKE 1 TABLET(40 MG) BY MOUTH DAILY     Cardiovascular:  ACE Inhibitors Failed - 01/13/2024  9:51 AM      Failed - Cr in normal range and within 180 days    Creat  Date Value Ref Range Status  12/14/2016 0.62 0.60 - 0.93 mg/dL Final    Comment:    For patients >49  years of age, the reference limit for Creatinine is approximately 13% higher for  people identified as African-American. .    Creatinine, Ser  Date Value Ref Range Status  05/17/2023 0.51 (L) 0.57 - 1.00 mg/dL Final         Failed - K in normal range and within 180 days    Potassium  Date Value Ref Range Status  05/17/2023 3.8 3.5 - 5.2 mmol/L Final         Passed - Patient is not pregnant      Passed - Last BP in normal range    BP Readings from Last 1 Encounters:  10/10/23 132/83         Passed - Valid encounter within last 6 months    Recent Outpatient Visits           3 months ago Immunization due   [facility] [place], Jon HERO, MD   4 months ago Moderate protein-calorie malnutrition   [facility] [place], Jon HERO, MD   8 months ago Primary hypertension   [place] [facility] [place], Jon HERO, MD   9 months ago Imbalance   [facility] [place], Jon HERO, MD               simvastatin  (ZOCOR ) 40 MG tablet 90 tablet 1    Sig: Take 1 tablet (40 mg total) by mouth daily at 6 PM.     Cardiovascular:  Antilipid - Statins Failed - 01/13/2024  9:51 AM      Failed - Lipid Panel in normal range within the last 12 months    Cholesterol, Total  Date Value Ref Range Status  11/16/2022 167 100 - 199 mg/dL Final   LDL Cholesterol (Calc)  Date Value Ref Range Status  12/14/2016 97 mg/dL (calc) Final    Comment:    Reference range: <100 . Desirable range <100 mg/dL for primary prevention;   <70 mg/dL for patients with CHD or diabetic patients  with > or = 2 CHD risk factors. SABRA LDL-C is now calculated using the Martin-Hopkins  calculation, which is a validated novel method providing  better accuracy than the Friedewald equation in the  estimation of LDL-C.  Gladis APPLETHWAITE et al. SANDREA. 7986;689(80): 2061-2068  (http://education.[facility].com/faq/FAQ164)    LDL Chol Calc (NIH)  Date Value Ref Range Status  11/16/2022 78 0 - 99  mg/dL Final   HDL  Date Value Ref Range Status  11/16/2022 71 >39 mg/dL Final   Triglycerides  Date Value Ref Range Status  11/16/2022 98 0 - 149 mg/dL Final         Passed - Patient is not pregnant      Passed - Valid encounter within last 12 months    Recent Outpatient Visits           3 months ago Immunization due   [facility] [place], Jon HERO, MD   4 months ago Moderate protein-calorie malnutrition   [facility] [place], Jon HERO, MD   8 months ago Primary hypertension   [place] [facility] [place], Jon HERO, MD   9 months ago Imbalance    [facility] [place], Jon HERO, MD

## 2024-01-24 ENCOUNTER — Ambulatory Visit: Admitting: Family Medicine

## 2024-02-06 ENCOUNTER — Other Ambulatory Visit: Payer: Self-pay | Admitting: Family Medicine

## 2024-02-06 ENCOUNTER — Telehealth: Payer: Self-pay | Admitting: Family Medicine

## 2024-02-06 ENCOUNTER — Other Ambulatory Visit: Payer: Self-pay

## 2024-02-06 NOTE — Telephone Encounter (Signed)
 Pt has enough med til last to March. Requested refill too soon.

## 2024-02-06 NOTE — Telephone Encounter (Signed)
 [facility] for the following medications:   mirtazapine  (REMERON ) 7.5 MG tablet    Please advise.

## 2024-02-17 ENCOUNTER — Other Ambulatory Visit: Payer: Self-pay | Admitting: Family Medicine

## 2024-02-17 DIAGNOSIS — F411 Generalized anxiety disorder: Secondary | ICD-10-CM

## 2024-02-17 NOTE — Telephone Encounter (Signed)
 Requested medication (s) are due for refill today: yes  Requested medication (s) are on the active medication list: yes  Last refill:  01/13/24  Future visit scheduled: yes  Notes to clinic:  Unable to refill per protocol, cannot delegate.      Requested Prescriptions  Pending Prescriptions Disp Refills   ALPRAZolam  (XANAX ) 0.5 MG tablet 30 tablet 0    Sig: TAKE 1/2 TABLET(0.25 MG) BY MOUTH TWICE DAILY AS NEEDED FOR ANXIETY. MUST LAST 30 DAYS     Not Delegated - Psychiatry: Anxiolytics/Hypnotics 2 Failed - 02/17/2024  2:10 PM      Failed - This refill cannot be delegated      Failed - Urine Drug Screen completed in last 360 days      Passed - Patient is not pregnant      Passed - Valid encounter within last 6 months    Recent Outpatient Visits           4 months ago Immunization due   [facility] [place], Jon HERO, MD   5 months ago Moderate protein-calorie malnutrition   [place] [place], Jon HERO, MD   9 months ago Primary hypertension   [place] [place], Jon HERO, MD   11 months ago Imbalance   [place] [facility] [place], Jon HERO, MD

## 2024-02-17 NOTE — Telephone Encounter (Signed)
 Copied from CRM [id number]. Topic: Clinical - Medication Refill >> Feb 17, 2024 12:25 PM Santiya F wrote: Medication: ALPRAZolam  (XANAX ) 0.5 MG tablet [[id number]]  Has the patient contacted their pharmacy? Yes  (Agent: If yes, when and what did the pharmacy advise?) contact office, no refills.   This is the patient's preferred pharmacy:  [id number] - [place], [place] - [address] AT [facility] [address] [facility] [address] [place] [address] Phone: [phone] Fax: [phone]  Is this the correct pharmacy for this prescription? Yes If no, delete pharmacy and type the correct one.   Has the prescription been filled recently? Yes  Is the patient out of the medication? No, has a half of tablet left   Has the patient been seen for an appointment in the last year OR does the patient have an upcoming appointment? Yes  Can we respond through MyChart? No  Agent: Please be advised that Rx refills may take up to 3 business days. We ask that you follow-up with your pharmacy.

## 2024-02-21 MED ORDER — ALPRAZOLAM 0.5 MG PO TABS: ORAL_TABLET | ORAL | 1 refills | Status: AC

## 2024-02-27 ENCOUNTER — Ambulatory Visit: Admitting: Family Medicine

## 2024-03-29 ENCOUNTER — Ambulatory Visit: Admitting: Family Medicine

## 2024-04-18 ENCOUNTER — Ambulatory Visit
